# Patient Record
Sex: Female | Born: 1995 | Hispanic: Yes | Marital: Single | Smoking: Former smoker
Health system: Southern US, Community
[De-identification: ages and names within clinical notes are randomized; demographics above are authoritative.]

## PROBLEM LIST (undated history)

## (undated) ENCOUNTER — Inpatient Hospital Stay: Admission: EM | Payer: Self-pay

## (undated) DIAGNOSIS — Z8041 Family history of malignant neoplasm of ovary: Secondary | ICD-10-CM

## (undated) DIAGNOSIS — R569 Unspecified convulsions: Secondary | ICD-10-CM

## (undated) DIAGNOSIS — O24419 Gestational diabetes mellitus in pregnancy, unspecified control: Secondary | ICD-10-CM

## (undated) DIAGNOSIS — I10 Essential (primary) hypertension: Secondary | ICD-10-CM

## (undated) DIAGNOSIS — Z789 Other specified health status: Secondary | ICD-10-CM

## (undated) DIAGNOSIS — R55 Syncope and collapse: Secondary | ICD-10-CM

## (undated) DIAGNOSIS — G932 Benign intracranial hypertension: Secondary | ICD-10-CM

## (undated) HISTORY — PX: FOOT SURGERY: SHX648

## (undated) HISTORY — DX: Gestational diabetes mellitus in pregnancy, unspecified control: O24.419

## (undated) HISTORY — DX: Other specified health status: Z78.9

## (undated) HISTORY — PX: ABDOMINAL SURGERY: SHX537

## (undated) HISTORY — DX: Unspecified convulsions: R56.9

## (undated) HISTORY — DX: Essential (primary) hypertension: I10

## (undated) HISTORY — PX: FOOT GANGLION EXCISION: SHX1660

## (undated) HISTORY — DX: Family history of malignant neoplasm of ovary: Z80.41

---

## 2017-06-04 ENCOUNTER — Encounter: Payer: Self-pay | Admitting: Emergency Medicine

## 2017-06-04 ENCOUNTER — Other Ambulatory Visit: Payer: Self-pay

## 2017-06-04 DIAGNOSIS — N6452 Nipple discharge: Secondary | ICD-10-CM | POA: Insufficient documentation

## 2017-06-04 DIAGNOSIS — N611 Abscess of the breast and nipple: Secondary | ICD-10-CM | POA: Insufficient documentation

## 2017-06-04 DIAGNOSIS — F17210 Nicotine dependence, cigarettes, uncomplicated: Secondary | ICD-10-CM | POA: Insufficient documentation

## 2017-06-04 DIAGNOSIS — R112 Nausea with vomiting, unspecified: Secondary | ICD-10-CM | POA: Insufficient documentation

## 2017-06-04 LAB — URINALYSIS, COMPLETE (UACMP) WITH MICROSCOPIC
BACTERIA UA: NONE SEEN
Bilirubin Urine: NEGATIVE
Glucose, UA: NEGATIVE mg/dL
HGB URINE DIPSTICK: NEGATIVE
Ketones, ur: NEGATIVE mg/dL
LEUKOCYTES UA: NEGATIVE
Nitrite: NEGATIVE
PROTEIN: NEGATIVE mg/dL
Specific Gravity, Urine: 1.003 — ABNORMAL LOW (ref 1.005–1.030)
pH: 6 (ref 5.0–8.0)

## 2017-06-04 LAB — CBC
HEMATOCRIT: 40.4 % (ref 35.0–47.0)
Hemoglobin: 13.8 g/dL (ref 12.0–16.0)
MCH: 30.2 pg (ref 26.0–34.0)
MCHC: 34 g/dL (ref 32.0–36.0)
MCV: 88.8 fL (ref 80.0–100.0)
Platelets: 317 10*3/uL (ref 150–440)
RBC: 4.55 MIL/uL (ref 3.80–5.20)
RDW: 12.7 % (ref 11.5–14.5)
WBC: 12.4 10*3/uL — AB (ref 3.6–11.0)

## 2017-06-04 LAB — COMPREHENSIVE METABOLIC PANEL
ALBUMIN: 4.3 g/dL (ref 3.5–5.0)
ALT: 14 U/L (ref 14–54)
AST: 19 U/L (ref 15–41)
Alkaline Phosphatase: 86 U/L (ref 38–126)
Anion gap: 7 (ref 5–15)
BILIRUBIN TOTAL: 0.4 mg/dL (ref 0.3–1.2)
BUN: 15 mg/dL (ref 6–20)
CHLORIDE: 102 mmol/L (ref 101–111)
CO2: 27 mmol/L (ref 22–32)
Calcium: 8.8 mg/dL — ABNORMAL LOW (ref 8.9–10.3)
Creatinine, Ser: 0.55 mg/dL (ref 0.44–1.00)
GFR calc Af Amer: >60 mL/min (ref >60)
GFR calc non Af Amer: >60 mL/min (ref >60)
GLUCOSE: 105 mg/dL — AB (ref 65–99)
Potassium: 4 mmol/L (ref 3.5–5.1)
Sodium: 136 mmol/L (ref 135–145)
TOTAL PROTEIN: 7.7 g/dL (ref 6.5–8.1)

## 2017-06-04 LAB — POCT PREGNANCY, URINE: Preg Test, Ur: NEGATIVE

## 2017-06-04 NOTE — ED Triage Notes (Addendum)
Patient ambulatory to triage with steady gait, without difficulty or distress noted; began having heavy vag menstrual bleeding on Monday with cramping; st periods are irregular

## 2017-06-05 ENCOUNTER — Emergency Department
Admission: EM | Admit: 2017-06-05 | Discharge: 2017-06-05 | Disposition: A | Discharge status: Home / Self Care | Payer: Self-pay | Attending: Emergency Medicine | Admitting: Emergency Medicine

## 2017-06-05 ENCOUNTER — Emergency Department: Payer: Self-pay

## 2017-06-05 DIAGNOSIS — N6452 Nipple discharge: Secondary | ICD-10-CM

## 2017-06-05 DIAGNOSIS — R112 Nausea with vomiting, unspecified: Secondary | ICD-10-CM

## 2017-06-05 DIAGNOSIS — N61 Mastitis without abscess: Secondary | ICD-10-CM

## 2017-06-05 DIAGNOSIS — R109 Unspecified abdominal pain: Secondary | ICD-10-CM

## 2017-06-05 LAB — LIPASE, BLOOD: Lipase: 25 U/L (ref 11–51)

## 2017-06-05 MED ORDER — ONDANSETRON 4 MG PO TBDP: 4.0000 mg | ORAL_TABLET | Freq: Three times a day (TID) | ORAL | 0 refills | Status: DC | PRN

## 2017-06-05 MED ORDER — AMOXICILLIN-POT CLAVULANATE 875-125 MG PO TABS: 1.0000 | ORAL_TABLET | Freq: Two times a day (BID) | ORAL | 0 refills | Status: AC

## 2017-06-05 MED ORDER — SODIUM CHLORIDE 0.9 % IV BOLUS
1000.0000 mL | Freq: Once | INTRAVENOUS | Status: AC
  Administered 2017-06-05: 1000 mL via INTRAVENOUS

## 2017-06-05 MED ORDER — AMOXICILLIN-POT CLAVULANATE 875-125 MG PO TABS
1.0000 | ORAL_TABLET | Freq: Once | ORAL | Status: AC
  Administered 2017-06-05: 1 via ORAL
  Filled 2017-06-05: qty 1

## 2017-06-05 MED ORDER — ONDANSETRON HCL 4 MG/2ML IJ SOLN
4.0000 mg | Freq: Once | INTRAMUSCULAR | Status: AC
  Administered 2017-06-05: 4 mg via INTRAVENOUS
  Filled 2017-06-05: qty 2

## 2017-06-05 NOTE — ED Notes (Signed)
Patient transported to Ultrasound 

## 2017-06-05 NOTE — ED Provider Notes (Signed)
[facility] Emergency Department Provider Note   ____________________________________________   First MD Initiated Contact with Patient 06/05/17 0131     (approximate)  I have reviewed the triage vital signs and the nursing notes.   HISTORY  Chief Complaint Vaginal Bleeding    HPI Audrey Munoz is a 22 y.o. female who comes into the hospital today with some left breast discharge.  She states that she got her menstrual cycle on Monday.  It was very heavy and the patient states she had pain around her ovaries.  She had the symptoms for 2 days and then started having vomiting and shortness of breath on Wednesday which was 2 days ago.  The bleeding continued.  Today the bleeding stopped but the patient started having some discharge from her left breast.  It looked like water and a little greenish.  The patient states that her breast is hard.  She has been pregnant before.  The patient denies any more abdominal pain and again she is no longer bleeding.  She does not have an OB/GYN.  She denies fevers.  The patient has had cyst on her ovaries but does not know which side.  She is feeling fatigued and she did vomit in the waiting room as well.  Her last menstrual period was in March but she does have a regular cycle.  The patient is here today for evaluation  History reviewed. No pertinent past medical history.  There are no active problems to display for this patient.   Past Surgical History:  Procedure Laterality Date  . FOOT SURGERY      Prior to Admission medications   Medication Sig Start Date End Date Taking? Authorizing Provider  amoxicillin-clavulanate (AUGMENTIN) 875-125 MG tablet Take 1 tablet by mouth 2 (two) times daily for 7 days. 06/05/17 06/12/17  Rebecka Apley, MD  ondansetron (ZOFRAN ODT) 4 MG disintegrating tablet Take 1 tablet (4 mg total) by mouth every 8 (eight) hours as needed for nausea or vomiting. 06/05/17   Rebecka Apley, MD      Allergies Patient has no known allergies.  No family history on file.  Social History Social History   Tobacco Use  . Smoking status: Current Every Day Smoker  . Smokeless tobacco: Never Used  Substance Use Topics  . Alcohol use: Not on file  . Drug use: Not on file    Review of Systems  Constitutional: No fever/chills Eyes: No visual changes. ENT: No sore throat. Cardiovascular: Denies chest pain. Respiratory: Denies shortness of breath. Gastrointestinal:  abdominal pain, nausea, vomiting.  No diarrhea.  No constipation. Genitourinary: vaginal bleeding Musculoskeletal: Negative for back pain. Skin: left breast discharge Neurological: Negative for headaches, focal weakness or numbness.   ____________________________________________   PHYSICAL EXAM:  VITAL SIGNS: ED Triage Vitals  Enc Vitals Group     BP 06/04/17 2039 (!) 142/75     Pulse Rate 06/04/17 2039 83     Resp 06/05/17 0203 18     Temp 06/04/17 2039 98.2 F (36.8 C)     Temp Source 06/04/17 2039 Oral     SpO2 06/04/17 2039 98 %     Weight 06/04/17 2039 140 lb (63.5 kg)     Height 06/04/17 2039 5' (1.524 m)     Head Circumference --      Peak Flow --      Pain Score 06/04/17 2039 4     Pain Loc --  Pain Edu? --      Excl. in GC? --     Constitutional: Alert and oriented. Well appearing and in mild distress. Eyes: Conjunctivae are normal. PERRL. EOMI. Head: Atraumatic. Nose: No congestion/rhinnorhea. Mouth/Throat: Mucous membranes are moist.  Oropharynx non-erythematous. Cardiovascular: Normal rate, regular rhythm. Grossly normal heart sounds.  Good peripheral circulation. Respiratory: Normal respiratory effort.  No retractions. Lungs CTAB. Gastrointestinal: Soft and nontender. No distention.  Positive bowel sounds Musculoskeletal: No lower extremity tenderness nor edema.   Neurologic:  Normal speech and language.  Skin:  Skin is warm, dry and intact. No discharge expressed from left  breast, no masses palpated bilaterally Psychiatric: Mood and affect are normal.   ____________________________________________   LABS (all labs ordered are listed, but only abnormal results are displayed)  Labs Reviewed  URINALYSIS, COMPLETE (UACMP) WITH MICROSCOPIC - Abnormal; Notable for the following components:      Result Value   Color, Urine COLORLESS (*)    APPearance CLEAR (*)    Specific Gravity, Urine 1.003 (*)    All other components within normal limits  CBC - Abnormal; Notable for the following components:   WBC 12.4 (*)    All other components within normal limits  COMPREHENSIVE METABOLIC PANEL - Abnormal; Notable for the following components:   Glucose, Bld 105 (*)    Calcium 8.8 (*)    All other components within normal limits  LIPASE, BLOOD  POCT PREGNANCY, URINE   ____________________________________________  EKG  none ____________________________________________  RADIOLOGY  ED MD interpretation:  US pelvis: Normal pelvic ultrasound, no evidence for torsion or other acute abnormality  Official radiology report(s): US Pelvis Transvanginal Non-ob (tv Only)  Result Date: 06/05/2017 CLINICAL DATA:  Initial evaluation for acute lower abdominal pain with cramping, vaginal bleeding. EXAM: TRANSABDOMINAL AND TRANSVAGINAL ULTRASOUND OF PELVIS DOPPLER ULTRASOUND OF OVARIES TECHNIQUE: Both transabdominal and transvaginal ultrasound examinations of the pelvis were performed. Transabdominal technique was performed for global imaging of the pelvis including uterus, ovaries, adnexal regions, and pelvic cul-de-sac. It was necessary to proceed with endovaginal exam following the transabdominal exam to visualize the uterus, endometrium, and ovaries. Color and duplex Doppler ultrasound was utilized to evaluate blood flow to the ovaries. COMPARISON:  None. FINDINGS: Uterus Measurements: 6.2 x 3.4 x 4.2 cm. No fibroids or other mass visualized. Endometrium Thickness: 3.6 mm.  No  focal abnormality visualized. Right ovary Measurements: 2.7 x 1.8 x 2.9 cm. Normal appearance/no adnexal mass. Left ovary Measurements: 3.7 x 2.3 x 2.8 cm. Normal appearance/no adnexal mass. Pulsed Doppler evaluation of both ovaries demonstrates normal low-resistance arterial and venous waveforms. Other findings No abnormal free fluid. IMPRESSION: Normal pelvic ultrasound. No evidence for torsion or other acute abnormality. Electronically Signed   By: Rise Mu M.D.   On: 06/05/2017 03:55   US Pelvis Complete  Result Date: 06/05/2017 CLINICAL DATA:  Initial evaluation for acute lower abdominal pain with cramping, vaginal bleeding. EXAM: TRANSABDOMINAL AND TRANSVAGINAL ULTRASOUND OF PELVIS DOPPLER ULTRASOUND OF OVARIES TECHNIQUE: Both transabdominal and transvaginal ultrasound examinations of the pelvis were performed. Transabdominal technique was performed for global imaging of the pelvis including uterus, ovaries, adnexal regions, and pelvic cul-de-sac. It was necessary to proceed with endovaginal exam following the transabdominal exam to visualize the uterus, endometrium, and ovaries. Color and duplex Doppler ultrasound was utilized to evaluate blood flow to the ovaries. COMPARISON:  None. FINDINGS: Uterus Measurements: 6.2 x 3.4 x 4.2 cm. No fibroids or other mass visualized. Endometrium Thickness: 3.6 mm.  No focal  abnormality visualized. Right ovary Measurements: 2.7 x 1.8 x 2.9 cm. Normal appearance/no adnexal mass. Left ovary Measurements: 3.7 x 2.3 x 2.8 cm. Normal appearance/no adnexal mass. Pulsed Doppler evaluation of both ovaries demonstrates normal low-resistance arterial and venous waveforms. Other findings No abnormal free fluid. IMPRESSION: Normal pelvic ultrasound. No evidence for torsion or other acute abnormality. Electronically Signed   By: Rise Mu M.D.   On: 06/05/2017 03:55   US Abdominal Pelvic Art/vent Flow Doppler  Result Date: 06/05/2017 CLINICAL DATA:   Initial evaluation for acute lower abdominal pain with cramping, vaginal bleeding. EXAM: TRANSABDOMINAL AND TRANSVAGINAL ULTRASOUND OF PELVIS DOPPLER ULTRASOUND OF OVARIES TECHNIQUE: Both transabdominal and transvaginal ultrasound examinations of the pelvis were performed. Transabdominal technique was performed for global imaging of the pelvis including uterus, ovaries, adnexal regions, and pelvic cul-de-sac. It was necessary to proceed with endovaginal exam following the transabdominal exam to visualize the uterus, endometrium, and ovaries. Color and duplex Doppler ultrasound was utilized to evaluate blood flow to the ovaries. COMPARISON:  None. FINDINGS: Uterus Measurements: 6.2 x 3.4 x 4.2 cm. No fibroids or other mass visualized. Endometrium Thickness: 3.6 mm.  No focal abnormality visualized. Right ovary Measurements: 2.7 x 1.8 x 2.9 cm. Normal appearance/no adnexal mass. Left ovary Measurements: 3.7 x 2.3 x 2.8 cm. Normal appearance/no adnexal mass. Pulsed Doppler evaluation of both ovaries demonstrates normal low-resistance arterial and venous waveforms. Other findings No abnormal free fluid. IMPRESSION: Normal pelvic ultrasound. No evidence for torsion or other acute abnormality. Electronically Signed   By: Rise Mu M.D.   On: 06/05/2017 03:55    ____________________________________________   PROCEDURES  Procedure(s) performed: None  Procedures  Critical Care performed: No  ____________________________________________   INITIAL IMPRESSION / ASSESSMENT AND PLAN / ED COURSE  As part of my medical decision making, I reviewed the following data within the electronic MEDICAL RECORD NUMBER Notes from prior ED visits and [place] Controlled Substance Database   This is a 22 year old female who comes into the hospital today with discharge from her left breast as well as some remote abdominal pain and vaginal bleeding.  The patient is also having nausea and vomiting.  My differential  diagnosis includes ovarian cyst, infection of the breast  We did check some blood work on the patient and the patient's white blood cell count is 12.4 but the remaining CBC CMP and urinalysis are unremarkable.  The patient's pregnancy is negative.  I will send her for an ultrasound of her pelvis looking for cysts.  She will be reassessed she is not in any pain at this time.  We did not perform a pelvic exam as the pain was improved and she no longer had any bleeding.  The patient's ultrasound is unremarkable.  I will treat the patient's breast drainage for infection and nonlactational mastitis.  The patient will receive a dose of Augmentin and she will be discharged to follow-up with OB/GYN for further evaluation.      ____________________________________________   FINAL CLINICAL IMPRESSION(S) / ED DIAGNOSES  Final diagnoses:  Nausea and vomiting  Abdominal pain  Breast discharge  Breast infection     ED Discharge Orders        Ordered    amoxicillin-clavulanate (AUGMENTIN) 875-125 MG tablet  2 times daily     06/05/17 0422    ondansetron (ZOFRAN ODT) 4 MG disintegrating tablet  Every 8 hours PRN     06/05/17 0422       Note:  This document was prepared  using Conservation officer, historic buildings and may include unintentional dictation errors.    Rebecka Apley, MD 06/05/17 [phone]

## 2017-06-15 ENCOUNTER — Ambulatory Visit (INDEPENDENT_AMBULATORY_CARE_PROVIDER_SITE_OTHER): Payer: Self-pay | Admitting: Obstetrics and Gynecology

## 2017-06-15 ENCOUNTER — Other Ambulatory Visit
Admission: RE | Admit: 2017-06-15 | Discharge: 2017-06-15 | Disposition: A | Discharge status: Home / Self Care | Payer: Self-pay | Source: Ambulatory Visit | Attending: Obstetrics and Gynecology | Admitting: Obstetrics and Gynecology

## 2017-06-15 ENCOUNTER — Encounter: Payer: Self-pay | Admitting: Obstetrics and Gynecology

## 2017-06-15 VITALS — BP 110/70 | HR 74 | Ht 63.0 in | Wt 159.0 lb

## 2017-06-15 DIAGNOSIS — Z711 Person with feared health complaint in whom no diagnosis is made: Secondary | ICD-10-CM

## 2017-06-15 DIAGNOSIS — N939 Abnormal uterine and vaginal bleeding, unspecified: Secondary | ICD-10-CM

## 2017-06-15 DIAGNOSIS — R102 Pelvic and perineal pain unspecified side: Secondary | ICD-10-CM

## 2017-06-15 DIAGNOSIS — N61 Mastitis without abscess: Secondary | ICD-10-CM

## 2017-06-15 DIAGNOSIS — N926 Irregular menstruation, unspecified: Secondary | ICD-10-CM | POA: Insufficient documentation

## 2017-06-15 LAB — POCT URINE PREGNANCY: Preg Test, Ur: NEGATIVE

## 2017-06-15 LAB — TSH: TSH: 0.669 u[IU]/mL (ref 0.350–4.500)

## 2017-06-15 MED ORDER — DESOGESTREL-ETHINYL ESTRADIOL 0.15-30 MG-MCG PO TABS: 1.0000 | ORAL_TABLET | Freq: Every day | ORAL | 11 refills | Status: DC

## 2017-06-15 NOTE — Progress Notes (Incomplete)
   Patient ID: Audrey Munoz, female   DOB: 03-22-1995, 22 y.o.   MRN: [id number]  Reason for Consult: Follow-up (ER f/u breast discharge)   Referred by No ref. provider found  Subjective:     HPI:  Audrey Munoz is a 22 y.o. female she is being followed for breast nipple discharge/ mastitis, pelvic painand abnormal periods  Past Medical History:  Diagnosis Date  . No known health problems    Family History  Family history unknown: Yes   Past Surgical History:  Procedure Laterality Date  . FOOT SURGERY      Short Social History:  Social History   Tobacco Use  . Smoking status: Current Every Day Smoker    Types: Cigarettes  . Smokeless tobacco: Never Used  . Tobacco comment: 3 per day  Substance Use Topics  . Alcohol use: Never    Frequency: Never    No Known Allergies  Current Outpatient Medications  Medication Sig Dispense Refill  . ondansetron (ZOFRAN ODT) 4 MG disintegrating tablet Take 1 tablet (4 mg total) by mouth every 8 (eight) hours as needed for nausea or vomiting. (Patient not taking: Reported on 06/15/2017) 20 tablet 0   No current facility-administered medications for this visit.     REVIEW OF SYSTEMS      Objective:  Objective   Vitals:   06/15/17 1338  BP: 110/70  Pulse: 74  Weight: 159 lb (72.1 kg)  Height:  (1.6 m)   Body mass index is 28.17 kg/m.  Physical Exam  Data: ***     Assessment/Plan:     ***     Natale Milch MD [facility]

## 2017-06-15 NOTE — Progress Notes (Signed)
   Patient ID: Catalyna Reilly, female   DOB: [birth year], 22 y.o.   MRN: [id number]  Reason for Consult: Follow-up (ER f/u breast discharge)   Referred by No ref. provider found  Subjective:     HPI:  Dewanna Hurston is a 22 y.o. female breast nipple discharge, pelvic pain and abnormal periods.    Past Medical History:  Diagnosis Date  . No known health problems    Family History  Family history unknown: Yes   Past Surgical History:  Procedure Laterality Date  . FOOT SURGERY      Short Social History:  Social History   Tobacco Use  . Smoking status: Current Every Day Smoker    Types: Cigarettes  . Smokeless tobacco: Never Used  . Tobacco comment: 3 per day  Substance Use Topics  . Alcohol use: Never    Frequency: Never    No Known Allergies  Current Outpatient Medications  Medication Sig Dispense Refill  . desogestrel-ethinyl estradiol (APRI) 0.15-30 MG-MCG tablet Take 1 tablet by mouth daily. 1 Package 11  . ondansetron (ZOFRAN ODT) 4 MG disintegrating tablet Take 1 tablet (4 mg total) by mouth every 8 (eight) hours as needed for nausea or vomiting. (Patient not taking: Reported on 06/15/2017) 20 tablet 0   No current facility-administered medications for this visit.     REVIEW OF SYSTEMS      Objective:  Objective   Vitals:   06/15/17 1338  BP: 110/70  Pulse: 74  Weight: 159 lb (72.1 kg)  Height:  (1.6 m)   Body mass index is 28.17 kg/m.  Physical Exam      Assessment/Plan:    22 yo  breast nipple discharge, pelvic pain and abnormal periods. Labs today Will initiate OCP Follow up in 1-3 months   Adelene Idler MD [facility], [facility] 07/01/17 4:50 PM

## 2017-06-16 LAB — GC/CHLAMYDIA PROBE AMP
CHLAMYDIA, DNA PROBE: NEGATIVE
Neisseria gonorrhoeae by PCR: NEGATIVE

## 2017-06-16 LAB — FSH/LH
FSH: 4.7 m[IU]/mL
LH: 11.9 m[IU]/mL

## 2017-06-16 LAB — DHEA-SULFATE: DHEA-SO4: 312.3 ug/dL (ref 110.0–431.7)

## 2017-06-16 LAB — SEX HORMONE BINDING GLOBULIN: SEX HORMONE BINDING: 54.1 nmol/L (ref 24.6–122.0)

## 2017-06-16 LAB — TESTOSTERONE,FREE AND TOTAL
TESTOSTERONE: 54 ng/dL — AB (ref 8–48)
Testosterone, Free: 2.8 pg/mL (ref 0.0–4.2)

## 2017-06-16 LAB — PROLACTIN: PROLACTIN: 11.4 ng/mL (ref 4.8–23.3)

## 2018-08-04 NOTE — Progress Notes (Signed)
Chart abstraction done per Telehealth interview with Tawanna Solo, RN; Debera Lat, RN

## 2018-08-05 ENCOUNTER — Other Ambulatory Visit: Payer: Self-pay | Admitting: Family Medicine

## 2018-08-05 ENCOUNTER — Other Ambulatory Visit: Payer: Self-pay

## 2018-08-05 ENCOUNTER — Encounter: Payer: Self-pay | Admitting: Physician Assistant

## 2018-08-05 ENCOUNTER — Ambulatory Visit: Payer: Medicaid Other | Admitting: Physician Assistant

## 2018-08-05 VITALS — BP 116/67 | Temp 97.5°F | Wt 169.2 lb

## 2018-08-05 DIAGNOSIS — E66811 Obesity, class 1: Secondary | ICD-10-CM

## 2018-08-05 DIAGNOSIS — O0990 Supervision of high risk pregnancy, unspecified, unspecified trimester: Secondary | ICD-10-CM | POA: Insufficient documentation

## 2018-08-05 DIAGNOSIS — Z3480 Encounter for supervision of other normal pregnancy, unspecified trimester: Secondary | ICD-10-CM

## 2018-08-05 DIAGNOSIS — O99212 Obesity complicating pregnancy, second trimester: Secondary | ICD-10-CM

## 2018-08-05 DIAGNOSIS — O219 Vomiting of pregnancy, unspecified: Secondary | ICD-10-CM | POA: Insufficient documentation

## 2018-08-05 DIAGNOSIS — E669 Obesity, unspecified: Secondary | ICD-10-CM

## 2018-08-05 DIAGNOSIS — O99213 Obesity complicating pregnancy, third trimester: Secondary | ICD-10-CM | POA: Insufficient documentation

## 2018-08-05 LAB — HEMOGLOBIN, FINGERSTICK: Hemoglobin: 13.1 g/dL (ref 11.1–15.9)

## 2018-08-05 LAB — URINALYSIS
Bilirubin, UA: POSITIVE — AB
Glucose, UA: NEGATIVE
Leukocytes,UA: NEGATIVE
Nitrite, UA: NEGATIVE
RBC, UA: NEGATIVE
Specific Gravity, UA: 1.03 (ref 1.005–1.030)
Urobilinogen, Ur: 0.2 mg/dL (ref 0.2–1.0)
pH, UA: 5.5 (ref 5.0–7.5)

## 2018-08-05 LAB — OB RESULTS CONSOLE HIV ANTIBODY (ROUTINE TESTING): HIV: NONREACTIVE

## 2018-08-05 LAB — WET PREP FOR TRICH, YEAST, CLUE
Trichomonas Exam: NEGATIVE
Yeast Exam: NEGATIVE

## 2018-08-05 MED ORDER — PROMETHAZINE HCL 25 MG PO TABS: 25.0000 mg | ORAL_TABLET | Freq: Three times a day (TID) | ORAL | 0 refills | Status: DC | PRN

## 2018-08-05 NOTE — Progress Notes (Signed)
Fundal height difficult to assess due to habitus - possibly at level of symphysis pubis.

## 2018-08-05 NOTE — Progress Notes (Signed)
[place] [id number] [phone]  INITIAL PRENATAL VISIT NOTE  Subjective:  Audrey Munoz is a 23 y.o. G3P0020 at [redacted]w[redacted]d being seen today to start prenatal care at the [facility].  She is currently monitored for the following issues for this low-risk pregnancy and has Supervision of other normal pregnancy, antepartum; Obesity (BMI 30.0-34.9); Obesity affecting pregnancy; and Nausea and vomiting during pregnancy prior to [redacted] weeks gestation on their problem list.  Patient reports nausea and frequent vomiting for 4 days. Drinking well, but foods don't stay down. Also has mild pelvic discomfort, but no dysuria or frequency. Has had scant white vag discharge for 2 days without odor or itch.   .  .   . denies leaking of fluid.   The following portions of the patient's history were reviewed and updated as appropriate: allergies, current medications, past family history, past medical history, past social history, past surgical history and problem list. Problem list updated.  Objective:   Vitals:   08/05/18 1323  BP: 116/67  Temp: (!) 97.5 F (36.4 C)  Weight: 169 lb 3.2 oz (76.7 kg)    Fetal Status:           Physical Exam Vitals signs and nursing note reviewed.  Constitutional:      Appearance: She is obese.  HENT:     Head: Normocephalic and atraumatic.     Right Ear: Tympanic membrane, ear canal and external ear normal.     Left Ear: Tympanic membrane, ear canal and external ear normal.     Nose: Nose normal.     Mouth/Throat:     Pharynx: Oropharynx is clear.  Eyes:     Extraocular Movements: Extraocular movements intact.     Conjunctiva/sclera: Conjunctivae normal.  Cardiovascular:     Rate and Rhythm: Normal rate and regular rhythm.     Pulses: Normal pulses.     Heart sounds: Normal heart sounds.  Pulmonary:     Effort: Pulmonary effort is normal.      Breath sounds: Normal breath sounds.  Abdominal:     Palpations: Abdomen is soft.  Genitourinary:    General: Normal vulva.     Vagina: Vaginal discharge present.     Rectum: Normal.     Comments: Sm amt white adherent vag d/c without odor, pH <4.5. Musculoskeletal: Normal range of motion.  Skin:    General: Skin is warm.  Neurological:     General: No focal deficit present.     Mental Status: She is alert and oriented to person, place, and time.  Psychiatric:        Mood and Affect: Mood normal.        Behavior: Behavior normal.        Thought Content: Thought content normal.        Judgment: Judgment normal.     Assessment and Plan:  Pregnancy: G3P0020 at [redacted]w[redacted]d  1. Supervision of other normal pregnancy, antepartum Schedule [place] for viability/dating. romethazine today, enc to take 30 min prior to visit. - Hemoglobin, venipuncture - HIV [place] [facility] - IGP, rfx Aptima HPV ASCU - WET PREP FOR TRICH, YEAST, CLUE - Urinalysis (Urine Dip) - RPR - HGB FRAC. W/SOLUBILITY - Hgb A1c w/o eAG - Comprehensive metabolic panel - Protein / creatinine ratio, urine  (Spot) - TSH - Urine Culture - Chlamydia/GC NAA, Confirmation - QuantiFERON-TB Gold Plus - promethazine (PHENERGAN)  25 MG tablet; Take 1 tablet (25 mg total) by mouth every 8 (eight) hours as needed for nausea or vomiting.  Dispense: 15 tablet; Refill: 0  2. Obesity (BMI 30.0-34.9) See below  3. Obesity affecting pregnancy in second trimester Refer to MNT due to BMI 30.3 and start aspirin 81mg  qd.  - no 1 hr today due to NV - Hgb A1c w/o eAG - Comprehensive metabolic panel  4. Nausea and vomiting during pregnancy prior to [redacted] weeks gestation Unable to tolerate [facility] today due to N/V. Plan to have pt return in 1 wk for f/u N/V and for [facility]. Given  - promethazine (PHENERGAN) 25 MG tablet; Take 1 tablet (25 mg total) by mouth every 8 (eight) hours as needed for nausea or vomiting.  Dispense: 15 tablet;  Refill: 0   Discussed overview of care and coordination with inpatient delivery practices [facility], [facility], [facility].   Reviewed Centering pregnancy as standard of care at [facility], oriented to room and showed video. Declines CP.   Preterm labor symptoms and general obstetric precautions including but not limited to vaginal bleeding, contractions, leaking of fluid and fetal movement were reviewed in detail with the patient.  Please refer to After Visit Summary for other counseling recommendations.   Return in about 1 week (around 08/12/2018) for follow up N/V.  No future appointments.  Landry DykeAnnamarie , PA-C

## 2018-08-05 NOTE — Progress Notes (Signed)
Here for new Ob appt. Is not currently taking PNV consistently due to n/v. Denies ED/hospital visits since +PT. Declines 1hgtt today due to ongoing nausea. Provider A. Streilein, PA-C notified.

## 2018-08-06 ENCOUNTER — Telehealth: Payer: Self-pay

## 2018-08-06 LAB — PROTEIN / CREATININE RATIO, URINE
Creatinine, Urine: 301.4 mg/dL
Protein, Ur: 18.9 mg/dL
Protein/Creat Ratio: 63 mg/g creat (ref 0–200)

## 2018-08-06 NOTE — Telephone Encounter (Signed)
Phone call to patient to inform of scheduled ultrasound appt. For 08/12/2018 2:30 (2:15) at [facility]. Instructions given for patient to arrive by 2:15 with a full bladder for check in at [facility] entrance. Patient verbalized understanding of same. Language line interpreter ID [phone] utilized for above call.

## 2018-08-07 LAB — URINE CULTURE

## 2018-08-09 ENCOUNTER — Other Ambulatory Visit: Payer: Self-pay

## 2018-08-09 ENCOUNTER — Encounter: Payer: Self-pay | Admitting: Family Medicine

## 2018-08-09 ENCOUNTER — Emergency Department: Payer: BC Managed Care – PPO

## 2018-08-09 ENCOUNTER — Emergency Department
Admission: EM | Admit: 2018-08-09 | Discharge: 2018-08-09 | Disposition: A | Discharge status: Home / Self Care | Payer: BC Managed Care – PPO | Attending: Emergency Medicine | Admitting: Emergency Medicine

## 2018-08-09 DIAGNOSIS — Z7982 Long term (current) use of aspirin: Secondary | ICD-10-CM | POA: Diagnosis not present

## 2018-08-09 DIAGNOSIS — Z3A11 11 weeks gestation of pregnancy: Secondary | ICD-10-CM | POA: Diagnosis not present

## 2018-08-09 DIAGNOSIS — Z87891 Personal history of nicotine dependence: Secondary | ICD-10-CM | POA: Diagnosis not present

## 2018-08-09 DIAGNOSIS — O99511 Diseases of the respiratory system complicating pregnancy, first trimester: Secondary | ICD-10-CM | POA: Diagnosis not present

## 2018-08-09 DIAGNOSIS — R0602 Shortness of breath: Secondary | ICD-10-CM | POA: Diagnosis not present

## 2018-08-09 DIAGNOSIS — R7989 Other specified abnormal findings of blood chemistry: Secondary | ICD-10-CM | POA: Insufficient documentation

## 2018-08-09 DIAGNOSIS — O219 Vomiting of pregnancy, unspecified: Secondary | ICD-10-CM | POA: Diagnosis present

## 2018-08-09 DIAGNOSIS — Z79899 Other long term (current) drug therapy: Secondary | ICD-10-CM | POA: Diagnosis not present

## 2018-08-09 DIAGNOSIS — Z20828 Contact with and (suspected) exposure to other viral communicable diseases: Secondary | ICD-10-CM | POA: Insufficient documentation

## 2018-08-09 DIAGNOSIS — O26899 Other specified pregnancy related conditions, unspecified trimester: Secondary | ICD-10-CM

## 2018-08-09 LAB — CBC WITH DIFFERENTIAL/PLATELET
Abs Immature Granulocytes: 0.05 10*3/uL (ref 0.00–0.07)
Basophils Absolute: 0 10*3/uL (ref 0.0–0.1)
Basophils Relative: 0 %
Eosinophils Absolute: 0 10*3/uL (ref 0.0–0.5)
Eosinophils Relative: 0 %
HCT: 38 % (ref 36.0–46.0)
Hemoglobin: 12.7 g/dL (ref 12.0–15.0)
Immature Granulocytes: 0 %
Lymphocytes Relative: 19 %
Lymphs Abs: 2.3 10*3/uL (ref 0.7–4.0)
MCH: 29.6 pg (ref 26.0–34.0)
MCHC: 33.4 g/dL (ref 30.0–36.0)
MCV: 88.6 fL (ref 80.0–100.0)
Monocytes Absolute: 0.6 10*3/uL (ref 0.1–1.0)
Monocytes Relative: 5 %
Neutro Abs: 9.3 10*3/uL — ABNORMAL HIGH (ref 1.7–7.7)
Neutrophils Relative %: 76 %
Platelets: 293 10*3/uL (ref 150–400)
RBC: 4.29 MIL/uL (ref 3.87–5.11)
RDW: 12.1 % (ref 11.5–15.5)
WBC: 12.3 10*3/uL — ABNORMAL HIGH (ref 4.0–10.5)
nRBC: 0 % (ref 0.0–0.2)

## 2018-08-09 LAB — URINALYSIS, COMPLETE (UACMP) WITH MICROSCOPIC
Bacteria, UA: NONE SEEN
Bilirubin Urine: NEGATIVE
Glucose, UA: NEGATIVE mg/dL
Hgb urine dipstick: NEGATIVE
Ketones, ur: 80 mg/dL — AB
Leukocytes,Ua: NEGATIVE
Nitrite: NEGATIVE
Protein, ur: 30 mg/dL — AB
Specific Gravity, Urine: 1.035 — ABNORMAL HIGH (ref 1.005–1.030)
pH: 6 (ref 5.0–8.0)

## 2018-08-09 LAB — TSH: TSH: 0.276 u[IU]/mL — ABNORMAL LOW (ref 0.450–4.500)

## 2018-08-09 LAB — COMPREHENSIVE METABOLIC PANEL
ALT: 33 IU/L — ABNORMAL HIGH (ref 0–32)
ALT: 27 U/L (ref 0–44)
AST: 18 IU/L (ref 0–40)
AST: 19 U/L (ref 15–41)
Albumin/Globulin Ratio: 1.7 (ref 1.2–2.2)
Albumin: 4.3 g/dL (ref 3.9–5.0)
Albumin: 4.1 g/dL (ref 3.5–5.0)
Alkaline Phosphatase: 75 IU/L (ref 39–117)
Alkaline Phosphatase: 62 U/L (ref 38–126)
Anion gap: 13 (ref 5–15)
BUN/Creatinine Ratio: 16 (ref 9–23)
BUN: 8 mg/dL (ref 6–20)
BUN: 8 mg/dL (ref 6–20)
Bilirubin Total: 0.2 mg/dL (ref 0.0–1.2)
CO2: 21 mmol/L (ref 20–29)
CO2: 22 mmol/L (ref 22–32)
Calcium: 9.6 mg/dL (ref 8.7–10.2)
Calcium: 9.3 mg/dL (ref 8.9–10.3)
Chloride: 101 mmol/L (ref 96–106)
Chloride: 101 mmol/L (ref 98–111)
Creatinine, Ser: 0.5 mg/dL — ABNORMAL LOW (ref 0.57–1.00)
Creatinine, Ser: 0.42 mg/dL — ABNORMAL LOW (ref 0.44–1.00)
GFR calc Af Amer: 156 mL/min/{1.73_m2} (ref >59)
GFR calc Af Amer: >60 mL/min (ref >60)
GFR calc non Af Amer: 135 mL/min/{1.73_m2} (ref >59)
GFR calc non Af Amer: >60 mL/min (ref >60)
Globulin, Total: 2.6 g/dL (ref 1.5–4.5)
Glucose, Bld: 89 mg/dL (ref 70–99)
Glucose: 86 mg/dL (ref 65–99)
Potassium: 4.1 mmol/L (ref 3.5–5.2)
Potassium: 3.8 mmol/L (ref 3.5–5.1)
Sodium: 137 mmol/L (ref 134–144)
Sodium: 136 mmol/L (ref 135–145)
Total Bilirubin: 0.4 mg/dL (ref 0.3–1.2)
Total Protein: 6.9 g/dL (ref 6.0–8.5)
Total Protein: 7.4 g/dL (ref 6.5–8.1)

## 2018-08-09 LAB — QUANTIFERON-TB GOLD PLUS
QuantiFERON Mitogen Value: >10 IU/mL
QuantiFERON Nil Value: 0.01 IU/mL
QuantiFERON TB1 Ag Value: 0.02 IU/mL
QuantiFERON TB2 Ag Value: 0.01 IU/mL
QuantiFERON-TB Gold Plus: NEGATIVE

## 2018-08-09 LAB — CBC/D/PLT+RPR+RH+ABO+RUB AB...
Antibody Screen: NEGATIVE
Basophils Absolute: 0 10*3/uL (ref 0.0–0.2)
Basos: 0 %
EOS (ABSOLUTE): 0 10*3/uL (ref 0.0–0.4)
Eos: 0 %
Hematocrit: 38.7 % (ref 34.0–46.6)
Hemoglobin: 12.9 g/dL (ref 11.1–15.9)
Hepatitis B Surface Ag: NEGATIVE
Immature Grans (Abs): 0 10*3/uL (ref 0.0–0.1)
Immature Granulocytes: 0 %
Lymphocytes Absolute: 2.1 10*3/uL (ref 0.7–3.1)
Lymphs: 20 %
MCH: 30.1 pg (ref 26.6–33.0)
MCHC: 33.3 g/dL (ref 31.5–35.7)
MCV: 90 fL (ref 79–97)
Monocytes Absolute: 0.6 10*3/uL (ref 0.1–0.9)
Monocytes: 6 %
Neutrophils Absolute: 7.9 10*3/uL — ABNORMAL HIGH (ref 1.4–7.0)
Neutrophils: 74 %
Platelets: 288 10*3/uL (ref 150–450)
RBC: 4.29 x10E6/uL (ref 3.77–5.28)
RDW: 12.5 % (ref 11.7–15.4)
RPR Ser Ql: NONREACTIVE
Rh Factor: POSITIVE
Rubella Antibodies, IGG: 1.42 index (ref >0.99)
Varicella zoster IgG: 462 index (ref >165)
WBC: 10.7 10*3/uL (ref 3.4–10.8)

## 2018-08-09 LAB — HGB FRAC. W/SOLUBILITY
Hgb A2 Quant: 2.4 % (ref 1.8–3.2)
Hgb A: 97.6 % (ref 96.4–98.8)
Hgb C: 0 %
Hgb F Quant: 0 % (ref 0.0–2.0)
Hgb S: 0 %
Hgb Solubility: NEGATIVE
Hgb Variant: 0 %

## 2018-08-09 LAB — IGP, RFX APTIMA HPV ASCU: PAP Smear Comment: 0

## 2018-08-09 LAB — HGB A1C W/O EAG: Hgb A1c MFr Bld: 5.4 % (ref 4.8–5.6)

## 2018-08-09 LAB — CHLAMYDIA/GC NAA, CONFIRMATION
Chlamydia trachomatis, NAA: NEGATIVE
Neisseria gonorrhoeae, NAA: NEGATIVE

## 2018-08-09 LAB — HCG, QUANTITATIVE, PREGNANCY: hCG, Beta Chain, Quant, S: 212405 m[IU]/mL — ABNORMAL HIGH (ref <5)

## 2018-08-09 LAB — LEAD, BLOOD (ADULT >= 16 YRS): Lead-Whole Blood: NOT DETECTED ug/dL (ref 0–4)

## 2018-08-09 MED ORDER — SODIUM CHLORIDE 0.9 % IV BOLUS
1000.0000 mL | Freq: Once | INTRAVENOUS | Status: AC
  Administered 2018-08-09: 15:00:00 1000 mL via INTRAVENOUS

## 2018-08-09 NOTE — ED Provider Notes (Signed)
[facility] Emergency Department Provider Note  ____________________________________________  Time seen: Approximately 2:04 PM  I have reviewed the triage vital signs and the nursing notes.   HISTORY  Chief Complaint Emesis During Pregnancy    HPI Audrey Munoz is a 23 y.o. female who presents to the emergency department for treatment and evaluation of shortness of breath, nausea, vomiting, and abdominal cramping. She is [redacted] weeks pregnant. Symptoms started this morning. She has vomited 5 times. No diarrhea. No dysuria.    No relief with Phenergan.  Past Medical History:  Diagnosis Date  . No known health problems   . Seizures ([place])    Seizures as a child with ?med    Patient Active Problem List   Diagnosis Date Noted  . Low TSH level 08/09/2018  . Supervision of other normal pregnancy, antepartum 08/05/2018  . Obesity (BMI 30.0-34.9) 08/05/2018  . Obesity affecting pregnancy 08/05/2018  . Nausea and vomiting during pregnancy prior to [redacted] weeks gestation 08/05/2018    Past Surgical History:  Procedure Laterality Date  . FOOT GANGLION EXCISION     Cyst removed from foot  . FOOT SURGERY      Prior to Admission medications   Medication Sig Start Date End Date Taking? Authorizing Provider  aspirin EC 81 MG tablet Take 81 mg by mouth daily.    [provider]  Prenatal Vit-Fe Fumarate-FA (MULTIVITAMIN-PRENATAL) 27-0.8 MG TABS tablet Take 1 tablet by mouth daily at 12 noon.    [provider]  promethazine (PHENERGAN) 25 MG tablet Take 1 tablet (25 mg total) by mouth every 8 (eight) hours as needed for nausea or vomiting. 08/05/18   Lora Havens, PA-C    Allergies Patient has no known allergies.  Family History  Problem Relation Age of Onset  . Asthma Father   . Seizures Sister   . Ovarian cancer Maternal Grandmother   . Cancer - Ovarian Maternal Grandmother   . Cancer Maternal Grandmother     Social  History Social History   Tobacco Use  . Smoking status: Former Smoker    Packs/day: 0.50    Types: Cigarettes    Quit date: 07/16/2018    Years since quitting: 0.0  . Smokeless tobacco: Never Used  . Tobacco comment: 3 per day  Substance Use Topics  . Alcohol use: Not Currently    Frequency: Never    Comment: Last ETOH 03/12/18  . Drug use: Never    Review of Systems Constitutional: Negative for fever. Respiratory: Positive for shortness of breath. Negative for cough. Gastrointestinal: Positive for abdominal pain; positive for nausea , positive for vomiting. Genitourinary: Negative for dysuria , negative for vaginal discharge. Negative for vaginal bleeding. Musculoskeletal: Negative for back pain. Skin: Negative for acute skin changes/rash/lesion. ____________________________________________   PHYSICAL EXAM:  VITAL SIGNS: ED Triage Vitals  Enc Vitals Group     BP 08/09/18 1123 132/77     Pulse Rate 08/09/18 1123 92     Resp 08/09/18 1123 18     Temp 08/09/18 1123 97.8 F (36.6 C)     Temp Source 08/09/18 1123 Oral     SpO2 08/09/18 1123 99 %     Weight 08/09/18 1124 169 lb (76.7 kg)     Height 08/09/18 1124 5\' 3"  (1.6 m)     Head Circumference --      Peak Flow --      Pain Score 08/09/18 1124 8     Pain Loc --  Pain Edu? --      Excl. in GC? --     Constitutional: Alert and oriented. Well appearing and in no acute distress. Eyes: Conjunctivae are normal. Head: Atraumatic. Nose: No congestion/rhinnorhea. Mouth/Throat: Mucous membranes are moist. Respiratory: Normal respiratory effort.  No retractions. Gastrointestinal: Bowel sounds active x 4; Abdomen is soft without rebound or guarding. Genitourinary: Pelvic exam: not indicated. Musculoskeletal: No extremity tenderness nor edema.  Neurologic:  Normal speech and language. No gross focal neurologic deficits are appreciated. Speech is normal. No gait instability. Skin:  Skin is warm, dry and intact. No rash  noted on exposed skin. Psychiatric: Mood and affect are normal. Speech and behavior are normal.  ____________________________________________   LABS (all labs ordered are listed, but only abnormal results are displayed)  Labs Reviewed  CBC WITH DIFFERENTIAL/PLATELET - Abnormal; Notable for the following components:      Result Value   WBC 12.3 (*)    Neutro Abs 9.3 (*)    All other components within normal limits  COMPREHENSIVE METABOLIC PANEL - Abnormal; Notable for the following components:   Creatinine, Ser 0.42 (*)    All other components within normal limits  URINALYSIS, COMPLETE (UACMP) WITH MICROSCOPIC - Abnormal; Notable for the following components:   Color, Urine AMBER (*)    APPearance HAZY (*)    Specific Gravity, Urine 1.035 (*)    Ketones, ur 80 (*)    Protein, ur 30 (*)    All other components within normal limits  HCG, QUANTITATIVE, PREGNANCY - Abnormal; Notable for the following components:   hCG, Beta Chain, Quant, S 212,405 (*)    All other components within normal limits  NOVEL CORONAVIRUS, NAA (HOSPITAL ORDER, SEND-OUT TO REF LAB)   ____________________________________________  RADIOLOGY  Ultrasound shows fetal heart rate 171.  There is a single live IUP with a gestation of approximately 8 weeks and 5 days.  There is a small subchorionic hemorrhage noted. ____________________________________________  Procedures  ____________________________________________  23 year old female gravida 3 para 1 presenting to the emergency department with symptoms as above.  Plan will be to draw labs, get a urinalysis, beta-hCG and then send her for ultrasound.  Because she is also having some very mild shortness of breath with exertion and has had nausea and vomiting I will also order a send out COVID 19 test.  After 1 L of fluid and Zofran, patient's nausea has resolved.  She still complains of some upper abdominal discomfort but denies any back pain or abdominal  cramping.  She states that the discomfort has been there for a couple of days but has gotten worse since she vomited.  Ultrasound is measuring baby at 8 weeks 5 days.  I discussed this with the patient and she states that her menstrual cycle has been very irregular and she is not really sure when she had her last.  She has not yet had an ultrasound at the health department but is scheduled to be seen on Thursday.  No further vomiting while here.  I have advised the patient that she should quarantine herself for the next 24 to 48 hours while her COVID test is pending.  If it is negative, she is to continue with her plan to see the gynecologist at the health department on Thursday.  If the COVID test is positive, she will need to quarantine herself for the next 2 weeks.  She has Phenergan to take for nausea and was encouraged to do so as needed.  No  additional medications will be prescribed today.  If she has worsening abdominal pain, worsening shortness of breath, or begins to have vaginal discharge or bleeding she is to return to the emergency department.  All questions were answered and information was relayed via video interpreter service.  INITIAL IMPRESSION / ASSESSMENT AND PLAN / ED COURSE  Pertinent labs & imaging results that were available during my care of the patient were reviewed by me and considered in my medical decision making (see chart for details).  ____________________________________________   FINAL CLINICAL IMPRESSION(S) / ED DIAGNOSES  Final diagnoses:  Nausea and vomiting in pregnancy  Shortness of breath    Note:  This document was prepared using Dragon voice recognition software and may include unintentional dictation errors.   Chinita Pesterriplett, Kuzey Ogata B, FNP 08/09/18 1817    Sharman CheekStafford, Phillip, MD 08/11/18 [phone]

## 2018-08-09 NOTE — Discharge Instructions (Signed)
Continue phenergan. I did not send any medication to the pharmacy since you have phenergan.

## 2018-08-09 NOTE — ED Triage Notes (Signed)
Pt states she is [redacted] weeks pregnant and has been having issues with N/v with the pregnancy today emesis x5 .

## 2018-08-09 NOTE — ED Notes (Signed)
See triage note  Presents with some n/v   States she is [redacted] weeks pregnant   Denies any vaginal bleeding but having some lower abd pressure   F/u with [facility]

## 2018-08-10 LAB — NOVEL CORONAVIRUS, NAA (HOSP ORDER, SEND-OUT TO REF LAB; TAT 18-24 HRS): SARS-CoV-2, NAA: NOT DETECTED

## 2018-08-11 ENCOUNTER — Telehealth: Payer: Self-pay | Admitting: Emergency Medicine

## 2018-08-11 NOTE — Telephone Encounter (Signed)
Called patient with language line interpretter [phone] and informed of negative covid 19 result.

## 2018-08-12 ENCOUNTER — Other Ambulatory Visit: Payer: Self-pay

## 2018-08-12 ENCOUNTER — Telehealth: Payer: Self-pay | Admitting: Family Medicine

## 2018-08-12 ENCOUNTER — Ambulatory Visit
Admission: RE | Admit: 2018-08-12 | Discharge: 2018-08-12 | Disposition: A | Discharge status: Home / Self Care | Payer: Medicaid Other | Source: Ambulatory Visit | Attending: Physician Assistant | Admitting: Physician Assistant

## 2018-08-12 DIAGNOSIS — Z3480 Encounter for supervision of other normal pregnancy, unspecified trimester: Secondary | ICD-10-CM

## 2018-08-12 NOTE — Telephone Encounter (Signed)
Attempted to call pt. Regarding med--no answer, left voicemail message Debera Lat, RN

## 2018-08-12 NOTE — Telephone Encounter (Signed)
Patient wants more anti-nausea pills

## 2018-08-13 ENCOUNTER — Ambulatory Visit: Payer: Self-pay

## 2018-08-18 ENCOUNTER — Encounter: Payer: Self-pay | Admitting: Family Medicine

## 2018-08-18 DIAGNOSIS — O209 Hemorrhage in early pregnancy, unspecified: Secondary | ICD-10-CM

## 2018-08-18 NOTE — Progress Notes (Signed)
Reviewed [place] from [facility] ER on 7/13 which showed earlier than expected pregnancy.

## 2018-08-20 ENCOUNTER — Other Ambulatory Visit: Payer: Self-pay

## 2018-08-20 ENCOUNTER — Ambulatory Visit: Payer: Medicaid Other | Admitting: Family Medicine

## 2018-08-20 VITALS — BP 124/75 | Temp 98.6°F | Wt 173.8 lb

## 2018-08-20 DIAGNOSIS — O209 Hemorrhage in early pregnancy, unspecified: Secondary | ICD-10-CM

## 2018-08-20 DIAGNOSIS — O219 Vomiting of pregnancy, unspecified: Secondary | ICD-10-CM

## 2018-08-20 DIAGNOSIS — Z3480 Encounter for supervision of other normal pregnancy, unspecified trimester: Secondary | ICD-10-CM

## 2018-08-20 DIAGNOSIS — O99212 Obesity complicating pregnancy, second trimester: Secondary | ICD-10-CM

## 2018-08-20 DIAGNOSIS — R7989 Other specified abnormal findings of blood chemistry: Secondary | ICD-10-CM

## 2018-08-20 MED ORDER — PROMETHAZINE HCL 25 MG PO TABS: 25.0000 mg | ORAL_TABLET | Freq: Three times a day (TID) | ORAL | 1 refills | Status: DC | PRN

## 2018-08-20 NOTE — Progress Notes (Signed)
In for visit; 1 hr gtt today Debera Lat, RN

## 2018-08-20 NOTE — Progress Notes (Addendum)
   PRENATAL VISIT NOTE  Subjective:  Audrey Munoz is a 23 y.o. G3P0020 at [redacted]w[redacted]d being seen today for ongoing prenatal care.  She is currently monitored for the following issues for this low-risk pregnancy and has Supervision of other normal pregnancy, antepartum; Obesity (BMI 30.0-34.9); Obesity affecting pregnancy; Nausea and vomiting during pregnancy prior to [redacted] weeks gestation; Low TSH level; and Vaginal bleeding in pregnancy, first trimester on their problem list.  Patient reports no complaints.  Contractions: Not present. Vag. Bleeding: None.  Movement: Absent. Denies leaking of fluid/ROM.   The following portions of the patient's history were reviewed and updated as appropriate: allergies, current medications, past family history, past medical history, past social history, past surgical history and problem list. Problem list updated.  Objective:   Vitals:   08/20/18 0818  BP: 124/75  Temp: 98.6 F (37 C)  Weight: 173 lb 12.8 oz (78.8 kg)    Fetal Status: Fetal Heart Rate (bpm): not   Movement: Absent     General:  Alert, oriented and cooperative. Patient is in no acute distress.  Skin: Skin is warm and dry. No rash noted.   Cardiovascular: Normal heart rate noted  Respiratory: Normal respiratory effort, no problems with respiration noted  Abdomen: Soft, gravid, appropriate for gestational age.  Pain/Pressure: Absent     Pelvic: Cervical exam deferred        Extremities: Normal range of motion.  Edema: None  Mental Status: Normal mood and affect. Normal behavior. Normal judgment and thought content.   Assessment and Plan:  Pregnancy: [id number] at [redacted]w[redacted]d  1. Vaginal bleeding in pregnancy, first trimester None currently  2. Low TSH level Recheck today  3. Supervision of other normal pregnancy, antepartum Up to date Desires [facility] genetics visit, form filled out today and placed order in epic Next appointment in person due to not hearing FHT today  4. Obesity  affecting pregnancy in second trimester Completing gtt today  5. Nausea and vomiting during pregnancy prior to [redacted] weeks gestation Improving RX for phenergan sent to pharmacy on file   Preterm labor symptoms and general obstetric precautions including but not limited to vaginal bleeding, contractions, leaking of fluid and fetal movement were reviewed in detail with the patient. Please refer to After Visit Summary for other counseling recommendations.  Return in about 4 weeks (around 09/17/2018) for Routine prenatal care.  Future Appointments  Date Time Provider [facility]  09/17/2018  8:20 AM [facility] PROVIDER [facility] None    Caren Macadam, MD

## 2018-08-20 NOTE — Addendum Note (Signed)
Addended by: Caryl Bis on: 08/20/2018 03:30 PM   Modules accepted: Orders

## 2018-08-21 LAB — TSH: TSH: 0.079 u[IU]/mL — ABNORMAL LOW (ref 0.450–4.500)

## 2018-08-21 LAB — GLUCOSE, 1 HOUR GESTATIONAL: Gestational Diabetes Screen: 157 mg/dL — ABNORMAL HIGH (ref 65–139)

## 2018-08-23 ENCOUNTER — Other Ambulatory Visit: Payer: Self-pay | Admitting: Family Medicine

## 2018-08-23 ENCOUNTER — Telehealth: Payer: Self-pay

## 2018-08-23 DIAGNOSIS — O99810 Abnormal glucose complicating pregnancy: Secondary | ICD-10-CM | POA: Insufficient documentation

## 2018-08-23 DIAGNOSIS — Z369 Encounter for antenatal screening, unspecified: Secondary | ICD-10-CM

## 2018-08-23 NOTE — Telephone Encounter (Signed)
Call to client-discussed abnl 1 hr gtt and scheduled 3 Hr gtt 08/27/18 @ 8:30; aware to be fasting and reviewed testing Debera Lat, RN Interpreted by Jerilynn Mages. [place]

## 2018-08-27 ENCOUNTER — Other Ambulatory Visit: Payer: Self-pay

## 2018-08-27 ENCOUNTER — Other Ambulatory Visit: Payer: Medicaid Other

## 2018-08-27 DIAGNOSIS — Z3481 Encounter for supervision of other normal pregnancy, first trimester: Secondary | ICD-10-CM

## 2018-08-27 DIAGNOSIS — O99810 Abnormal glucose complicating pregnancy: Secondary | ICD-10-CM

## 2018-08-27 NOTE — Progress Notes (Signed)
Pt given instructions for 3 hour GTT and has times to return to lab. Last time she ate or drank anything other than small sips of water was at 8 pm last night.

## 2018-08-28 LAB — GLUCOSE TOLERANCE TEST, 6 HOUR
Glucose, 1 Hour GTT: 134 mg/dL (ref 65–199)
Glucose, 2 hour: 119 mg/dL (ref 65–139)
Glucose, 3 hour: 103 mg/dL (ref 65–109)
Glucose, GTT - Fasting: 87 mg/dL (ref 65–99)

## 2018-08-30 ENCOUNTER — Telehealth: Payer: Self-pay | Admitting: Family Medicine

## 2018-08-30 NOTE — Telephone Encounter (Signed)
TC to patient regarding pain. Patient complains of nausea and vomiting and pain in her tailbone area. Patient denies bleeding, denies discharge, denies burning/urgency with urinating. States she has been vomiting "a lot", and was working on her feet a lot before the pain started on Saturday. Patient also mentioned having ovary pain, and denies cramping. Patient counseled to try Tylenol and referred to the safe medication sheet in her new OB packet, for Tylenol instructions and nausea management options. Patient counseled to eat small, high protein snacks frequently during the day. Patient counseled to try Tylenol, heat and/or ice for the pain in her tailbone and to call back if no relief after several days. Patient agrees to plan. Interpreter V. [place].Jenetta Downer, RN

## 2018-08-31 ENCOUNTER — Telehealth: Payer: Self-pay | Admitting: Dietician

## 2018-08-31 ENCOUNTER — Telehealth: Payer: Self-pay

## 2018-08-31 NOTE — Telephone Encounter (Signed)
Per written order of Jerline Pain FNP-BC, client needs T3 and T4 as soon as available. Phone call to [employer] and unable to add to previously collected 3 hour GTT (due to type of specimen tube required). Phone call to client who needs Friday appt due to work. Appt scheduled for 09/03/2018 and client to arrive at 8:00 am. Shona Needles, RN

## 2018-09-01 ENCOUNTER — Telehealth: Payer: Self-pay

## 2018-09-01 NOTE — Telephone Encounter (Signed)
Client notified of [facility] first trimester screen appt on 09/09/2018 at  1 pm (genetic counseling) and 2 pm ([place]). Aware to arrive at 12:45 pm with mask on and verbal directions to facility provided .Shona Needles, RN

## 2018-09-03 ENCOUNTER — Other Ambulatory Visit: Payer: Self-pay

## 2018-09-03 ENCOUNTER — Other Ambulatory Visit: Payer: Medicaid Other

## 2018-09-03 DIAGNOSIS — Z3480 Encounter for supervision of other normal pregnancy, unspecified trimester: Secondary | ICD-10-CM | POA: Diagnosis not present

## 2018-09-03 NOTE — Progress Notes (Signed)
Per Epic phone note, T3 and T4 drawn today in lab. Aileen Fass, RN

## 2018-09-04 LAB — T4: T4, Total: 10.6 ug/dL (ref 4.5–12.0)

## 2018-09-04 LAB — T3: T3, Total: 254 ng/dL — ABNORMAL HIGH (ref 71–180)

## 2018-09-09 ENCOUNTER — Other Ambulatory Visit: Payer: Self-pay

## 2018-09-09 ENCOUNTER — Ambulatory Visit
Admission: RE | Admit: 2018-09-09 | Discharge: 2018-09-09 | Disposition: A | Discharge status: Home / Self Care | Payer: Medicaid Other | Source: Ambulatory Visit | Attending: Obstetrics and Gynecology | Admitting: Obstetrics and Gynecology

## 2018-09-09 ENCOUNTER — Ambulatory Visit: Payer: Medicaid Other

## 2018-09-09 DIAGNOSIS — Z36 Encounter for antenatal screening for chromosomal anomalies: Secondary | ICD-10-CM | POA: Diagnosis not present

## 2018-09-09 NOTE — Progress Notes (Signed)
Virtual Visit via Telephone Note  I connected with Audrey Munoz on 09/09/2018 at 11:45am by telephone and verified that I am speaking with the correct person using two identifiers. I utilized a Research officer, trade unionpanish interpreter through [employer] for this consultation.  [facility]  [facility] Length of consultation:  45 minutes  Audrey Munoz [facility] for genetic counseling to review prenatal screening and testing options.  This note summarizes the information we discussed.    We offered the following routine screening tests for this pregnancy:  The most accurate screening option for chromosome conditions is cell free fetal DNA testing.  Though this is typically reserved for pregnancies at increased risk for aneuploidy, it is currently being made available and many insurance companies are adding coverage for this testing in low risk patients during COVID.  This test utilizes a maternal blood sample and DNA sequencing technology to isolate circulating cell free fetal DNA from maternal plasma.  The fetal DNA can then be analyzed for DNA sequences that are derived from the three most common chromosomes involved in aneuploidy, chromosomes 13, 18, and 21.  If the overall amount of DNA is greater than the expected level for any of these chromosomes, aneuploidy is suspected.  The detection rates are >99% for Down syndrome, >98% for trisomy 18 and >91% for Trisomy 13.  While we do not consider it a replacement for invasive testing and karyotype analysis, a negative result from this testing would be reassuring, though not a guarantee of a normal chromosome complement for the baby.  An abnormal result may be suggestive of an abnormal chromosome complement, though we would still recommend CVS or amniocentesis to confirm any findings from this testing.  First trimester screening, which includes nuchal translucency ultrasound screen and  first trimester maternal serum marker screening, is the test that has most recently been available for low risk patients.  The nuchal translucency has approximately an 80% detection rate for Down syndrome and can be positive for other chromosome abnormalities as well as congenital heart defects.  When combined with a maternal serum marker screening, the detection rate is up to 90% for Down syndrome and up to 97% for trisomy 18.   Given current recommendations during COVID, we are offering only the biochemical testing portion of this testing (without the ultrasound and NT portion), which has a much lower detection rate.  Maternal serum marker screening, or "quad" screen, is a blood test that measures pregnancy proteins, can provide risk assessments for Down syndrome, trisomy 18, and open neural tube defects (spina bifida, anencephaly). Because it does not directly examine the fetus, it cannot positively diagnose or rule out these problems. This is a second trimester option which could be offered along with the anatomy ultrasound. It can detect approximately 75% of babies with Down syndrome, 80% of babies with open spina bifida and 70% of babies with trisomy 2618.  Targeted ultrasound uses high frequency sound waves to create an image of the developing fetus.  An ultrasound is often recommended as a routine means of evaluating the pregnancy.  It is also used to screen for fetal anatomy problems (for example, a heart defect) that might be suggestive of a chromosomal or other abnormality. We are currently not recommending a first trimester ultrasound other than that which would be ordered for dating and viability.  Should these screening tests indicate an increased concern, then the following additional testing options would be offered:  The chorionic villus sampling procedure is available for first trimester chromosome analysis.  This involves the withdrawal of a small amount of chorionic villi (tissue from the  developing placenta).  Risk of pregnancy loss is estimated to be approximately 1 in 200 to 1 in 100 (0.5 to 1%).  There is approximately a 1% (1 in 100) chance that the CVS chromosome results will be unclear.  Chorionic villi cannot be tested for neural tube defects.     Amniocentesis involves the removal of a small amount of amniotic fluid from the sac surrounding the fetus with the use of a thin needle inserted through the maternal abdomen and uterus.  Ultrasound guidance is used throughout the procedure.  Fetal cells from amniotic fluid are directly evaluated and > 99.5% of chromosome problems and > 98% of open neural tube defects can be detected. This procedure is generally performed after the 15th week of pregnancy.  The main risks to this procedure include complications leading to miscarriage in less than 1 in 200 cases (0.5%).  Cystic Fibrosis and Spinal Muscular Atrophy (SMA) screening were also discussed with the patient. Both conditions are recessive, which means that both parents must be carriers in order to have a child with the disease.  Cystic fibrosis (CF) is one of the most common genetic conditions in persons of Caucasian ancestry.  This condition occurs in approximately 1 in 2,500 Caucasian persons and results in thickened secretions in the lungs, digestive, and reproductive systems.  For a baby to be at risk for having CF, both of the parents must be carriers for this condition.  Approximately 1 in 6725 Caucasian persons is a carrier for CF.  Current carrier testing looks for the most common mutations in the gene for CF and can detect approximately 90% of carriers in the Caucasian population.  This means that the carrier screening can greatly reduce, but cannot eliminate, the chance for an individual to have a child with CF.  If an individual is found to be a carrier for CF, then carrier testing would be available for the partner. As part of [place] newborn screening profile, all babies  born in the state of [place] will have a two-tier screening process.  Specimens are first tested to determine the concentration of immunoreactive trypsinogen (IRT).  The top 5% of specimens with the highest IRT values then undergo DNA testing using a panel of over 40 common CF mutations. SMA is a neurodegenerative disorder that leads to atrophy of skeletal muscle and overall weakness.  This condition is also more prevalent in the Caucasian population, with 1 in 40-1 in 60 persons being a carrier and 1 in 6,000-1 in 10,000 children being affected.  There are multiple forms of the disease, with some causing death in infancy to other forms with survival into adulthood.  The genetics of SMA is complex, but carrier screening can detect up to 95% of carriers in the Caucasian population.  Similar to CF, a negative result can greatly reduce, but cannot eliminate, the chance to have a child with SMA. The patient declined carrier screening for CF and SMA.  We talked about the option of signing up for Early Check to have the baby tested for SMA after delivery as part of a new study in [place].  This registration can be done online prior to delivery if desired.   Hemoglobinopathy screening was already performed at [facility] and was normal (AA, MCV 88.6).  We obtained a detailed family history and  pregnancy history.  The family history was reported to be unremarkable for birth defects, intellectual delays, recurrent pregnancy loss or known chromosome abnormalities. Ms.  Serin Munoz stated that she had seizures as a child associated with extreme nervousness.  She has not had any seizures nor been medicated for this history since childhood.  We reviewed that there may be many reasons for seizures and that in approximately 80% of cases, there is no clearly identified cause.  Though this pregnancy may be at slightly increased risk for seizures, given the history and the lack of other health concerns to suggest a genetic syndrome or  other family members with seizures, it is expected to be a low chance.  The patient reported no complications or exposures in this pregnancy that would be expected to increase the risk for birth defects.  She had an ultrasound on 08/09/2018 which confirmed the gestational age to be 8 weeks, 5 days with an EDC of 03/16/2019.  Due to COVID recommendations, we would not plan another ultrasound until [redacted] weeks gestation.  After consideration of the options, Audrey Munoz elected to have [place] with SCA, CF and SMA carrier screening drawn at a [facility].  These labs were ordered on 09/09/2018 and will be called to the patient when they are available, usually 1-2 weeks following blood draw.  This will minimize the number of visits that she needs to attend in person.  She is scheduled for an anatomy ultrasound at [facility] on 10/14/2018 at 1:00pm.  The patient was encouraged to call with questions or concerns.  We can be contacted at [phone].  Labs ordered: [id number] PLUS with SCA, SMA and CF carrier screening  Wilburt Finlay, MS, CGC  I provided 45 minutes of non-face-to-face time during this encounter.   Donette Larry

## 2018-09-13 ENCOUNTER — Emergency Department
Admission: EM | Admit: 2018-09-13 | Discharge: 2018-09-13 | Disposition: A | Discharge status: Home / Self Care | Payer: Medicaid Other | Attending: Emergency Medicine | Admitting: Emergency Medicine

## 2018-09-13 ENCOUNTER — Other Ambulatory Visit: Payer: Self-pay

## 2018-09-13 ENCOUNTER — Encounter: Payer: Self-pay | Admitting: Emergency Medicine

## 2018-09-13 DIAGNOSIS — Z20828 Contact with and (suspected) exposure to other viral communicable diseases: Secondary | ICD-10-CM | POA: Diagnosis not present

## 2018-09-13 DIAGNOSIS — Z7982 Long term (current) use of aspirin: Secondary | ICD-10-CM | POA: Insufficient documentation

## 2018-09-13 DIAGNOSIS — Z3A00 Weeks of gestation of pregnancy not specified: Secondary | ICD-10-CM | POA: Diagnosis not present

## 2018-09-13 DIAGNOSIS — G44209 Tension-type headache, unspecified, not intractable: Secondary | ICD-10-CM | POA: Insufficient documentation

## 2018-09-13 DIAGNOSIS — O9989 Other specified diseases and conditions complicating pregnancy, childbirth and the puerperium: Secondary | ICD-10-CM | POA: Diagnosis not present

## 2018-09-13 DIAGNOSIS — R519 Headache, unspecified: Secondary | ICD-10-CM

## 2018-09-13 DIAGNOSIS — Z87891 Personal history of nicotine dependence: Secondary | ICD-10-CM | POA: Diagnosis not present

## 2018-09-13 LAB — CBC WITH DIFFERENTIAL/PLATELET
Abs Immature Granulocytes: 0.06 10*3/uL (ref 0.00–0.07)
Basophils Absolute: 0 10*3/uL (ref 0.0–0.1)
Basophils Relative: 0 %
Eosinophils Absolute: 0.1 10*3/uL (ref 0.0–0.5)
Eosinophils Relative: 1 %
HCT: 35.3 % — ABNORMAL LOW (ref 36.0–46.0)
Hemoglobin: 12.1 g/dL (ref 12.0–15.0)
Immature Granulocytes: 1 %
Lymphocytes Relative: 16 %
Lymphs Abs: 1.7 10*3/uL (ref 0.7–4.0)
MCH: 29.7 pg (ref 26.0–34.0)
MCHC: 34.3 g/dL (ref 30.0–36.0)
MCV: 86.7 fL (ref 80.0–100.0)
Monocytes Absolute: 0.6 10*3/uL (ref 0.1–1.0)
Monocytes Relative: 5 %
Neutro Abs: 8.1 10*3/uL — ABNORMAL HIGH (ref 1.7–7.7)
Neutrophils Relative %: 77 %
Platelets: 265 10*3/uL (ref 150–400)
RBC: 4.07 MIL/uL (ref 3.87–5.11)
RDW: 12 % (ref 11.5–15.5)
WBC: 10.5 10*3/uL (ref 4.0–10.5)
nRBC: 0 % (ref 0.0–0.2)

## 2018-09-13 LAB — COMPREHENSIVE METABOLIC PANEL
ALT: 24 U/L (ref 0–44)
AST: 26 U/L (ref 15–41)
Albumin: 3.4 g/dL — ABNORMAL LOW (ref 3.5–5.0)
Alkaline Phosphatase: 55 U/L (ref 38–126)
Anion gap: 8 (ref 5–15)
BUN: 6 mg/dL (ref 6–20)
CO2: 22 mmol/L (ref 22–32)
Calcium: 9.1 mg/dL (ref 8.9–10.3)
Chloride: 105 mmol/L (ref 98–111)
Creatinine, Ser: 0.48 mg/dL (ref 0.44–1.00)
GFR calc Af Amer: >60 mL/min (ref >60)
GFR calc non Af Amer: >60 mL/min (ref >60)
Glucose, Bld: 99 mg/dL (ref 70–99)
Potassium: 3.8 mmol/L (ref 3.5–5.1)
Sodium: 135 mmol/L (ref 135–145)
Total Bilirubin: 0.5 mg/dL (ref 0.3–1.2)
Total Protein: 7.2 g/dL (ref 6.5–8.1)

## 2018-09-13 LAB — URINALYSIS, COMPLETE (UACMP) WITH MICROSCOPIC
Bacteria, UA: NONE SEEN
Bilirubin Urine: NEGATIVE
Glucose, UA: NEGATIVE mg/dL
Hgb urine dipstick: NEGATIVE
Ketones, ur: 20 mg/dL — AB
Leukocytes,Ua: NEGATIVE
Nitrite: NEGATIVE
Protein, ur: NEGATIVE mg/dL
Specific Gravity, Urine: 1.01 (ref 1.005–1.030)
pH: 8 (ref 5.0–8.0)

## 2018-09-13 LAB — SARS CORONAVIRUS 2 BY RT PCR (HOSPITAL ORDER, PERFORMED IN ~~LOC~~ HOSPITAL LAB): SARS Coronavirus 2: NEGATIVE

## 2018-09-13 LAB — GROUP A STREP BY PCR: Group A Strep by PCR: NOT DETECTED

## 2018-09-13 MED ORDER — ACETAMINOPHEN 325 MG PO TABS
650.0000 mg | ORAL_TABLET | Freq: Once | ORAL | Status: AC
  Administered 2018-09-13: 650 mg via ORAL
  Filled 2018-09-13: qty 2

## 2018-09-13 MED ORDER — ONDANSETRON HCL 4 MG PO TABS: 4.0000 mg | ORAL_TABLET | Freq: Three times a day (TID) | ORAL | 0 refills | Status: DC | PRN

## 2018-09-13 MED ORDER — LACTATED RINGERS IV BOLUS
2000.0000 mL | Freq: Once | INTRAVENOUS | Status: AC
  Administered 2018-09-13: 2000 mL via INTRAVENOUS

## 2018-09-13 NOTE — ED Provider Notes (Addendum)
[facility] Emergency Department Provider Note  ____________________________________________   I have reviewed the triage vital signs and the nursing notes. Where available I have reviewed prior notes and, if possible and indicated, outside hospital notes.   Patient seen and evaluated during the coronavirus epidemic during a time with low staffing  Patient seen for the symptoms described in the history of present illness. She was evaluated in the context of the global COVID-19 pandemic, which necessitated consideration that the patient might be at risk for infection with the SARS-CoV-2 virus that causes COVID-19. Institutional protocols and algorithms that pertain to the evaluation of patients at risk for COVID-19 are in a state of rapid change based on information released by regulatory bodies including the CDC and federal and state organizations. These policies and algorithms were followed during the patient's care in the ED.    Patient speaks Spanish, she is very comfortable with my Spanish she does not want me to call for an interpreter at this time, she states that she changes her mind she will let me know.  She feels that there are no barriers to communication between us at this time  HISTORY  Chief Complaint Headache    HPI Audrey Munoz is a 23 y.o. female who is healthy, does have history of tobacco abuse prior to becoming pregnant, she is G2 P0 with a prior TAB, who is pregnant, does have prior ultrasound which shows an IUP, has had hyperemesis symptoms this entire pregnancy.  She is vomited every day since she became pregnant.  She is also had loose stools off and on since then as well.  She has [facility] including likely just, however, she still has nausea and vomiting.  She states that gradually a week ago she started with a low level headache "when it was really hot out side" and that has gradually progressed.  She does not  think she is keeping up on her fluids.  This is not the worst headache of life.  Gradual in onset.  She has no focal numbness or weakness she has no fever or chills she has no stiff neck.  She states gradually over the last week and she feels more dehydrated the headache is gotten worse.  She also complains of a sore throat.  She thinks her throat is sore from vomiting, is been sore off and on during her pregnancy when she has been vomiting.  She is does not have any hematemesis melena or bright red blood per rectum.  She is denying any abdominal pain.  She denies any vaginal discharge.  No vaginal bleeding.  No pregnancy related complaints.  She is here because she feels dehydrated She has taken no medication for the headache.  No other alleviating or aggravating factors.  She would like a work note.  No family history of aneurysms or significant headache pathology.  No photophobia, generalized, nonfocal.  No radiation.  No stiff neck    Past Medical History:  Diagnosis Date  . No known health problems   . Seizures (HCC)    Seizures as a child with ?med    Patient Active Problem List   Diagnosis Date Noted  . Encounter for antenatal screening for chromosomal anomalies   . Abnormal glucose tolerance test during pregnancy, antepartum 08/23/2018  . Vaginal bleeding in pregnancy, first trimester 08/18/2018  . Low TSH level 08/09/2018  . Supervision of other normal pregnancy, antepartum 08/05/2018  . Obesity (BMI  30.0-34.9) 08/05/2018  . Obesity affecting pregnancy 08/05/2018  . Nausea and vomiting during pregnancy prior to [redacted] weeks gestation 08/05/2018    Past Surgical History:  Procedure Laterality Date  . FOOT GANGLION EXCISION     Cyst removed from foot  . FOOT SURGERY      Prior to Admission medications   Medication Sig Start Date End Date Taking? Authorizing Provider  aspirin EC 81 MG tablet Take 81 mg by mouth daily.    [provider]  Prenatal Vit-Fe Fumarate-FA  (MULTIVITAMIN-PRENATAL) 27-0.8 MG TABS tablet Take 1 tablet by mouth daily at 12 noon.    [provider]  promethazine (PHENERGAN) 25 MG tablet Take 1 tablet (25 mg total) by mouth every 8 (eight) hours as needed for nausea or vomiting. 08/20/18   Caren Macadam, MD    Allergies Patient has no known allergies.  Family History  Problem Relation Age of Onset  . Asthma Father   . Seizures Sister   . Ovarian cancer Maternal Grandmother   . Cancer - Ovarian Maternal Grandmother   . Cancer Maternal Grandmother     Social History Social History   Tobacco Use  . Smoking status: Former Smoker    Packs/day: 0.50    Types: Cigarettes    Quit date: 07/16/2018    Years since quitting: 0.1  . Smokeless tobacco: Never Used  . Tobacco comment: 3 per day  Substance Use Topics  . Alcohol use: Not Currently    Frequency: Never    Comment: Last ETOH 03/12/18  . Drug use: Never    Review of Systems Constitutional: No fever/chills Eyes: No visual changes. ENT: No sore throat. No stiff neck no neck pain Cardiovascular: Denies chest pain. Respiratory: Denies shortness of breath. Gastrointestinal:   no vomiting.  No diarrhea.  No constipation. Genitourinary: Negative for dysuria. Musculoskeletal: Negative lower extremity swelling Skin: Negative for rash. Neurological: Negative for focal weakness or numbness.   ____________________________________________   PHYSICAL EXAM:  VITAL SIGNS: ED Triage Vitals  Enc Vitals Group     BP 09/13/18 0657 130/70     Pulse Rate 09/13/18 0657 (!) 116     Resp 09/13/18 0657 20     Temp 09/13/18 0657 98 F (36.7 C)     Temp Source 09/13/18 0657 Oral     SpO2 09/13/18 0657 97 %     Weight 09/13/18 0657 171 lb (77.6 kg)     Height 09/13/18 0657 5\' 3"  (1.6 m)     Head Circumference --      Peak Flow --      Pain Score 09/13/18 0658 10     Pain Loc --      Pain Edu? --      Excl. in [place]? --     Constitutional: Alert and oriented.  Well appearing and in no acute distress.  She is somewhat anxious Eyes: Conjunctivae are normal Head: Atraumatic HEENT: No congestion/rhinnorhea. Mucous membranes are slightly dry.  Oropharynx very slightly red, limited funduscopic exam is reassuring Neck:   Nontender with no meningismus, no masses, no stridor patient can touch her chin to her chest and also look up at the ceiling with no discomfort Cardiovascular: Normal rate, regular rhythm. Grossly normal heart sounds.  Good peripheral circulation. Respiratory: Normal respiratory effort.  No retractions. Lungs CTAB. Abdominal: Soft and nontender. No distention. No guarding no rebound Avid uterus below the level of umbilicus palpated Back:  There is no focal tenderness or step  off.  there is no midline tenderness there are no lesions noted. there is no CVA tenderness Musculoskeletal: No lower extremity tenderness, no upper extremity tenderness. No joint effusions, no DVT signs strong distal pulses no edema Neurologic: Cranial nerves II through XII are grossly intact 5 out of 5 strength bilateral upper and lower extremity. Finger to nose within normal limits heel to shin within normal limits, speech is normal with no word finding difficulty or dysarthria, reflexes symmetric, pupils are equally round and reactive to light, there is no pronator drift, sensation is normal, vision is intact to confrontation, gait is deferred, there is no nystagmus, normal neurologic exam Skin:  Skin is warm, dry and intact. No rash noted. Psychiatric: Mood and affect are normal. Speech and behavior are normal.  ____________________________________________   LABS (all labs ordered are listed, but only abnormal results are displayed)  Labs Reviewed  GROUP A STREP BY PCR  SARS CORONAVIRUS 2 (HOSPITAL ORDER, PERFORMED IN [facility])  CBC WITH DIFFERENTIAL/PLATELET  COMPREHENSIVE METABOLIC PANEL  URINALYSIS, COMPLETE (UACMP) WITH MICROSCOPIC     Pertinent labs  results that were available during my care of the patient were reviewed by me and considered in my medical decision making (see chart for details). ____________________________________________  EKG  I personally interpreted any EKGs ordered by me or triage  ____________________________________________  RADIOLOGY  Pertinent labs & imaging results that were available during my care of the patient were reviewed by me and considered in my medical decision making (see chart for details). If possible, patient and/or family made aware of any abnormal findings.  No results found. ____________________________________________    PROCEDURES  Procedure(s) performed: None  Procedures  Critical Care performed: None  ____________________________________________   INITIAL IMPRESSION / ASSESSMENT AND PLAN / ED COURSE  Pertinent labs & imaging results that were available during my care of the patient were reviewed by me and considered in my medical decision making (see chart for details).   Patient with hyperemesis gravidarum presents today complaining of feeling dehydrated with a slight sore throat which she attributes to vomiting, headache which was gradual in onset over the last week with no fever or meningismal signs, normal neurologic exam, we will hydrate her and give her Tylenol for pain as she was taken nothing for this headache, low suspicion for strep throat but we will swab her.  We will check basic blood work after her diarrhea to make sure there is no electrolyte abnormalities, and we will reassess fetal heart tones.  ----------------------------------------- 9:08 AM on 09/13/2018 -----------------------------------------  Patient remains in no acute distress resting comfortably states her headache is improving her sore throat is improving and she feels less dehydrated.  Blood work is reassuring white count is normal as remains afebrile, remains neurologically  intact, discussed with him the risk benefits and alternatives of CT scan.  Do not think that that is likely indicated and there is certainly risk to taking a CT scan when you are pregnant, patient prefer to avoid this if possible we will discuss further as there progresses.  Strep is pending.  ----------------------------------------- 10:22 AM on 09/13/2018 -----------------------------------------  Here is unremarkable patient feels 100% better after IV fluids, I did obtain fetal heart tones at 147 with ultrasound which also showed good fetal motion.  This is a bedside ultrasound not for fetal evaluation otherwise.  Patient I did have a long talk about the various things that can cause a headache in pregnancy including cavernous thrombosis etc., and  understands that without imaging including CT scan or MRI or LP there is a limited my ability to further evaluate her headache but her headache is nearly gone and she does not want any of that she understands the risk benefits and alternatives of refusal.  ----------------------------------------- 12:38 PM on 09/13/2018 -----------------------------------------    Pt remains at neurologic baseline. At this time, there is nothing to suggest or support the diagnosis of subarachnoid hemorrhage, aneurysmal event, meningitis, tumor or mass, cavernous thrombosis, encephalitis, ischemic stroke, pseudotumor cerebri, glaucoma, temporal arteritis, or any other acute intracrania/neurological process. Extensive return precautions including but not limited to any new or worrisome symptoms such as worsening of, or change in, headache, any neurological symptoms, fever etc.. Natural disease course discussed with patient. The need for follow-up and all of my customary return precautions have been discussed as well..  She does decline CT scan or LP with interpreter present.  Understands risk benefits alternative of procedures and not doing them.  Also, I discussed with Dr.  Jean RosenthalJackson who would like the patient to start Zofran, appreciate consult from OB.  He does not feel any other Is indicated at this time.    ____________________________________________   FINAL CLINICAL IMPRESSION(S) / ED DIAGNOSES  Final diagnoses:  None      This chart was dictated using voice recognition software.  Despite best efforts to proofread,  errors can occur which can change meaning.      Jeanmarie PlantMcShane, Reyan Helle A, MD 09/13/18 [id number]    Jeanmarie PlantMcShane, Zaheer Wageman A, MD 09/13/18 [id number]    Jeanmarie PlantMcShane, Rosaleigh Brazzel A, MD 09/13/18 1023    Jeanmarie PlantMcShane, Everline Mahaffy A, MD 09/13/18 1239

## 2018-09-13 NOTE — ED Notes (Signed)
MD at bedside assessing FHT

## 2018-09-13 NOTE — Discharge Instructions (Signed)
Preferira no someterse a una TC o una puncin lumbar; por supuesto, esta es su eleccin, pero limita nuestra capacidad para evaluar su dolor de cabeza. tome tylenol para el malestar. Si su dolor de [place], cambia de opinin o se siente peor de Orthoptist, regrese a la sala de Multimedia programmer. seguir de cerca con el departamento de salud.

## 2018-09-13 NOTE — ED Notes (Signed)

## 2018-09-13 NOTE — ED Triage Notes (Signed)
Pt reports that she has a sore throt and horrible HA.

## 2018-09-13 NOTE — ED Notes (Signed)
Unable to find fetal heart tones with doppler. Jane,RN unsuccessful also. MD McShane made aware. [place] machine brought to room for MD Mcshane to assess

## 2018-09-17 ENCOUNTER — Ambulatory Visit: Payer: Medicaid Other | Admitting: Advanced Practice Midwife

## 2018-09-17 ENCOUNTER — Encounter: Payer: Self-pay | Admitting: Advanced Practice Midwife

## 2018-09-17 ENCOUNTER — Ambulatory Visit: Payer: Medicaid Other

## 2018-09-17 ENCOUNTER — Other Ambulatory Visit: Payer: Self-pay

## 2018-09-17 DIAGNOSIS — Z3480 Encounter for supervision of other normal pregnancy, unspecified trimester: Secondary | ICD-10-CM

## 2018-09-17 DIAGNOSIS — O219 Vomiting of pregnancy, unspecified: Secondary | ICD-10-CM

## 2018-09-17 DIAGNOSIS — O99810 Abnormal glucose complicating pregnancy: Secondary | ICD-10-CM

## 2018-09-17 DIAGNOSIS — R7989 Other specified abnormal findings of blood chemistry: Secondary | ICD-10-CM

## 2018-09-17 DIAGNOSIS — O99212 Obesity complicating pregnancy, second trimester: Secondary | ICD-10-CM

## 2018-09-17 NOTE — Progress Notes (Signed)
09/13/2018 to [facility] ED due to severe HA. Covid-19 and Group A test results negative. Otherwise, negative responses to Covid-19 screening questions for client and female partner with her at visit today. Denies international travel for self or FOB since pregnant. [facility] appt was via phone 09/09/2018 and labs drawn. Per client, [facility] will call her with [place] appt. Discontinued Aspirin when severe HA started.Per client, taking Dramamine for nausea which helps her better than any other medicine.

## 2018-09-17 NOTE — Progress Notes (Signed)
   PRENATAL VISIT NOTE  Subjective:  Audrey Munoz is a 23 y.o. G3P0020 at [redacted]w[redacted]d being seen today for ongoing prenatal care.  She is currently monitored for the following issues for this high-risk pregnancy and has Supervision of other normal pregnancy, antepartum; Obesity (BMI 30.0-34.9); Obesity affecting pregnancy; Nausea and vomiting during pregnancy prior to [redacted] weeks gestation; Low TSH level; Vaginal bleeding in pregnancy, first trimester; Abnormal glucose tolerance test during pregnancy, antepartum; and Encounter for antenatal screening for chromosomal anomalies on their problem list.  Patient reports headache.  Contractions: Not present. Vag. Bleeding: None.  Movement: Present. Denies leaking of fluid/ROM.   The following portions of the patient's history were reviewed and updated as appropriate: allergies, current medications, past family history, past medical history, past social history, past surgical history and problem list. Problem list updated.  Objective:   Vitals:   09/17/18 0830  BP: 118/70  Temp: (!) 97.2 F (36.2 C)  Weight: 172 lb 12.8 oz (78.4 kg)    Fetal Status: Fetal Heart Rate (bpm): 160 Fundal Height: 14 cm Movement: Present     General:  Alert, oriented and cooperative. Patient is in no acute distress.  Skin: Skin is warm and dry. No rash noted.   Cardiovascular: Normal heart rate noted  Respiratory: Normal respiratory effort, no problems with respiration noted  Abdomen: Soft, gravid, appropriate for gestational age.  Pain/Pressure: Absent     Pelvic: Cervical exam deferred        Extremities: Normal range of motion.  Edema: None  Mental Status: Normal mood and affect. Normal behavior. Normal judgment and thought content.   Assessment and Plan:  Pregnancy: [id number] at [redacted]w[redacted]d  1. Abnormal glucose tolerance test during pregnancy, antepartum 3 hr GTT done 08/27/18=wnl Needs repeat 3 hr GTT at 28 wks  2. Low TSH level Needs repeat TSH 6-8 wks (at  September appt)  3. Supervision of other normal pregnancy, antepartum Stopped taking ASA 81 mg daily on 09/13/18--counseled to resume Had [facility] appt with bloodwork for FIRST screen 09/09/18--pt states [facility] to call her with u/s appt  4. Obesity affecting pregnancy in second trimester 1 lb 12.8 oz (0.816 kg)  5. Nausea and vomiting during pregnancy prior to [redacted] weeks gestation Went to ER 09/13/18 and given IVF for h/a and dehydration.  Last vomited yesterday.  N&V better with Dramamine.  Hasn't eaten yet today--counseled To eat small high protein snacks q 2 hrs.   Preterm labor symptoms and general obstetric precautions including but not limited to vaginal bleeding, contractions, leaking of fluid and fetal movement were reviewed in detail with the patient. Please refer to After Visit Summary for other counseling recommendations.  Return in about 4 weeks (around 10/15/2018) for routine PNC.  Future Appointments  Date Time Provider [place]  10/14/2018  1:00 PM [facility] [place] 1 [facility]  10/15/2018 10:00 AM [facility] PROVIDER [facility] None    Herbie Saxon, CNM

## 2018-09-17 NOTE — Progress Notes (Deleted)
.  achd 

## 2018-09-27 ENCOUNTER — Telehealth: Payer: Self-pay | Admitting: Obstetrics and Gynecology

## 2018-09-27 NOTE — Telephone Encounter (Signed)
I called [name] with the aid of a Spanish interpreter with the normal results of her screening for MaterniT21, CF and SMA.  The patient was informed of the results of her recent MaterniT21 testing which yielded NEGATIVE results.  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  The sensitivity for trisomy 74, trisomy 21 and trisomy 77 using this testing are reported as 99.1%, 99.9% and 91.7% respectively.  Thus, while the results of this testing are highly accurate, they are not considered diagnostic at this time.  Should more definitive information be desired, the patient may still consider amniocentesis.   As requested to know by the patient, sex chromosome analysis was included for this sample.  Results were given to the patient's mother in law by phone per the patient's request. This is predicted with >99% accuracy.  A maternal serum AFP only should be considered if screening for neural tube defects is desired.  To review, CF is a genetic condition that occurs most often in Caucasian persons.  It primarily affects the lungs, digestive, and reproductive systems.  For someone to be at risk for having CF, both of their parents must be carriers for CF.  The testing can detect many persons who are carriers for CF and therefore determine if the pregnancy is at an increased risk for this condition.   The blood test results were negative when examined for the 32 most common mutations (or changes) in the gene for CF.  This means that she does not carry any of the most common changes in this gene.  Testing for these 32 mutations detects approximately 73% of carriers who are Hispanic.  Therefore, the chance that she is a carrier based on this negative result has been reduced from 1 in 46 to approximately 1 in 168.  Because this testing cannot detect all changes that may cause CF, we cannot eliminate the chance that this individual is a carrier completely.  The  results of the SMA carrier screening are also available.  SMA is also a recessive genetic condition with variable age of onset and severity caused by mutations in the SMN1 gene.  This carrier testing assesses the number of copies of this gene.  Persons with one copy of the SMN1 gene are carriers, and those with no copies are affected with the condition.  Individuals with two or more copies have a reduced chance to be a carrier.  Not all mutations can be detected with this testing, though it can detect 92.6% of carriers in the Hispanic population.  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 *3+80T>G SNP, thus reducing her chance to be a carrier from 1 in 76 to 1 in 906.  Again, this testing cannot eliminate the chance to have a child with SMA, but dramatically reduces the chance.    We encouraged the patient to call with any questions or concerns as they arise.  We may be reached at [phone].   Wilburt Finlay, MS, CGC

## 2018-10-11 ENCOUNTER — Other Ambulatory Visit: Payer: Self-pay

## 2018-10-11 DIAGNOSIS — Z3480 Encounter for supervision of other normal pregnancy, unspecified trimester: Secondary | ICD-10-CM

## 2018-10-13 NOTE — Addendum Note (Signed)
Addended by: Cletis [employer] on: 10/13/2018 03:51 PM   Modules accepted: Orders

## 2018-10-14 ENCOUNTER — Ambulatory Visit
Admission: RE | Admit: 2018-10-14 | Discharge: 2018-10-14 | Disposition: A | Discharge status: Home / Self Care | Payer: BC Managed Care – PPO | Source: Ambulatory Visit | Attending: Maternal & Fetal Medicine | Admitting: Maternal & Fetal Medicine

## 2018-10-14 ENCOUNTER — Other Ambulatory Visit: Payer: Self-pay

## 2018-10-14 DIAGNOSIS — Z3480 Encounter for supervision of other normal pregnancy, unspecified trimester: Secondary | ICD-10-CM

## 2018-10-14 DIAGNOSIS — Z3689 Encounter for other specified antenatal screening: Secondary | ICD-10-CM | POA: Diagnosis not present

## 2018-10-14 DIAGNOSIS — N83202 Unspecified ovarian cyst, left side: Secondary | ICD-10-CM | POA: Diagnosis not present

## 2018-10-14 DIAGNOSIS — O3482 Maternal care for other abnormalities of pelvic organs, second trimester: Secondary | ICD-10-CM | POA: Insufficient documentation

## 2018-10-14 DIAGNOSIS — Z3A17 17 weeks gestation of pregnancy: Secondary | ICD-10-CM | POA: Diagnosis not present

## 2018-10-14 HISTORY — DX: Syncope and collapse: R55

## 2018-10-15 ENCOUNTER — Encounter: Payer: Self-pay | Admitting: Emergency Medicine

## 2018-10-15 ENCOUNTER — Emergency Department: Payer: BC Managed Care – PPO

## 2018-10-15 ENCOUNTER — Other Ambulatory Visit: Payer: Self-pay

## 2018-10-15 ENCOUNTER — Emergency Department
Admission: EM | Admit: 2018-10-15 | Discharge: 2018-10-16 | Disposition: A | Discharge status: Home / Self Care | Payer: BC Managed Care – PPO | Attending: Emergency Medicine | Admitting: Emergency Medicine

## 2018-10-15 ENCOUNTER — Ambulatory Visit: Payer: BC Managed Care – PPO | Admitting: Family Medicine

## 2018-10-15 DIAGNOSIS — Z3480 Encounter for supervision of other normal pregnancy, unspecified trimester: Secondary | ICD-10-CM | POA: Diagnosis not present

## 2018-10-15 DIAGNOSIS — G932 Benign intracranial hypertension: Secondary | ICD-10-CM | POA: Insufficient documentation

## 2018-10-15 DIAGNOSIS — H4710 Unspecified papilledema: Secondary | ICD-10-CM | POA: Insufficient documentation

## 2018-10-15 DIAGNOSIS — Z7982 Long term (current) use of aspirin: Secondary | ICD-10-CM | POA: Diagnosis not present

## 2018-10-15 DIAGNOSIS — Z3490 Encounter for supervision of normal pregnancy, unspecified, unspecified trimester: Secondary | ICD-10-CM

## 2018-10-15 DIAGNOSIS — O219 Vomiting of pregnancy, unspecified: Secondary | ICD-10-CM

## 2018-10-15 DIAGNOSIS — R7989 Other specified abnormal findings of blood chemistry: Secondary | ICD-10-CM

## 2018-10-15 DIAGNOSIS — O209 Hemorrhage in early pregnancy, unspecified: Secondary | ICD-10-CM

## 2018-10-15 DIAGNOSIS — O99810 Abnormal glucose complicating pregnancy: Secondary | ICD-10-CM | POA: Diagnosis not present

## 2018-10-15 DIAGNOSIS — O4440 Low lying placenta NOS or without hemorrhage, unspecified trimester: Secondary | ICD-10-CM | POA: Insufficient documentation

## 2018-10-15 DIAGNOSIS — H538 Other visual disturbances: Secondary | ICD-10-CM | POA: Diagnosis present

## 2018-10-15 DIAGNOSIS — R51 Headache: Secondary | ICD-10-CM | POA: Insufficient documentation

## 2018-10-15 DIAGNOSIS — Z87891 Personal history of nicotine dependence: Secondary | ICD-10-CM | POA: Diagnosis not present

## 2018-10-15 DIAGNOSIS — Z79899 Other long term (current) drug therapy: Secondary | ICD-10-CM | POA: Diagnosis not present

## 2018-10-15 DIAGNOSIS — O99212 Obesity complicating pregnancy, second trimester: Secondary | ICD-10-CM

## 2018-10-15 LAB — URINALYSIS, ROUTINE W REFLEX MICROSCOPIC
Bilirubin Urine: NEGATIVE
Glucose, UA: NEGATIVE mg/dL
Hgb urine dipstick: NEGATIVE
Ketones, ur: NEGATIVE mg/dL
Leukocytes,Ua: NEGATIVE
Nitrite: NEGATIVE
Protein, ur: NEGATIVE mg/dL
Specific Gravity, Urine: 1.016 (ref 1.005–1.030)
pH: 6 (ref 5.0–8.0)

## 2018-10-15 LAB — COMPREHENSIVE METABOLIC PANEL
ALT: 16 U/L (ref 0–44)
AST: 17 U/L (ref 15–41)
Albumin: 3.4 g/dL — ABNORMAL LOW (ref 3.5–5.0)
Alkaline Phosphatase: 82 U/L (ref 38–126)
Anion gap: 8 (ref 5–15)
BUN: 7 mg/dL (ref 6–20)
CO2: 23 mmol/L (ref 22–32)
Calcium: 9.2 mg/dL (ref 8.9–10.3)
Chloride: 104 mmol/L (ref 98–111)
Creatinine, Ser: 0.36 mg/dL — ABNORMAL LOW (ref 0.44–1.00)
GFR calc Af Amer: >60 mL/min (ref >60)
GFR calc non Af Amer: >60 mL/min (ref >60)
Glucose, Bld: 115 mg/dL — ABNORMAL HIGH (ref 70–99)
Potassium: 3.6 mmol/L (ref 3.5–5.1)
Sodium: 135 mmol/L (ref 135–145)
Total Bilirubin: 0.2 mg/dL — ABNORMAL LOW (ref 0.3–1.2)
Total Protein: 6.7 g/dL (ref 6.5–8.1)

## 2018-10-15 LAB — DIFFERENTIAL
Abs Immature Granulocytes: 0.1 10*3/uL — ABNORMAL HIGH (ref 0.00–0.07)
Basophils Absolute: 0 10*3/uL (ref 0.0–0.1)
Basophils Relative: 0 %
Eosinophils Absolute: 0.1 10*3/uL (ref 0.0–0.5)
Eosinophils Relative: 1 %
Immature Granulocytes: 1 %
Lymphocytes Relative: 23 %
Lymphs Abs: 2.5 10*3/uL (ref 0.7–4.0)
Monocytes Absolute: 0.8 10*3/uL (ref 0.1–1.0)
Monocytes Relative: 7 %
Neutro Abs: 7.4 10*3/uL (ref 1.7–7.7)
Neutrophils Relative %: 68 %

## 2018-10-15 LAB — CBC
HCT: 32.8 % — ABNORMAL LOW (ref 36.0–46.0)
Hemoglobin: 11 g/dL — ABNORMAL LOW (ref 12.0–15.0)
MCH: 30.1 pg (ref 26.0–34.0)
MCHC: 33.5 g/dL (ref 30.0–36.0)
MCV: 89.9 fL (ref 80.0–100.0)
Platelets: 287 10*3/uL (ref 150–400)
RBC: 3.65 MIL/uL — ABNORMAL LOW (ref 3.87–5.11)
RDW: 12.9 % (ref 11.5–15.5)
WBC: 10.9 10*3/uL — ABNORMAL HIGH (ref 4.0–10.5)
nRBC: 0 % (ref 0.0–0.2)

## 2018-10-15 LAB — PROTIME-INR
INR: 0.9 (ref 0.8–1.2)
Prothrombin Time: 12.4 seconds (ref 11.4–15.2)

## 2018-10-15 LAB — POCT PREGNANCY, URINE: Preg Test, Ur: POSITIVE — AB

## 2018-10-15 LAB — APTT: aPTT: 24 seconds (ref 24–36)

## 2018-10-15 NOTE — ED Provider Notes (Addendum)
[facility] Emergency Department Provider Note  ____________________________________________   First MD Initiated Contact with Patient 10/15/18 2127     (approximate)  I have reviewed the triage vital signs and the nursing notes.   HISTORY  Chief Complaint Blurry vision   HPI Audrey Munoz is a 23 y.o. female who is 18 weeks 2 days who presents for headache.  Patient was sent in by ophthalmology for having papilledema in both eyes.  Patient has been having some blurry vision that happens during the day and also having some headaches that are mild, intermittent, nothing makes it better, nothing makes it worse. Denies headache now.  No gush of fluids/vaginal bleeding.   Spanish speaker interpretor was used.      Past Medical History:  Diagnosis Date  . Seizures (HCC)    Seizures as a child with ?med  . Syncope     Patient Active Problem List   Diagnosis Date Noted  . Low-lying placenta 10/15/2018  . Encounter for antenatal screening for chromosomal anomalies   . Abnormal glucose tolerance test during pregnancy, antepartum 08/23/2018  . Vaginal bleeding in pregnancy, first trimester 08/18/2018  . Low TSH level 08/09/2018  . Supervision of other normal pregnancy, antepartum 08/05/2018  . Obesity (BMI 30.0-34.9) 08/05/2018  . Obesity affecting pregnancy 08/05/2018  . Nausea and vomiting during pregnancy prior to [redacted] weeks gestation 08/05/2018    Past Surgical History:  Procedure Laterality Date  . FOOT GANGLION EXCISION     Cyst removed from foot  . FOOT SURGERY      Prior to Admission medications   Medication Sig Start Date End Date Taking? Authorizing Provider  aspirin EC 81 MG tablet Take 81 mg by mouth daily.    [provider]  dimenhyDRINATE (DRAMAMINE) 50 MG tablet Take 50 mg by mouth every 8 (eight) hours as needed.    [provider]  ondansetron (ZOFRAN) 4 MG tablet Take 1 tablet (4 mg total) by mouth every 8  (eight) hours as needed for nausea or vomiting. Patient not taking: Reported on 09/17/2018 09/13/18   Jeanmarie PlantMcShane, James A, MD  Prenatal Vit-Fe Fumarate-FA (MULTIVITAMIN-PRENATAL) 27-0.8 MG TABS tablet Take 1 tablet by mouth daily at 12 noon.    [provider]  promethazine (PHENERGAN) 25 MG tablet Take 1 tablet (25 mg total) by mouth every 8 (eight) hours as needed for nausea or vomiting. 08/20/18   Federico FlakeNewton, Kimberly Niles, MD    Allergies Patient has no known allergies.  Family History  Problem Relation Age of Onset  . Asthma Father   . Seizures Sister   . Ovarian cancer Maternal Grandmother   . Cancer - Ovarian Maternal Grandmother   . Cancer Maternal Grandmother     Social History Social History   Tobacco Use  . Smoking status: Former Smoker    Packs/day: 0.50    Types: Cigarettes    Quit date: 07/16/2018    Years since quitting: 0.2  . Smokeless tobacco: Never Used  . Tobacco comment: 3 per day  Substance Use Topics  . Alcohol use: Not Currently    Frequency: Never    Comment: Last ETOH 03/12/18  . Drug use: Never      Review of Systems Constitutional: No fever/chills Eyes: + visual changes. ENT: No sore throat. Cardiovascular: Denies chest pain. Respiratory: Denies shortness of breath. Gastrointestinal: No abdominal pain.  No nausea, no vomiting.  No diarrhea.  No constipation. Genitourinary: Negative for dysuria. Musculoskeletal: Negative for  back pain. Skin: Negative for rash. Neurological: +headache, no focal weakness or numbness. All other ROS negative ____________________________________________   PHYSICAL EXAM:  VITAL SIGNS: ED Triage Vitals  Enc Vitals Group     BP 10/15/18 1819 139/82     Pulse Rate 10/15/18 1819 (!) 111     Resp 10/15/18 1819 20     Temp 10/15/18 1819 98.4 F (36.9 C)     Temp Source 10/15/18 1819 Oral     SpO2 10/15/18 1819 97 %     Weight 10/15/18 1825 180 lb (81.6 kg)     Height 10/15/18 1825 5\' 3"  (1.6 m)     Head  Circumference --      Peak Flow --      Pain Score 10/15/18 1825 9     Pain Loc --      Pain Edu? --      Excl. in [place]? --     Constitutional: Alert and oriented. Well appearing and in no acute distress. Eyes: Conjunctivae are normal. EOMI. Head: Atraumatic. Nose: No congestion/rhinnorhea. Mouth/Throat: Mucous membranes are moist.   Neck: No stridor. Trachea Midline. FROM Cardiovascular: tachy, regular rhythm. Grossly normal heart sounds.  Good peripheral circulation. Respiratory: Normal respiratory effort.  No retractions. Lungs CTAB. Gastrointestinal: Soft and nontender. No distention. No abdominal bruits.  Musculoskeletal: No lower extremity tenderness nor edema.  No joint effusions. Neurologic:  Normal speech and language. No gross focal neurologic deficits are appreciated.  Skin:  Skin is warm, dry and intact. No rash noted. Psychiatric: Mood and affect are normal. Speech and behavior are normal. GU: Deferred   ____________________________________________   LABS (all labs ordered are listed, but only abnormal results are displayed)  Labs Reviewed  CBC - Abnormal; Notable for the following components:      Result Value   WBC 10.9 (*)    RBC 3.65 (*)    Hemoglobin 11.0 (*)    HCT 32.8 (*)    All other components within normal limits  DIFFERENTIAL - Abnormal; Notable for the following components:   Abs Immature Granulocytes 0.10 (*)    All other components within normal limits  COMPREHENSIVE METABOLIC PANEL - Abnormal; Notable for the following components:   Glucose, Bld 115 (*)    Creatinine, Ser 0.36 (*)    Albumin 3.4 (*)    Total Bilirubin 0.2 (*)    All other components within normal limits  PROTIME-INR  APTT  I-STAT CREATININE, ED  CBG MONITORING, ED  POC URINE PREG, ED   ____________________________________________  RADIOLOGY Robert Bellow, personally viewed and evaluated these images (plain radiographs) as part of my medical decision making, as well as  reviewing the written report by the radiologist.  Official radiology report(s): No results found.  ____  EKG MY READ: sinus tachycardia rate of 100, no st elevation, twi in lead 3, normal intervals.  ________________________________________   PROCEDURES  Procedure(s) performed (including Critical Care):  Procedures   ____________________________________________   INITIAL IMPRESSION / ASSESSMENT AND PLAN / ED COURSE  Audrey Munoz was evaluated in Emergency Department on 10/15/2018 for the symptoms described in the history of present illness. She was evaluated in the context of the global COVID-19 pandemic, which necessitated consideration that the patient might be at risk for infection with the SARS-CoV-2 virus that causes COVID-19. Institutional protocols and algorithms that pertain to the evaluation of patients at risk for COVID-19 are in a state of rapid change based on information released by  regulatory bodies including the CDC and federal and state organizations. These policies and algorithms were followed during the patient's care in the ED.     Patient presents with concern for new bilateral papilledema in pregnant patient.  Will get MRI to evaluate for cavernous venous thrombosis or tumor.  If work-up is negative will be LP to evaluate for pseudotumor cerebri.  Patient also is pregnant so consider preeclampsia but pt is less then 20 wks but will get urine protein to further evaluate.  Hemoglobin stable.  UA negative.   11:35 PM patient handed off pending MRI.  If negative will need LP to evaluate opening pressure.    ____________________________________________   FINAL CLINICAL IMPRESSION(S) / ED DIAGNOSES   Final diagnoses:  Papilledema  Pregnancy, unspecified gestational age      MEDICATIONS GIVEN DURING THIS VISIT:  Medications - No data to display   ED Discharge Orders    None       Note:  This document was prepared using Dragon voice  recognition software and may include unintentional dictation errors.   Concha Se, MD 10/16/18 [id number]    Concha Se, MD 10/21/18 [phone]

## 2018-10-15 NOTE — Progress Notes (Signed)
  PRENATAL VISIT NOTE  Subjective:  Audrey Munoz is a 23 y.o. G3P0020 at 104w2d being seen today for ongoing prenatal care.  She is currently monitored for the following issues for this low-risk pregnancy and has Supervision of other normal pregnancy, antepartum; Obesity (BMI 30.0-34.9); Obesity affecting pregnancy; Nausea and vomiting during pregnancy prior to [redacted] weeks gestation; Low TSH level; Vaginal bleeding in pregnancy, first trimester; Abnormal glucose tolerance test during pregnancy, antepartum; Encounter for antenatal screening for chromosomal anomalies; and Low-lying placenta on their problem list.  Patient reports no complaints.  Contractions: Not present. Vag. Bleeding: None.  Movement: Present. Denies leaking of fluid/ROM.   The following portions of the patient's history were reviewed and updated as appropriate: allergies, current medications, past family history, past medical history, past social history, past surgical history and problem list. Problem list updated.  Objective:   Vitals:   10/15/18 1003  BP: 119/72  Temp: (!) 96.9 F (36.1 C)  Weight: 180 lb (81.6 kg)    Fetal Status: Fetal Heart Rate (bpm): 145 Fundal Height: 19 cm Movement: Present     General:  Alert, oriented and cooperative. Patient is in no acute distress.  Skin: Skin is warm and dry. No rash noted.   Cardiovascular: Normal heart rate noted  Respiratory: Normal respiratory effort, no problems with respiration noted  Abdomen: Soft, gravid, appropriate for gestational age.  Pain/Pressure: Absent     Pelvic: Cervical exam deferred        Extremities: Normal range of motion.  Edema: None  Mental Status: Normal mood and affect. Normal behavior. Normal judgment and thought content.   Assessment and Plan:  Pregnancy: [id number] at [redacted]w[redacted]d  1. Abnormal glucose tolerance test during pregnancy, antepartum Repeat 3hr at 28 wk  2. Vaginal bleeding in pregnancy, first trimester resolved  3. Low TSH  level Repeat thyroid panel to day  4. Supervision of other normal pregnancy, antepartum Up to date - AFP Only [facility]- Scanned result  5. Obesity affecting pregnancy in second trimester TWG= 9 lb (4.082 kg) - at goal for weight  6. Nausea and vomiting during pregnancy prior to [redacted] weeks gestation improved  7. Low-lying placenta Follow up US scheduled in Nov   Preterm labor symptoms and general obstetric precautions including but not limited to vaginal bleeding, contractions, leaking of fluid and fetal movement were reviewed in detail with the patient. Please refer to After Visit Summary for other counseling recommendations.  Return in about 4 weeks (around 11/12/2018) for Routine prenatal care, Telehealth/Virtual health OB Visit.  Future Appointments  Date Time Provider [place]  12/20/2018 10:00 AM [facility] [place] [facility]    Caren Macadam, MD

## 2018-10-15 NOTE — Progress Notes (Addendum)
Patient here for maternity revisit at 38 2/7. Patient consented to AFP only today. Also needs bloodwork for TSH with T3/T4, per notes. Had U/S yesterday.Jenetta Downer, RN

## 2018-10-15 NOTE — ED Triage Notes (Signed)
Pt arrives via POV to ER due to referral of eye doctor after abnormal results during exam.   Pt states onset of headache about 3 weeks ago. She went to the ER at that point to have her head checked out, which is where the ER suggested for her to get her vision checked.    Pt states first exam showed inflammation in her optic nerve so they sent her to the eye doctor that she saw today for that.

## 2018-10-16 ENCOUNTER — Emergency Department: Payer: BC Managed Care – PPO

## 2018-10-16 DIAGNOSIS — H4710 Unspecified papilledema: Secondary | ICD-10-CM | POA: Diagnosis not present

## 2018-10-16 LAB — CSF CELL COUNT WITH DIFFERENTIAL
Eosinophils, CSF: 0 %
Lymphs, CSF: 0 %
Monocyte-Macrophage-Spinal Fluid: 100 %
RBC Count, CSF: 0 /mm3 (ref 0–3)
RBC Count, CSF: 22 /mm3 — ABNORMAL HIGH (ref 0–3)
Segmented Neutrophils-CSF: 0 %
Tube #: 1
Tube #: 4
WBC, CSF: 0 /mm3 (ref 0–5)
WBC, CSF: 2 /mm3 (ref 0–5)

## 2018-10-16 LAB — THYROID PANEL WITH TSH
Free Thyroxine Index: 0.5 — ABNORMAL LOW (ref 1.2–4.9)
T3 Uptake Ratio: 7 % — ABNORMAL LOW (ref 24–39)
T4, Total: 7.7 ug/dL (ref 4.5–12.0)
TSH: 0.679 u[IU]/mL (ref 0.450–4.500)

## 2018-10-16 LAB — PROTEIN AND GLUCOSE, CSF
Glucose, CSF: 57 mg/dL (ref 40–70)
Total  Protein, CSF: 19 mg/dL (ref 15–45)

## 2018-10-16 LAB — GLUCOSE, CAPILLARY: Glucose-Capillary: 99 mg/dL (ref 70–99)

## 2018-10-16 MED ORDER — IOHEXOL 350 MG/ML SOLN
75.0000 mL | Freq: Once | INTRAVENOUS | Status: AC | PRN
  Administered 2018-10-16: 75 mL via INTRAVENOUS

## 2018-10-16 NOTE — ED Notes (Signed)
Pt going to Xray for procedure at this time

## 2018-10-16 NOTE — Discharge Instructions (Addendum)
Your MRI and CT scan today were unremarkable and do not explain your symptoms.  The lumbar puncture does show elevated pressure in the fluid that surrounds your brain and spine.  Your initial spinal fluid tests were normal, and additional tests are still pending.  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  Your ophthalmologist recommends no medications for this at this time as they may interfere with the pregnancy.  You should follow-up with your prenatal clinic and neurologist in the next week for further evaluation.  Results for orders placed or performed during the hospital encounter of 10/15/18  CSF culture   Specimen: Back; Cerebrospinal Fluid  Result Value Ref Range   Specimen Description BACK    Special Requests LOWER    Gram Stain      NO ORGANISMS SEEN WBC SEEN Performed at [facility], [address] [place], [place] [address]    Culture PENDING    Report Status PENDING   Protime-INR  Result Value Ref Range   Prothrombin Time 12.4 11.4 - 15.2 seconds   INR 0.9 0.8 - 1.2  APTT  Result Value Ref Range   aPTT 24 24 - 36 seconds  CBC  Result Value Ref Range   WBC 10.9 (H) 4.0 - 10.5 K/uL   RBC 3.65 (L) 3.87 - 5.11 MIL/uL   Hemoglobin 11.0 (L) 12.0 - 15.0 g/dL   HCT 38.8 (L) 87.5 - 79.7 %   MCV 89.9 80.0 - 100.0 fL   MCH 30.1 26.0 - 34.0 pg   MCHC 33.5 30.0 - 36.0 g/dL   RDW 28.2 06.0 - 15.6 %   Platelets 287 150 - 400 K/uL   nRBC 0.0 0.0 - 0.2 %  Differential  Result Value Ref Range   Neutrophils Relative % 68 %   Neutro Abs 7.4 1.7 - 7.7 K/uL   Lymphocytes Relative 23 %   Lymphs Abs 2.5 0.7 - 4.0 K/uL   Monocytes Relative 7 %   Monocytes Absolute 0.8 0.1 - 1.0 K/uL   Eosinophils Relative 1 %   Eosinophils Absolute 0.1 0.0 - 0.5 K/uL   Basophils Relative 0 %   Basophils Absolute 0.0 0.0 - 0.1 K/uL   Immature Granulocytes 1 %   Abs Immature Granulocytes 0.10 (H) 0.00 - 0.07 K/uL   Comprehensive metabolic panel  Result Value Ref Range   Sodium 135 135 - 145 mmol/L   Potassium 3.6 3.5 - 5.1 mmol/L   Chloride 104 98 - 111 mmol/L   CO2 23 22 - 32 mmol/L   Glucose, Bld 115 (H) 70 - 99 mg/dL   BUN 7 6 - 20 mg/dL   Creatinine, Ser 1.53 (L) 0.44 - 1.00 mg/dL   Calcium 9.2 8.9 - 79.4 mg/dL   Total Protein 6.7 6.5 - 8.1 g/dL   Albumin 3.4 (L) 3.5 - 5.0 g/dL   AST 17 15 - 41 U/L   ALT 16 0 - 44 U/L   Alkaline Phosphatase 82 38 - 126 U/L   Total Bilirubin 0.2 (L) 0.3 - 1.2 mg/dL   GFR calc non Af Amer >60 >60 mL/min   GFR calc Af Amer >60 >60 mL/min   Anion gap 8 5 - 15  Urinalysis, Routine w reflex microscopic  Result Value Ref Range   Color, Urine YELLOW (A) YELLOW   APPearance HAZY (A) CLEAR   Specific Gravity, Urine 1.016 1.005 - 1.030   pH 6.0 5.0 - 8.0  Glucose, UA NEGATIVE NEGATIVE mg/dL   Hgb urine dipstick NEGATIVE NEGATIVE   Bilirubin Urine NEGATIVE NEGATIVE   Ketones, ur NEGATIVE NEGATIVE mg/dL   Protein, ur NEGATIVE NEGATIVE mg/dL   Nitrite NEGATIVE NEGATIVE   Leukocytes,Ua NEGATIVE NEGATIVE  Glucose, capillary  Result Value Ref Range   Glucose-Capillary 99 70 - 99 mg/dL  CSF cell count with differential collection tube #: 1  Result Value Ref Range   Tube # 1    Color, CSF COLORLESS COLORLESS   Appearance, CSF CLEAR CLEAR   RBC Count, CSF 22 (H) 0 - 3 /cu mm   WBC, CSF 0 0 - 5 /cu mm  CSF cell count with differential collection tube #: 4  Result Value Ref Range   Tube # 4    Color, CSF COLORLESS COLORLESS   Appearance, CSF CLEAR CLEAR   RBC Count, CSF 0 0 - 3 /cu mm   WBC, CSF 2 0 - 5 /cu mm   Segmented Neutrophils-CSF 0 %   Lymphs, CSF 0 %   Monocyte-Macrophage-Spinal Fluid 100 %   Eosinophils, CSF 0 %  Protein and glucose, CSF  Result Value Ref Range   Glucose, CSF 57 40 - 70 mg/dL   Total  Protein, CSF 19 15 - 45 mg/dL  Pregnancy, urine POC  Result Value Ref Range   Preg Test, Ur POSITIVE (A) NEGATIVE   Mr Brain Wo  Contrast  Result Date: 10/16/2018 CLINICAL DATA:  Headache and papilledema EXAM: MRI HEAD AND ORBITS WITHOUT CONTRAST MRV HEAD WITHOUT CONTRAST TECHNIQUE: Multiplanar, multiecho pulse sequences of the brain and surrounding structures were obtained without intravenous contrast. Multiplanar, multiecho pulse sequences of the orbits and surrounding structures were obtained including fat saturation techniques, without intravenous contrast administration. Magnetic resonance venography of the head was also performed without intravenous contrast administration. COMPARISON:  None. FINDINGS: MRI HEAD FINDINGS Brain: There is no acute ischemia. There is normal parenchymal signal throughout the brain. No hemorrhage or extra-axial collection. No mass lesion. The volume of the brain is normal for age. The midline structures are normal. Vascular: Normal intracranial arterial flow voids. Normal flow voids within the major dural venous sinuses. No chronic microhemorrhage. Skull and upper cervical spine: Normal Other: None MRI ORBITS FINDINGS Orbits: --Globes: Normal. --Bony orbit: Normal. --Preseptal soft tissues: Normal. --Intra- and extraconal orbital fat: Normal. No inflammatory stranding. --Optic nerves: Normal. --Lacrimal glands and fossae: Normal. --Extraocular muscles: Normal. Visualized sinuses:  No fluid levels or advanced mucosal thickening. Soft tissues: Normal. MRV BRAIN FINDINGS Superior sagittal sinus: Normal. Straight sinus: Normal. Inferior sagittal sinus, vein of Galen and internal cerebral veins: Normal. Transverse sinuses: There is limited flow related enhancement of the left transverse sinus. Normal right Sigmoid sinuses: Limited flow-related enhancement of the left sigmoid sinus. Normal right. Visualized jugular veins: Normal. IMPRESSION: 1. Normal MRI of the brain and orbits. 2. Limited flow related enhancement of the left transverse and sigmoid sinuses. While this is a common normal variant, thrombosis of  the sinus may have the same appearance on MRV. CT venogram with contrast would provide more complete exclusion of dural venous sinus thrombosis. Electronically Signed   By: Deatra RobinsonKevin  Herman M.D.   On: 10/16/2018 00:57   [place] Mfm Ob Detail +14 Wk  Result Date: 10/14/2018 ----------------------------------------------------------------------  OBSTETRICS REPORT                       (Signed Final 10/14/2018 03:41 pm) ---------------------------------------------------------------------- PATIENT INFO:  ID #:  [id number]                          D.O.B.:  09/12/1995 (23 yrs)  Name[facility]       [facility]              Visit Date: 10/14/2018 02:08 pm ---------------------------------------------------------------------- PERFORMED BY:  Performed By:     Bolivar HawParker Sumner          Referred By:      Isa RankinKIMBERLY NILES                    Sonographer                              [place] ---------------------------------------------------------------------- SERVICE(S) PROVIDED:   US MFM OB DETAIL +14 WK                              76811.01  ---------------------------------------------------------------------- INDICATIONS:   [redacted] weeks gestation of pregnancy                Z3A.17  ---------------------------------------------------------------------- FETAL EVALUATION:  Num Of Fetuses:         1  Fetal Heart Rate(bpm):  153  Cardiac Activity:       Present  Presentation:           Vertex  Placenta:               Posterior  P. Cord Insertion:      Normal ---------------------------------------------------------------------- BIOMETRY:  BPD:      40.3  mm     G. Age:  18w 1d         90  %    CI:        70.12   %    70 - 86                                                          FL/HC:      18.0   %    14.6 - 17.6  HC:      153.5  mm     G. Age:  18w 2d         91  %    HC/AC:      1.18        1.07 - 1.29  AC:      130.1  mm     G. Age:  18w 4d         89  %    FL/BPD:     68.5   %  FL:       27.6  mm     G. Age:  18w 3d         87  %     FL/AC:      21.2   %    20 - 24  HUM:      25.7  mm     G. Age:  18w 0d         80  %  NFT:       3.4  mm  CM:        4.2  mm  Est. FW:     242  gm      0 lb 9 oz   > 97  % ---------------------------------------------------------------------- GESTATIONAL AGE:  LMP:           25w 2d        Date:  04/20/18                 EDD:   01/25/19  U/S Today:     18w 3d                                        EDD:   03/14/19  Best:          17w 1d     Det. By:  Loman Chroman         EDD:   03/23/19                                      (08/09/18) ---------------------------------------------------------------------- ANATOMY:  Cranium:               Within Normal Limits   Aortic Arch:            Normal appearance  Cavum:                 CSP visualized         Ductal Arch:            Normal appearance  Ventricles:            Normal appearance      Diaphragm:              Within Normal Limits  Choroid Plexus:        Within Normal Limits   Stomach:                Seen  Cerebellum:            Within Normal Limits   Abdomen:                Within Normal                                                                        Limits  Posterior Fossa:       Within Normal Limits   Abdominal Wall:         Normal appearance  Nuchal Fold:           Within Normal Limits   Cord Vessels:           3 vessels  Face:                  Orbits visualized      Kidneys:                Normal appearance  Lips:                  Normal appearance      Bladder:                Seen  Thoracic:              Within Normal Limits   Spine:                  Normal appearance  Heart:                 4-Chamber view         Upper Extremities:      Visualized                         appears normal  RVOT:                  Normal appearance      Lower Extremities:      Visualized  LVOT:                  Normal appearance  Other:  Maternal Left ovarina cyst. ---------------------------------------------------------------------- IMPRESSION:  Thank you for referring  your patient for a fetal anatomical  survey.  Ultrasound demonstrates a singleton gestation at with  subjectively normal amniotic fluid volume.  The fetal biometry correlates with established dating.  Dating  is by earliest available ultrasound performed at [facility] on  08/09/18; measurements were consistent with 8 weeks 5 days  and EDD of 03/22/18.  Detailed evaluation of the fetal anatomy was performed.  The  fetal anatomical survey appears within normal limits within  the resolution of ultrasound as described above.  The placenta is posterior and low lying, and appears less  than 2cm from the internal cervical os.  Findings were reviewed. The majority of midtrimester low  lying placentas will  resolve. Recommend follow up 28-30  weeks to reassess placenta.  Thank you for allowing [place] to participate in your patient's care.  Please do not hesitate to contact us if we can be of further  assistance. ----------------------------------------------------------------------                   Consuelo Pandy, MD Electronically Signed Final Report   10/14/2018 03:41 pm ----------------------------------------------------------------------  Mr Mrv Head Wo Cm  Result Date: 10/16/2018 CLINICAL DATA:  Headache and papilledema EXAM: MRI HEAD AND ORBITS WITHOUT CONTRAST MRV HEAD WITHOUT CONTRAST TECHNIQUE: Multiplanar, multiecho pulse sequences of the brain and surrounding structures were obtained without intravenous contrast. Multiplanar, multiecho pulse sequences of the orbits and surrounding structures were obtained including fat saturation techniques, without intravenous contrast administration. Magnetic resonance venography of the head was also performed without intravenous contrast administration. COMPARISON:  None. FINDINGS: MRI HEAD FINDINGS Brain: There is no acute ischemia. There is normal parenchymal signal throughout the brain. No hemorrhage or extra-axial collection. No mass lesion. The volume of the brain is normal for  age. The midline structures are normal. Vascular: Normal intracranial arterial flow voids. Normal flow voids within the major dural venous sinuses. No chronic microhemorrhage. Skull and upper cervical spine: Normal Other: None MRI ORBITS FINDINGS Orbits: --Globes: Normal. --Bony orbit: Normal. --Preseptal soft tissues: Normal. --Intra- and extraconal orbital fat: Normal. No inflammatory stranding. --Optic nerves: Normal. --Lacrimal glands and fossae: Normal. --Extraocular muscles: Normal. Visualized sinuses:  No fluid levels or advanced mucosal thickening. Soft tissues: Normal. MRV BRAIN FINDINGS Superior sagittal sinus: Normal. Straight sinus: Normal. Inferior sagittal sinus, vein of Galen and internal cerebral veins: Normal. Transverse sinuses: There is limited flow related enhancement of the left transverse sinus. Normal right Sigmoid sinuses: Limited flow-related enhancement of the left sigmoid sinus. Normal right. Visualized  jugular veins: Normal. IMPRESSION: 1. Normal MRI of the brain and orbits. 2. Limited flow related enhancement of the left transverse and sigmoid sinuses. While this is a common normal variant, thrombosis of the sinus may have the same appearance on MRV. CT venogram with contrast would provide more complete exclusion of dural venous sinus thrombosis. Electronically Signed   By: Deatra Robinson M.D.   On: 10/16/2018 00:57   Ct Venogram Head  Result Date: 10/16/2018 CLINICAL DATA:  Papilledema EXAM: CT VENOGRAM HEAD TECHNIQUE: Noncontrast CT images of the head were obtained. Standard CT venography protocol was used following the administration of intravenous contrast material. Coronal and sagittal reformats and maximum intensity projections were also constructed. CONTRAST:  75mL OMNIPAQUE IOHEXOL 350 MG/ML SOLN COMPARISON:  Brain MRV 10/15/2018 FINDINGS: Brain: There is no mass, hemorrhage or extra-axial collection. The size and configuration of the ventricles and extra-axial CSF spaces are  normal. The brain parenchyma is normal, without acute or chronic infarction. Vascular: No abnormal hyperdensity of the major intracranial arteries or dural venous sinuses. No intracranial atherosclerosis. Skull: The visualized skull base, calvarium and extracranial soft tissues are normal. Sinuses/Orbits: No fluid levels or advanced mucosal thickening of the visualized paranasal sinuses. No mastoid or middle ear effusion. The orbits are normal. Superior sagittal sinus: Normal. Straight sinus: Normal. Inferior sagittal sinus, vein of Galen and internal cerebral veins: Normal. Transverse sinuses: Normal. Diminutive left transverse sinus. Sigmoid sinuses: Normal. Diminutive left sigmoid sinus. Visualized jugular veins: Normal. IMPRESSION: No dural venous sinus thrombosis. Diminutive left transverse and sigmoid sinuses, a common congenital variant. Electronically Signed   By: Deatra Robinson M.D.   On: 10/16/2018 02:59   Mr Dessa Phi Contrast  Result Date: 10/16/2018 CLINICAL DATA:  Headache and papilledema EXAM: MRI HEAD AND ORBITS WITHOUT CONTRAST MRV HEAD WITHOUT CONTRAST TECHNIQUE: Multiplanar, multiecho pulse sequences of the brain and surrounding structures were obtained without intravenous contrast. Multiplanar, multiecho pulse sequences of the orbits and surrounding structures were obtained including fat saturation techniques, without intravenous contrast administration. Magnetic resonance venography of the head was also performed without intravenous contrast administration. COMPARISON:  None. FINDINGS: MRI HEAD FINDINGS Brain: There is no acute ischemia. There is normal parenchymal signal throughout the brain. No hemorrhage or extra-axial collection. No mass lesion. The volume of the brain is normal for age. The midline structures are normal. Vascular: Normal intracranial arterial flow voids. Normal flow voids within the major dural venous sinuses. No chronic microhemorrhage. Skull and upper cervical spine:  Normal Other: None MRI ORBITS FINDINGS Orbits: --Globes: Normal. --Bony orbit: Normal. --Preseptal soft tissues: Normal. --Intra- and extraconal orbital fat: Normal. No inflammatory stranding. --Optic nerves: Normal. --Lacrimal glands and fossae: Normal. --Extraocular muscles: Normal. Visualized sinuses:  No fluid levels or advanced mucosal thickening. Soft tissues: Normal. MRV BRAIN FINDINGS Superior sagittal sinus: Normal. Straight sinus: Normal. Inferior sagittal sinus, vein of Galen and internal cerebral veins: Normal. Transverse sinuses: There is limited flow related enhancement of the left transverse sinus. Normal right Sigmoid sinuses: Limited flow-related enhancement of the left sigmoid sinus. Normal right. Visualized jugular veins: Normal. IMPRESSION: 1. Normal MRI of the brain and orbits. 2. Limited flow related enhancement of the left transverse and sigmoid sinuses. While this is a common normal variant, thrombosis of the sinus may have the same appearance on MRV. CT venogram with contrast would provide more complete exclusion of dural venous sinus thrombosis. Electronically Signed   By: Deatra Robinson M.D.   On: 10/16/2018 00:57   Dg Lumbar Puncture  Fluoro Guide  Result Date: 10/16/2018 INDICATION: Blurry vision with intermittent headaches. Abnormal ocular examination. Patient is currently pregnant. Attempted though failed bedside lumbar puncture. Please perform fluoroscopic LP for diagnostic and therapeutic purposes. EXAM: FLUOROSCOPIC GUIDED LUMBAR PUNCTURE WITH OPENING AND CLOSING PRESSURES COMPARISON:  None. MEDICATIONS: None. CONTRAST:  None. FLUOROSCOPY TIME:  18 seconds (additional lead drape was placed across the patient's pelvis for additional radiation protection given her gravid state. COMPLICATIONS: None immediate TECHNIQUE: Informed written consent was obtained from the patient after a discussion of the risks, benefits and alternatives to treatment, including additional conversation  regarding radiation exposure to her pregnancy via a medical translator. Questions regarding the procedure were encouraged and answered. A timeout was performed prior to the initiation of the procedure. The patient was placed prone, slightly RPO on the fluoroscopy table. The right at L2 - L3 transverse foramina was marked fluoroscopically. The skin overlying the operative site was prepped and draped in the usual sterile fashion. Local anesthesia was provided with 1% lidocaine. Under intermittent fluoroscopic guidance, a 22 gauge spinal needle was advanced into the spinal canal. Appropriate positioning was confirmed with the efflux of clear CSF. Opening pressure was obtained. Approximately 4 ml of cerebrospinal fluid was collected into four separate containers. Closing pressure was obtained. Samples were sent to the Laboratory as requested per the clinical team. The needle was removed and hemostasis was achieved with manual compression. A dressing was placed. The patient tolerated the above procedure well without immediate postprocedural complication. FINDINGS: Fluoroscopic imaging demonstrates appropriate positioning of the spinal needle within the spinal canal. Successful fluoroscopic lumbar puncture yielding approximately 12 ml of clear cerebrospinal fluid. Opening pressure - 31 mmHg Closing pressure - 19 mmHg. IMPRESSION: Technically successful fluoroscopic guided lumbar puncture with opening pressure of 31 mmHg and closing pressure of 19 mmHg. Samples were sent to the Laboratory as requested per the clinical team. Electronically Signed   By: Simonne Come M.D.   On: 10/16/2018 11:30

## 2018-10-16 NOTE — ED Provider Notes (Signed)
Procedures  Clinical Course as of Oct 16 1411  Sat Oct 16, 2018  0740 Discussed with IR Dr. Vernard Gambles who will arrange for lumbar puncture today.  Labs ordered to evaluate for other causes of presumed idiopathic intracranial hypertension.   [PS]  1409 CSF opening pressure 31, closing pressure 19 per IR note.  Cell counts are negative.  Gram stain is negative.  Protein and glucose are normal.  Stable for discharge with follow-up with neurology and [facility] prenatal clinic.   [PS]    Clinical Course User Index [PS] Carrie Mew, MD      Carrie Mew, MD 10/16/18 [phone]

## 2018-10-16 NOTE — Procedures (Signed)
Pre procedural Dx: Headache Post procedural Dx: Same  Technically successful fluoro guided LP with opening pressure of 31 mmHg and closing pressure of 19 mmHg.  @ 12 cc of clear CSF sent to lab for analysis.  EBL: None Complications: None immediate  Ronny Bacon, MD Pager #: [phone]

## 2018-10-16 NOTE — ED Notes (Signed)
Pt given lunch tray.

## 2018-10-16 NOTE — ED Provider Notes (Signed)
----------------------------------------- 1:39 AM on 10/16/2018 -----------------------------------------  MRI orbits/brain/MRV head interpreted per Dr. Chase PicketHerman:  1. Normal MRI of the brain and orbits.  2. Limited flow related enhancement of the left transverse and  sigmoid sinuses. While this is a common normal variant, thrombosis  of the sinus may have the same appearance on MRV. CT venogram with  contrast would provide more complete exclusion of dural venous sinus  thrombosis.    Using Spanish interpreter, updated patient about MRI results. Spoke with Dr. Chase PicketHerman from radiology reqarding safety of CT venogram in second trimester pregnancy; okay to proceed.  Also queried him regarding safety of LP under fluoroscopy.  He thinks that should be okay but wonder if hospital has a policy about this.   ----------------------------------------- 3:56 AM on 10/16/2018 -----------------------------------------  CT venogram head interpreted per Dr. Chase PicketHerman:  No dural venous sinus thrombosis. Diminutive left transverse and  sigmoid sinuses, a common congenital variant.    Using [employer] Spanish interpreter, updated patient on CT venogram results.  Discussed risk/benefits of proceeding with LP to determine pressure.  Patient consents to LP.  Charge nurse discussed with nursing supervisor here as well as woman; no hospital policy regarding second trimester pregnancy/LP under fluoroscopy.  ----------------------------------------- 4:28 AM on 10/16/2018 -----------------------------------------  LUMBAR PUNCTURE  Date/Time: 10/16/2018 at 4:35 AM Performed by: Irean HongSUNG,Jasin Brazel J Consent obtained using [employer] Spanish interpretor Consent: Verbal consent obtained. Written consent obtained. Risks and benefits: risks, benefits and alternatives were discussed Consent given by: Patient Patient understanding: patient states understanding of the procedure being performed  Patient consent: the patient's  understanding of the procedure matches consent given  Procedure consent: procedure consent matches procedure scheduled  Relevant documents: relevant documents present and verified  Test results: test results available and properly labeled Site marked: the operative site was marked Imaging studies: imaging studies available  Required items: required blood products, implants, devices, and special equipment available  Patient identity confirmed: verbally with patient and arm band  Time out: Immediately prior to procedure a "time out" was called to verify the correct patient, procedure, equipment, support staff and site/side marked as required.  Indications: Evaluate opening pressure as part of IIH work-up Anesthesia: local infiltration Local anesthetic: lidocaine 1% without epinephrine Anesthetic total: 5 ml Patient sedated: No Analgesia: No Preparation: Patient was prepped and draped in the usual sterile fashion. Lumbar space: L3-L4 interspace Patient's position: left lateral decubitus Needle gauge: 22 Needle length: 3.5 in Number of attempts: 2 - unsuccessful Opening pressure:  cm H2O Fluid appearance:  Tubes of fluid:  Total volume:  ml Post-procedure: site cleaned and adhesive bandage applied Patient tolerance: Patient tolerated the procedure well with no immediate complications   LP attempted using aid of [employer] Spanish interpreter.  2 attempts with 22-gauge 3.5" spinal needle unsuccessful.  Discussed with patient; will hold her until morning for radiology assisted lumbar puncture.  Spoke with OB/GYN midwife Lurena JoinerRebecca who feels LP under fluoroscopy should be fine with abdominal shielding.  Will speak with her attending in the morning.  Regardless, I am unable to get the procedure overnight as the local radiologist will not be in until the morning.  Another consideration would be LP under ultrasound guidance by radiology.  Patient will be held until the morning for  LP.   ----------------------------------------- 6:58 AM on 10/16/2018 -----------------------------------------  Patient sleeping in no acute distress.  Care transferred to Dr. Scotty CourtStafford at change of shift.  Plan to arrange LP with radiology; disposition per results of LP and opening pressure.  Paulette Blanch, MD 10/16/18 [phone]

## 2018-10-16 NOTE — ED Notes (Signed)
All samples from LP walked to Lab by [employer]

## 2018-10-16 NOTE — ED Notes (Signed)
Pt back from Xray. Pt to be kept at 25 degree.

## 2018-10-19 LAB — CSF CULTURE W GRAM STAIN
Culture: NO GROWTH
Gram Stain: NONE SEEN

## 2018-10-20 ENCOUNTER — Telehealth: Payer: Self-pay | Admitting: Family Medicine

## 2018-10-20 NOTE — Telephone Encounter (Signed)
Attempted to return call-left voicemail message via interpreter M. [place] Amena Dockham, RN

## 2018-10-20 NOTE — Telephone Encounter (Signed)
Wants to come in on Friday is having problems

## 2018-10-21 NOTE — Telephone Encounter (Signed)
TC to patient with interpreter, V. Olmedo. Patient has questions about what they found out about her eyes at the ED. Patient has appointment with Dr. Kenton Kingfisher tomorrow, and was told that RN can't interpret results of testing at ED, but that she can talk with Dr. Kenton Kingfisher tomorrow. Patient states that will be fine. Patient counseled to think about her questions tonight and write them down on paper to help her remember what she wanted to ask tomorrow. Patient states she will do that. Patient asked about her next [facility] maternity appointment and was told it would telehealth on 11/12/18. Patient denies further questions at this time.Jenetta Downer, RN

## 2018-10-22 ENCOUNTER — Other Ambulatory Visit: Payer: Self-pay

## 2018-10-22 ENCOUNTER — Telehealth: Payer: Self-pay | Admitting: Obstetrics & Gynecology

## 2018-10-22 ENCOUNTER — Ambulatory Visit (INDEPENDENT_AMBULATORY_CARE_PROVIDER_SITE_OTHER): Payer: Medicaid Other | Admitting: Obstetrics & Gynecology

## 2018-10-22 ENCOUNTER — Encounter: Payer: Self-pay | Admitting: Obstetrics & Gynecology

## 2018-10-22 VITALS — BP 110/70 | Wt 183.0 lb

## 2018-10-22 DIAGNOSIS — O4440 Low lying placenta NOS or without hemorrhage, unspecified trimester: Secondary | ICD-10-CM

## 2018-10-22 DIAGNOSIS — O99212 Obesity complicating pregnancy, second trimester: Secondary | ICD-10-CM

## 2018-10-22 DIAGNOSIS — O99810 Abnormal glucose complicating pregnancy: Secondary | ICD-10-CM | POA: Diagnosis not present

## 2018-10-22 DIAGNOSIS — Z3480 Encounter for supervision of other normal pregnancy, unspecified trimester: Secondary | ICD-10-CM

## 2018-10-22 DIAGNOSIS — G932 Benign intracranial hypertension: Secondary | ICD-10-CM

## 2018-10-22 DIAGNOSIS — Z3A19 19 weeks gestation of pregnancy: Secondary | ICD-10-CM

## 2018-10-22 DIAGNOSIS — Z3A26 26 weeks gestation of pregnancy: Secondary | ICD-10-CM

## 2018-10-22 DIAGNOSIS — O4442 Low lying placenta NOS or without hemorrhage, second trimester: Secondary | ICD-10-CM

## 2018-10-22 LAB — POCT URINALYSIS DIPSTICK OB: Glucose, UA: NEGATIVE

## 2018-10-22 NOTE — Progress Notes (Signed)
10/22/2018   Chief Complaint: Missed period  Transfer of Care Patient: from [facility] per pt request  History of Present Illness: Ms. Casidee Jann is a 23 y.o. G3P0020 [redacted]w[redacted]d based on First Trimester [place] results, different from Patient's last menstrual period was 04/23/2018. She is with an Estimated Date of Delivery: 03/16/19, with the above CC.   PNC at [facility], complicated by obesity, low lying placenta by recent US at [facility], and abnormal glucose testing first trimester.  She has recently been seen by opthalmology due to papilledema, and was seen in ER for headaches and dx by neurology with Idiopathic Intracranial HTN.  She had MRI and LP there, normal results according to the chart and neurologist as reported by the patient.  She is due to follow up w neurology.  She reports lingering low back pain related to the LP.  Some mild headaches, blurry vision.  No pelvic pain, bleeding, nausea.  2 prior miscarriages/abortions.  ROS: A 12-point review of systems was performed and negative, except as stated in the above HPI.  OBGYN History: As per HPI. OB History  Gravida Para Term Preterm AB Living  3 0 0 0 2 0  SAB TAB Ectopic Multiple Live Births  1 1 0 0 0    # Outcome Date GA Lbr Len/2nd Weight Sex Delivery Anes PTL Lv  3 Current           2 TAB           1 SAB            PAP normal in July 2020. No STD hx.   Past Medical History: Past Medical History:  Diagnosis Date  . Seizures ([place])    Seizures as a child with ?med  . Syncope     Past Surgical History: Past Surgical History:  Procedure Laterality Date  . FOOT GANGLION EXCISION     Cyst removed from foot  . FOOT SURGERY      Family History:  Family History  Problem Relation Age of Onset  . Asthma Father   . Seizures Sister   . Ovarian cancer Maternal Grandmother   . Cancer - Ovarian Maternal Grandmother   . Cancer Maternal Grandmother    She denies any female cancers, bleeding or blood clotting disorders.  She denies any  history of mental retardation, birth defects or genetic disorders in her or the FOB's history  Social History:  Social History   Socioeconomic History  . Marital status: Single    Spouse name: Not on file  . Number of children: Not on file  . Years of education: 75  . Highest education level: Not on file  Occupational History  . Not on file  Social Needs  . Financial resource strain: Not on file  . Food insecurity    Worry: Not on file    Inability: Not on file  . Transportation needs    Medical: Not on file    Non-medical: Not on file  Tobacco Use  . Smoking status: Former Smoker    Packs/day: 0.50    Types: Cigarettes    Quit date: 07/16/2018    Years since quitting: 0.2  . Smokeless tobacco: Never Used  . Tobacco comment: 3 per day  Substance and Sexual Activity  . Alcohol use: Not Currently    Frequency: Never    Comment: Last ETOH 03/12/18  . Drug use: Never  . Sexual activity: Yes    Comment: 01/2018 last used  Lifestyle  .  Physical activity    Days per week: 0 days    Minutes per session: 0 min  . Stress: Not on file  Relationships  . Social Musician on phone: Not on file    Gets together: Not on file    Attends religious service: Not on file    Active member of club or organization: Not on file    Attends meetings of clubs or organizations: Not on file    Relationship status: Not on file  . Intimate partner violence    Fear of current or ex partner: Not on file    Emotionally abused: Not on file    Physically abused: Not on file    Forced sexual activity: Not on file  Other Topics Concern  . Not on file  Social History Narrative  . Not on file   Any pets in the household: no  Allergy: No Known Allergies  Current Outpatient Medications:  Current Outpatient Medications:  .  Prenatal Vit-Fe Fumarate-FA (MULTIVITAMIN-PRENATAL) 27-0.8 MG TABS tablet, Take 1 tablet by mouth daily at 12 noon., Disp: , Rfl:  .  aspirin EC 81 MG tablet, Take  81 mg by mouth daily., Disp: , Rfl:  .  dimenhyDRINATE (DRAMAMINE) 50 MG tablet, Take 50 mg by mouth every 8 (eight) hours as needed., Disp: , Rfl:  .  ondansetron (ZOFRAN) 4 MG tablet, Take 1 tablet (4 mg total) by mouth every 8 (eight) hours as needed for nausea or vomiting. (Patient not taking: Reported on 09/17/2018), Disp: 8 tablet, Rfl: 0 .  promethazine (PHENERGAN) 25 MG tablet, Take 1 tablet (25 mg total) by mouth every 8 (eight) hours as needed for nausea or vomiting. (Patient not taking: Reported on 10/22/2018), Disp: 15 tablet, Rfl: 1   Physical Exam:   BP 110/70   Wt 183 lb (83 kg)   LMP 04/23/2018   Breastfeeding Unknown   BMI 32.42 kg/m  Body mass index is 32.42 kg/m. Constitutional: Well nourished, well developed female in no acute distress.  Neck:  Supple, normal appearance, and no thyromegaly  Cardiovascular: S1, S2 normal, no murmur, rub or gallop, regular rate and rhythm Respiratory:  Clear to auscultation bilateral. Normal respiratory effort Abdomen: positive bowel sounds and no masses, hernias; diffusely non tender to palpation, non distended Breasts: breasts appear normal, no suspicious masses, no skin or nipple changes or axillary nodes. Neuro/Psych:  Normal mood and affect.  Skin:  Warm and dry.  Lymphatic:  No inguinal lymphadenopathy.   Assessment: Ms. Sloane Palmer is a 23 y.o. G3P0020 [redacted]w[redacted]d based on [place] deternined [facility],  with an Estimated Date of Delivery: 03/16/19,  For transfer of prenatal care.  Plan:  1) Avoid alcoholic beverages. 2) Patient encouraged not to smoke.  3) Discontinue the use of all non-medicinal drugs and chemicals.  4) Take prenatal vitamins daily.  5) Seatbelt use advised 6) Nutrition, food safety (fish, cheese advisories, and high nitrite foods) and exercise discussed. 7) Hospital and practice style delivering at [facility] discussed  8) Patient is asked about travel to areas at risk for the Zika virus, and counseled to avoid travel and  exposure to mosquitoes or sexual partners who may have themselves been exposed to the virus. Testing is discussed, and will be ordered as appropriate.  9) Childbirth classes at [facility] advised 10) Referral placed for DR POTTER of NEUROLOGY as requested by patient and recommended after ER visit for Idiopathic Intracranial HTN. 11) [place] 28 weeks to follow up  on placenta and growth 12) 3 hour GTT 28 weeks for retesting for GDM 13) Obesity risk factors discussed for pregnancy   Problem list reviewed and updated.  Annamarie MajorPaul Khalise Billard, MD, Merlinda FrederickFACOG [facility], [facility] 10/22/2018  10:10 AM

## 2018-10-22 NOTE — Telephone Encounter (Signed)
Patient aware of appointment changes  

## 2018-10-22 NOTE — Patient Instructions (Signed)
Tercer trimestre de embarazo Third Trimester of Pregnancy  El tercer trimestre comprende desde la semana28 hasta la semana40 (desde el mes7 hasta el mes9). En este trimestre, el beb en gestacin (feto) crece muy rpidamente. Hacia el final del noveno mes, el beb en gestacin mide alrededor de 20pulgadas (45cm) de largo. Pesa entre 6y 10libras (2,70y 4,50kg). Siga estas indicaciones en su casa: Medicamentos  Tome los medicamentos de venta libre y los recetados solamente como se lo haya indicado el mdico. Algunos medicamentos son seguros para tomar durante el embarazo y otros no lo son.  Tome vitaminas prenatales que contengan por lo menos 600microgramos (?g) de cido flico.  Si tiene dificultad para mover el intestino (estreimiento), tome un medicamento para ablandar las heces (laxante) si su mdico se lo autoriza. Comida y bebida   Ingiera alimentos saludables de manera regular.  No coma carne cruda ni quesos sin cocinar.  Si obtiene poca cantidad de calcio de los alimentos que ingiere, consulte a su mdico sobre la posibilidad de tomar un suplemento diario de calcio.  La ingesta diaria de cuatro o cinco comidas pequeas en lugar de tres comidas abundantes.  Evite el consumo de alimentos ricos en grasas y azcares, como los alimentos fritos y los dulces.  Para evitar el estreimiento: ? Consuma alimentos ricos en fibra, como frutas y verduras frescas, cereales integrales y frijoles. ? Beba suficiente lquido para mantener el pis (orina) claro o de color amarillo plido. Actividad  Haga ejercicios solamente como se lo haya indicado el mdico. Interrumpa la actividad fsica si comienza a tener calambres.  No levante objetos pesados, use zapatos de tacones bajos y sintese derecha.  No haga ejercicio si hace demasiado calor, hay demasiada humedad o se encuentra en un lugar de mucha altura (altitud alta).  Puede continuar teniendo relaciones sexuales, a menos que el  mdico le indique lo contrario. Alivio del dolor y del malestar  Use un sostn que le brinde buen soporte si sus mamas estn sensibles.  Haga pausas frecuentes y descanse con las piernas levantadas si tiene calambres en las piernas o dolor en la zona lumbar.  Dese baos de asiento con agua tibia para aliviar el dolor o las molestias causadas por las hemorroides. Use una crema para las hemorroides si el mdico la autoriza.  Si desarrolla venas hinchadas y abultadas (vrices) en las piernas: ? Use medias de compresin o medias de descanso como se lo haya indicado el mdico. ? Levante (eleve) los pies durante 15minutos, 3 o 4veces por da. ? Limite el consumo de sal en sus alimentos. Seguridad  Colquese el cinturn de seguridad cuando conduzca.  Haga una lista de los nmeros de telfono de emergencia, que incluya los nmeros de telfono de familiares, amigos, el hospital, as como los departamentos de polica y bomberos. Preparacin para la llegada del beb Para prepararse para la llegada de su beb:  Tome clases prenatales.  Practique ir manejando al hospital.  Visite el hospital y recorra el rea de maternidad.  Hable en su trabajo acerca de tomar licencia cuando llegue el beb.  Prepare el bolso que llevar al hospital.  Prepare la habitacin del beb.  Concurra a los controles mdicos.  Compre un asiento de seguridad orientado hacia atrs para llevar al beb en el automvil. Aprenda cmo instalarlo en el auto. Instrucciones generales  No se d baos de inmersin en agua caliente, baos turcos ni saunas.  No consuma ningn producto que contenga nicotina o tabaco, como cigarrillos y cigarrillos   electrnicos. Si necesita ayuda para dejar de fumar, consulte al mdico.  No beba alcohol.  No se haga duchas vaginales ni use tampones o toallas higinicas perfumadas.  No mantenga las piernas cruzadas durante mucho tiempo.  No haga viajes de larga distancia, excepto si es  obligatorio. Hgalos solamente si su mdico la autoriza.  Visite a su dentista si no lo ha hecho durante el embarazo. Use un cepillo de cerdas suaves para cepillarse los dientes. Psese el hilo dental con suavidad.  Evite el contacto con las bandejas sanitarias de los gatos y la tierra que estos animales usan. Estos elementos contienen bacterias que pueden causar defectos congnitos al beb y la posible prdida del beb (aborto espontneo) o la muerte fetal.  Concurra a todas las visitas prenatales como se lo haya indicado el mdico. Esto es importante. Comunquese con un mdico si:  No est segura de si est en trabajo de parto o si ha roto la bolsa de las aguas.  Tiene mareos.  Tiene clicos leves o siente presin en la parte baja del vientre.  Sufre un dolor persistente en el abdomen.  Sigue teniendo malestar estomacal, vomita o tiene heces lquidas.  Advierte un lquido con olor ftido que proviene de la vagina.  Siente dolor al orinar. Solicite ayuda de inmediato si:  Tiene fiebre.  Tiene una prdida de lquido por la vagina.  Tiene sangrado o pequeas prdidas vaginales.  Siente dolor intenso o clicos en el abdomen.  Aumenta o baja de peso rpidamente.  Tiene dificultades para recuperar el aliento y siente dolor en el pecho.  Sbitamente se le hinchan mucho el rostro, las manos, los tobillos, los pies o las piernas.  No ha sentido los movimientos del beb durante una hora.  Siente un dolor de cabeza intenso que no se alivia con medicamentos.  Tiene dificultad para ver.  Tiene prdida de lquido o le sale un chorro de lquido de la vagina antes de estar en la semana 37.  Tiene espasmos abdominales (contracciones) regulares antes de estar en la semana 37. Resumen  El tercer trimestre comprende desde la semana28 hasta la semana40 (desde el mes7 hasta el mes9). Esta es la poca en que el beb en gestacin crece muy rpidamente.  Siga los consejos del mdico  con respecto a los medicamentos, la alimentacin y la actividad.  Preprese para la llegada del beb tomando las clases prenatales, preparando todo lo que necesitar el beb, arreglando la habitacin del beb y concurriendo a los controles mdicos.  Solicite ayuda de inmediato si tiene sangrado por la vagina, siente dolor en el pecho o tiene dificultad para respirar, o si no ha sentido que su beb se mueve en el transcurso de ms de una hora. Esta informacin no tiene como fin reemplazar el consejo del mdico. Asegrese de hacerle al mdico cualquier pregunta que tenga. Document Released: 09/15/2012 Document Revised: 08/18/2016 Document Reviewed: 08/18/2016 Elsevier Patient Education  2020 Elsevier Inc.  

## 2018-10-22 NOTE — Telephone Encounter (Signed)
-----   Message from Gae Dry, MD sent at 10/22/2018 10:07 AM EDT ----- Regarding: change appt I was going on faulty information Appt ROB in two weeks, but cancel ultrasound and 3 hour GTT (to be scheduled closer to 28 weeks) She is only 19 weeks now. Thx

## 2018-10-25 LAB — VDRL, CSF: VDRL Quant, CSF: NONREACTIVE

## 2018-11-05 ENCOUNTER — Other Ambulatory Visit: Payer: Medicaid Other

## 2018-11-05 ENCOUNTER — Other Ambulatory Visit: Payer: Self-pay

## 2018-11-05 ENCOUNTER — Ambulatory Visit (INDEPENDENT_AMBULATORY_CARE_PROVIDER_SITE_OTHER): Payer: BC Managed Care – PPO | Admitting: Obstetrics & Gynecology

## 2018-11-05 ENCOUNTER — Encounter: Payer: Self-pay | Admitting: Obstetrics & Gynecology

## 2018-11-05 VITALS — BP 120/70 | Wt 184.0 lb

## 2018-11-05 DIAGNOSIS — O99212 Obesity complicating pregnancy, second trimester: Secondary | ICD-10-CM

## 2018-11-05 DIAGNOSIS — Z3A21 21 weeks gestation of pregnancy: Secondary | ICD-10-CM

## 2018-11-05 DIAGNOSIS — Z23 Encounter for immunization: Secondary | ICD-10-CM | POA: Diagnosis not present

## 2018-11-05 DIAGNOSIS — Z3480 Encounter for supervision of other normal pregnancy, unspecified trimester: Secondary | ICD-10-CM

## 2018-11-05 LAB — POCT URINALYSIS DIPSTICK OB: Glucose, UA: NEGATIVE

## 2018-11-05 NOTE — Progress Notes (Signed)
  Subjective  Fetal Movement? yes Contractions? no Leaking Fluid? no Vaginal Bleeding? no No h/a or dizziness Objective  BP 120/70   Wt 184 lb (83.5 kg)   LMP 04/23/2018   BMI 32.59 kg/m  General: NAD Pumonary: no increased work of breathing Abdomen: gravid, non-tender Extremities: no edema Psychiatric: mood appropriate, affect full FHT 140s Assessment  23 y.o. G3P0020 at [redacted]w[redacted]d by  03/16/2019, by Ultrasound presenting for routine prenatal visit  Plan   Problem List Items Addressed This Visit    [redacted] weeks gestation of pregnancy    -  Primary   Relevant Orders   POC Urinalysis Dipstick OB (Completed)   Need for immunization against influenza       Relevant Orders   Flu Vaccine QUAD 36+ mos IM (Completed)    Neurology appt for possible idiopathic intracranial hypertension next week [place] sch at [facility] for follow up, OK to keep (11/23) 3 hour GTT at 28 weeks, needs to be scheduled nv PNV  Barnett Applebaum, MD, [place], [facility] 11/05/2018  9:53 AM

## 2018-11-06 LAB — CULTURE, FUNGUS WITHOUT SMEAR

## 2018-11-12 ENCOUNTER — Ambulatory Visit: Payer: Medicaid Other

## 2018-12-03 ENCOUNTER — Encounter: Payer: Self-pay | Admitting: Obstetrics and Gynecology

## 2018-12-03 ENCOUNTER — Other Ambulatory Visit: Payer: Self-pay | Admitting: Obstetrics and Gynecology

## 2018-12-03 ENCOUNTER — Other Ambulatory Visit: Payer: Self-pay

## 2018-12-03 ENCOUNTER — Ambulatory Visit (INDEPENDENT_AMBULATORY_CARE_PROVIDER_SITE_OTHER): Payer: Medicaid Other | Admitting: Obstetrics and Gynecology

## 2018-12-03 VITALS — BP 120/78 | Wt 187.0 lb

## 2018-12-03 DIAGNOSIS — O4442 Low lying placenta NOS or without hemorrhage, second trimester: Secondary | ICD-10-CM

## 2018-12-03 DIAGNOSIS — O99212 Obesity complicating pregnancy, second trimester: Secondary | ICD-10-CM

## 2018-12-03 DIAGNOSIS — N3001 Acute cystitis with hematuria: Secondary | ICD-10-CM

## 2018-12-03 DIAGNOSIS — O26842 Uterine size-date discrepancy, second trimester: Secondary | ICD-10-CM

## 2018-12-03 DIAGNOSIS — Z3A25 25 weeks gestation of pregnancy: Secondary | ICD-10-CM

## 2018-12-03 DIAGNOSIS — R350 Frequency of micturition: Secondary | ICD-10-CM

## 2018-12-03 DIAGNOSIS — O4440 Low lying placenta NOS or without hemorrhage, unspecified trimester: Secondary | ICD-10-CM

## 2018-12-03 DIAGNOSIS — Z3480 Encounter for supervision of other normal pregnancy, unspecified trimester: Secondary | ICD-10-CM

## 2018-12-03 DIAGNOSIS — N760 Acute vaginitis: Secondary | ICD-10-CM

## 2018-12-03 DIAGNOSIS — E669 Obesity, unspecified: Secondary | ICD-10-CM

## 2018-12-03 DIAGNOSIS — O26849 Uterine size-date discrepancy, unspecified trimester: Secondary | ICD-10-CM

## 2018-12-03 LAB — POCT URINALYSIS DIPSTICK
Bilirubin, UA: NEGATIVE
Glucose, UA: NEGATIVE
Leukocytes, UA: NEGATIVE
Nitrite, UA: NEGATIVE
Protein, UA: POSITIVE — AB
Spec Grav, UA: >=1.03 — AB (ref 1.010–1.025)
Urobilinogen, UA: 0.2 E.U./dL
pH, UA: 5 (ref 5.0–8.0)

## 2018-12-03 MED ORDER — NITROFURANTOIN MONOHYD MACRO 100 MG PO CAPS: 100.0000 mg | ORAL_CAPSULE | Freq: Two times a day (BID) | ORAL | 0 refills | Status: AC

## 2018-12-03 NOTE — Progress Notes (Signed)
    Routine Prenatal Care Visit  Subjective  Audrey Munoz is a 22 y.o. G3P0020 at [redacted]w[redacted]d being seen today for ongoing prenatal care.  She is currently monitored for the following issues for this low-risk pregnancy and has Supervision of other normal pregnancy, antepartum; Obesity (BMI 30.0-34.9); Obesity affecting pregnancy in second trimester; Encounter for antenatal screening for chromosomal anomalies; Low-lying placenta; and Idiopathic intracranial hypertension on their problem list.  ----------------------------------------------------------------------------------- Patient reports no complaints.   Contractions: Not present. Vag. Bleeding: None.  Movement: Present. Denies leaking of fluid.  ----------------------------------------------------------------------------------- The following portions of the patient's history were reviewed and updated as appropriate: allergies, current medications, past family history, past medical history, past social history, past surgical history and problem list. Problem list updated.   Objective  Blood pressure 120/78, weight 187 lb (84.8 kg), last menstrual period 04/23/2018, unknown if currently breastfeeding. Pregravid weight Pregravid weight not on file Total Weight Gain Not found. Urinalysis:      Fetal Status: Fetal Heart Rate (bpm): 140 Fundal Height: 32 cm Movement: Present     General:  Alert, oriented and cooperative. Patient is in no acute distress.  Skin: Skin is warm and dry. No rash noted.   Cardiovascular: Normal heart rate noted  Respiratory: Normal respiratory effort, no problems with respiration noted  Abdomen: Soft, gravid, appropriate for gestational age. Pain/Pressure: Present     Pelvic:  Cervical exam deferred        Extremities: Normal range of motion.  Edema: None  Mental Status: Normal mood and affect. Normal behavior. Normal judgment and thought content.    Wet Prep: PH: 4.5 Clue Cells: Negative Fungal elements:  Negative Trichomonas: Negative   Assessment   23 y.o. G3P0020 at [redacted]w[redacted]d by  03/16/2019, by Ultrasound presenting for routine prenatal visit  Plan    pregnancy3 Problems (from 04/23/18 to present)    Problem Noted Resolved   Supervision of other normal pregnancy, antepartum 08/05/2018 by Lora Havens, PA-C No   Overview Addendum 11/05/2018  9:58 AM by Gae Dry, MD     Nursing Staff Provider  Office Location  [facility] Dating  [place] 8 wks  Language  Spanish Anatomy US  DP MFM [place] 20 wks  Flu Vaccine  10/9 Genetic Screen  AFP only Neg  TDaP vaccine    Hgb A1C or  GTT Early  Third trimester   Rhogam  n/a   LAB RESULTS   Feeding Plan  breast Blood Type O/Positive/-- (07/09 1639)   Contraception  Antibody Negative (07/09 1639)  Circumcision  Rubella 1.42 (07/09 1639)  Pediatrician   RPR Non Reactive (07/09 1639)   Support Person  HBsAg Negative (07/09 1639)   Prenatal Classes  HIV Non-reactive (07/09 0000)  BTL Consent No, n/a GBS  (For PCN allergy, check sensitivities)   VBAC Consent n/a Pap  07/2018 normal           Gestational age appropriate obstetric precautions including but not limited to vaginal bleeding, contractions, leaking of fluid and fetal movement were reviewed in detail with the patient.    Normal scant discharge Wet prep normal UA: positive for blood Will treat for UTI. Urine culture and nuswab sent Will treat based off of result.  [place] at [place] [facility] for low lying placenta, growth/ afi  Return in about 3 weeks (around 12/24/2018) for ROB in person, 3hr GTT and labs.  Homero Fellers MD [facility], [facility] 12/03/2018, 11:01 AM

## 2018-12-03 NOTE — Progress Notes (Signed)
ROB C/o since Monday having vaginal discharge that is yellowish/green  Denies vb, Good FM

## 2018-12-03 NOTE — Patient Instructions (Signed)
Tercer trimestre de embarazo Third Trimester of Pregnancy El tercer trimestre comprende desde la semana28 hasta la semana40 (desde el mes7 hasta el mes9). El tercer trimestre es un perodo en el que el beb en gestacin (feto) crece rpidamente. Hacia el final del noveno mes, el feto mide alrededor de 20pulgadas (45cm) de largo y pesa entre 6 y 10 libras (2,700 y 4,500kg). Cambios en el cuerpo durante el tercer trimestre Su organismo continuar atravesando por muchos cambios durante el embarazo. Estos cambios varan de una mujer a otra. Durante el tercer trimestre:  Seguir aumentando de peso. Es de esperar que aumente entre 25 y 35libras (11 y 16kg) hacia el final del embarazo.  Podrn aparecer las primeras estras en las caderas, el abdomen y las mamas.  Puede tener necesidad de orinar con ms frecuencia porque el feto baja hacia la pelvis y ejerce presin sobre la vejiga.  Puede desarrollar o continuar teniendo acidez estomacal. Esto se debe a que el aumento de las hormonas hace que los msculos en el tubo digestivo trabajen ms lentamente.  Puede desarrollar o continuar teniendo estreimiento debido a que el aumento de las hormonas ralentiza la digestin y hace que los msculos que empujan los desechos a travs de los intestinos se relajen.  Puede desarrollar hemorroides. Estas son venas hinchadas (venas varicosas) en el recto que pueden causar picazn o dolor.  Puede desarrollar venas hinchadas y abultadas (venas varicosas) en las piernas.  Puede presentar ms dolor en la pelvis, la espalda o los muslos. Esto se debe al aumento de peso y al aumento de las hormonas que relajan las articulaciones.  Tal vez haya cambios en el cabello. Esto cambios pueden incluir su engrosamiento, crecimiento rpido y cambios en la textura. Adems, a algunas mujeres se les cae el cabello durante o despus del embarazo, o tienen el cabello seco o fino. Lo ms probable es que el cabello se le normalice  despus del nacimiento del beb.  Sus pechos seguirn creciendo y se pondrn cada vez ms sensibles. Un lquido amarillo (calostro) puede salir de sus pechos. Esta es la primera leche que usted produce para su beb.  El ombligo puede salir hacia afuera.  Puede observar que se le hinchan las manos, el rostro o los tobillos.  Puede presentar un aumento del hormigueo o entumecimiento en las manos, brazos y piernas. La piel de su vientre tambin puede sentirse entumecida.  Puede sentir que le falta el aire debido a que se expande el tero.  Puede tener ms problemas para dormir. Esto puede deberse al tamao de su vientre, una mayor necesidad de orinar y un aumento en el metabolismo de su cuerpo.  Puede notar que el feto "baja" o lo siente ms bajo, en el abdomen (aligeramiento).  Puede tener un aumento de la secrecin vaginal.  Puede notar que las articulaciones se sienten flojas y puede sentir dolor alrededor del hueso plvico. Qu debe esperar en las visitas prenatales Le harn exmenes prenatales cada 2semanas hasta la semana36. A partir de ese momento le harn los exmenes semanales. Durante una visita prenatal de rutina:  La pesarn para asegurarse de que usted y el beb estn creciendo normalmente.  Le tomarn la presin arterial.  Le medirn el abdomen para controlar el desarrollo del beb.  Se escucharn los latidos cardacos fetales.  Se evaluarn los resultados de los estudios solicitados en visitas anteriores.  Le revisarn el cuello del tero cuando est prxima la fecha de parto para controlar si el cuello uterino   se ha afinado o adelgazado (borrado).  Le harn una prueba de estreptococos del grupo B. Esto sucede entre las semanas 35 y 37. El mdico puede preguntarle lo siguiente:  Cmo le gustara que fuera el parto.  Cmo se siente.  Si siente los movimientos del beb.  Si ha tenido sntomas anormales, como prdida de lquido, sangrado, dolores de cabeza  intensos o clicos abdominales.  Si est consumiendo algn producto que contenga tabaco, como cigarrillos, tabaco de mascar y cigarrillos electrnicos.  Si tiene alguna pregunta. Otros exmenes o estudios de deteccin que pueden realizarse durante el tercer trimestre incluyen lo siguiente:  Anlisis de sangre para controlar los niveles de hierro (anemia).  Controles fetales para determinar su salud, nivel de actividad y crecimiento. Si tiene alguna enfermedad o hay problemas durante el embarazo, le harn estudios.  Prueba sin estrs. Esta prueba verifica la salud de su beb y se utiliza para detectar signos de problemas, tales como si el beb no est recibiendo suficiente oxgeno. Durante esta prueba, se coloca un cinturn alrededor de su vientre. Al moverse el beb, se controla su frecuencia cardaca. Qu es el falso trabajo de parto? El falso trabajo de parto es una afeccin en la que se sienten pequeos e irregulares espasmos de los msculos del tero (contracciones) que generalmente desaparecen al hacer reposo, cambiar de posicin o al beber agua. Estas contracciones se llaman contracciones de Braxton Hicks. Las contracciones pueden durar horas, das o incluso semanas, antes de que el verdadero trabajo de parto se inicie. Si las contracciones ocurren a intervalos regulares, se vuelven ms frecuentes, aumentan en intensidad o se vuelven dolorosas, debera ver al mdico.  Cules son los signos del trabajo de parto?  Clicos abdominales.  Contracciones regulares que comienzan en intervalos de 10 minutos y se vuelven ms fuertes y ms frecuentes con el tiempo.  Contracciones que comienzan en la parte superior del tero y se extienden hacia abajo, a la zona inferior del abdomen y la espalda.  Aumento de la presin en la pelvis y dolor latente en la espalda.  Una secrecin de mucosidad acuosa o con sangre que sale de la vagina.  Prdida de lquido amnitico. Esto tambin se conoce como  "ruptura de la bolsa de las aguas". Esto puede ser un chorro o un goteo constante y lento de lquido. Informe a su mdico si tiene un color u olor extrao. Si tiene alguno de estos signos, llame a su mdico de inmediato, incluso si es antes de la fecha de parto. Siga estas indicaciones en su casa: Medicamentos  Siga las indicaciones del mdico en relacin con el uso de medicamentos. Durante el embarazo, hay medicamentos que pueden tomarse y otros que no.  Tome vitaminas prenatales que contengan por lo menos 600microgramos (?g) de cido flico.  Si est estreida, tome un laxante suave, si el mdico lo autoriza. Qu debe comer y beber   Lleve una dieta equilibrada que incluya gran cantidad de frutas y verduras frescas, cereales integrales, buenas fuentes de protenas como carnes magras, huevos o tofu, y lcteos descremados. El mdico la ayudar a determinar la cantidad de peso que puede aumentar.  No coma carne cruda ni quesos sin cocinar. Estos elementos contienen grmenes que pueden causar defectos congnitos en el beb.  Si no consume muchos alimentos con calcio, hable con su mdico sobre si debera tomar un suplemento diario de calcio.  La ingesta diaria de cuatro o cinco comidas pequeas en lugar de tres comidas abundantes.  Limite el   consumo de alimentos con alto contenido de grasas y azcares procesados, como alimentos fritos o dulces.  Para evitar el estreimiento: ? Bebe suficiente lquido para mantener la orina clara o de color amarillo plido. ? Consuma alimentos ricos en fibra, como frutas y verduras frescas, cereales integrales y frijoles. Actividad  Haga ejercicio solamente como se lo haya indicado el mdico. La mayora de las mujeres pueden continuar su rutina de ejercicios durante el embarazo. Intente realizar como mnimo 30minutos de actividad fsica por lo menos 5das a la semana. Deje de hacer ejercicio si experimenta contracciones uterinas.  Evite levantar pesos  excesivos.  No haga ejercicio en condiciones de calor o humedad extremas, o a grandes alturas.  Use zapatos cmodos de tacn bajo.  Adopte una buena postura.  Puede seguir teniendo relaciones sexuales, excepto que el mdico le diga lo contrario. Alivio del dolor y del malestar  Haga pausas frecuentes y descanse con las piernas elevadas si tiene calambres en las piernas o dolor en la zona lumbar.  Dese baos de asiento con agua tibia para aliviar el dolor o las molestias causadas por las hemorroides. Use una crema para las hemorroides si el mdico la autoriza.  Use un sostn que le brinde buen soporte para prevenir las molestias causadas por la sensibilidad en los pechos.  Si tiene venas varicosas: ? Use pantimedias que brinden soporte o medias de compresin como se lo haya indicado el mdico. ? Eleve los pies durante 15minutos, 3 o 4veces por da. Cuidados prenatales  Escriba sus preguntas. Llvelas cuando concurra a las visitas prenatales.  Concurra a todas las visitas prenatales tal como se lo haya indicado el mdico. Esto es importante. Seguridad  Use el cinturn de seguridad en todo momento mientras conduce.  Haga una lista de los nmeros de telfono de emergencia, que incluya los nmeros de telfono de familiares, amigos, el hospital y los departamentos de polica y bomberos. Instrucciones generales  Evite el contacto con las bandejas sanitarias de los gatos y la tierra que estos animales usan. Estos elementos contienen grmenes que pueden causar defectos congnitos en el beb. Si tiene un gato, pdale a alguien que limpie la caja de arena por usted.  No haga viajes largos excepto que sea absolutamente necesario y solo con la autorizacin de su mdico.  No se d baos de inmersin en agua caliente, baos turcos ni saunas.  No beber alcohol.  No consuma ningn producto que contenga nicotina o tabaco, como cigarrillos y cigarrillos electrnicos. Si necesita ayuda para  dejar de fumar, consulte al mdico.  No use hierbas medicinales ni medicamentos que no le hayan recetado. Estas sustancias qumicas afectan la formacin y el desarrollo del beb.  No se haga duchas vaginales ni use tampones o toallas higinicas perfumadas.  No mantenga las piernas cruzadas durante largos periodos de tiempo.  Para prepararse para la llegada de su beb: ? Tome clases prenatales para entender, practicar, y hacer preguntas sobre el trabajo de parto y el parto. ? Haga un ensayo de la partida al hospital. ? Visite el hospital y recorra el rea de maternidad. ? Pida un permiso de maternidad o paternidad a sus empleadores. ? Organice para que algn familiar o amigo cuide a sus mascotas mientras usted est en el hospital. ? Compre un asiento de seguridad orientado hacia atrs, y asegrese de saber cmo instalarlo en su automvil. ? Prepare el bolso que llevar al hospital. ? Prepare la habitacin del beb. Asegrese de quitar todas las almohadas y animales   de peluche de la cuna del beb para evitar la asfixia.  Visite a su dentista si no lo ha hecho durante el embarazo. Use un cepillo de dientes blando para higienizarse los dientes y psese el hilo dental con suavidad. Comunquese con un mdico si:  No est segura de que est en trabajo de parto o de que ha roto la bolsa de las aguas.  Se siente mareada.  Siente clicos leves, presin en la pelvis o dolor persistente en el abdomen.  Siente dolor en la parte inferior de la espalda.  Tiene nuseas, vmitos o diarrea persistentes.  Observa una secrecin vaginal inusual o con mal olor.  Siente dolor al orinar. Solicite ayuda de inmediato si:  Rompe la bolsa de las aguas antes de la semana 37.  Tiene contracciones regulares en intervalos de menos de 5 minutos antes de la semana 37.  Tiene fiebre.  Tiene una prdida de lquido por la vagina.  Tiene sangrado o pequeas prdidas vaginales.  Tiene dolor o clicos  abdominales intensos.  Baja de peso o sube de peso rpidamente.  Tiene dificultad para respirar y siente dolor de pecho.  Sbitamente se le hinchan mucho el rostro, las manos, los tobillos, los pies o las piernas.  Su beb se mueve menos de 10 veces en 2 horas.  Siente un dolor de cabeza intenso que no se alivia al tomar medicamentos.  Nota cambios en la visin. Resumen  El tercer trimestre comprende desde la semana28 hasta la semana40, es decir, desde el mes7 hasta el mes9. El tercer trimestre es un perodo en el que el beb en gestacin (feto) crece rpidamente.  Durante el tercer trimestre, su incomodidad puede aumentar a medida que usted y su beb continan aumentando de peso. Es posible que tenga dolor abdominal, en las piernas y en la espalda, problemas para dormir y una mayor necesidad de orinar.  Durante el tercer trimestre, sus pechos seguirn creciendo y se pondrn cada vez ms sensibles. Un lquido amarillo (calostro) puede salir de sus pechos. Esta es la primera leche que usted produce para su beb.  El falso trabajo de parto es una afeccin en la que se sienten pequeos e irregulares espasmos de los msculos del tero (contracciones) que a la larga desaparecen. Estas contracciones se llaman contracciones de Braxton Hicks. Las contracciones pueden durar horas, das o incluso semanas, antes de que el verdadero trabajo de parto se inicie.  Los signos del trabajo de parto pueden incluir: calambres abdominales; contracciones regulares que comienzan en intervalos de 10 minutos y se vuelven ms fuertes y ms frecuentes con el tiempo; una secrecin de mucosidad acuosa o con sangre que sale de la vagina; aumento de la presin en la pelvis y dolor latente en la espalda; y prdida de lquido amnitico. Esta informacin no tiene como fin reemplazar el consejo del mdico. Asegrese de hacerle al mdico cualquier pregunta que tenga. Document Released: 10/23/2004 Document Revised:  05/27/2016 Document Reviewed: 05/27/2016 Elsevier Patient Education  2020 [employer]  

## 2018-12-05 LAB — URINE CULTURE

## 2018-12-07 ENCOUNTER — Other Ambulatory Visit: Payer: Self-pay | Admitting: Maternal & Fetal Medicine

## 2018-12-07 DIAGNOSIS — O4440 Low lying placenta NOS or without hemorrhage, unspecified trimester: Secondary | ICD-10-CM

## 2018-12-07 LAB — NUSWAB VAGINITIS PLUS (VG+)
Candida albicans, NAA: NEGATIVE
Candida glabrata, NAA: NEGATIVE
Chlamydia trachomatis, NAA: NEGATIVE
Megasphaera 1: HIGH Score — AB
Neisseria gonorrhoeae, NAA: NEGATIVE
Trich vag by NAA: NEGATIVE

## 2018-12-16 ENCOUNTER — Other Ambulatory Visit: Payer: Self-pay

## 2018-12-20 ENCOUNTER — Other Ambulatory Visit: Payer: Self-pay

## 2018-12-20 ENCOUNTER — Ambulatory Visit
Admission: RE | Admit: 2018-12-20 | Discharge: 2018-12-20 | Disposition: A | Discharge status: Home / Self Care | Payer: BC Managed Care – PPO | Source: Ambulatory Visit | Attending: Obstetrics and Gynecology | Admitting: Obstetrics and Gynecology

## 2018-12-20 DIAGNOSIS — Z3480 Encounter for supervision of other normal pregnancy, unspecified trimester: Secondary | ICD-10-CM

## 2018-12-20 DIAGNOSIS — Z3A26 26 weeks gestation of pregnancy: Secondary | ICD-10-CM | POA: Insufficient documentation

## 2018-12-20 DIAGNOSIS — O4440 Low lying placenta NOS or without hemorrhage, unspecified trimester: Secondary | ICD-10-CM | POA: Diagnosis present

## 2018-12-20 DIAGNOSIS — O4442 Low lying placenta NOS or without hemorrhage, second trimester: Secondary | ICD-10-CM | POA: Diagnosis not present

## 2018-12-31 ENCOUNTER — Encounter: Payer: Medicaid Other | Admitting: Obstetrics and Gynecology

## 2018-12-31 ENCOUNTER — Other Ambulatory Visit: Payer: Medicaid Other

## 2018-12-31 ENCOUNTER — Ambulatory Visit: Payer: Medicaid Other

## 2019-01-14 ENCOUNTER — Encounter: Payer: Self-pay | Admitting: Family Medicine

## 2019-01-14 NOTE — Progress Notes (Signed)
Pap in July does not have result note. NIL. Next in pap in 3 years (routine follow up).   RN to send patient pap card and Health Maintenance updated.

## 2019-01-17 ENCOUNTER — Ambulatory Visit (INDEPENDENT_AMBULATORY_CARE_PROVIDER_SITE_OTHER): Payer: BC Managed Care – PPO

## 2019-01-17 ENCOUNTER — Other Ambulatory Visit: Payer: Medicaid Other

## 2019-01-17 ENCOUNTER — Other Ambulatory Visit: Payer: Self-pay | Admitting: Obstetrics and Gynecology

## 2019-01-17 ENCOUNTER — Ambulatory Visit (INDEPENDENT_AMBULATORY_CARE_PROVIDER_SITE_OTHER): Payer: Medicaid Other | Admitting: Obstetrics & Gynecology

## 2019-01-17 ENCOUNTER — Other Ambulatory Visit: Payer: Self-pay

## 2019-01-17 ENCOUNTER — Encounter: Payer: Self-pay | Admitting: Obstetrics & Gynecology

## 2019-01-17 VITALS — BP 120/80 | Wt 197.0 lb

## 2019-01-17 DIAGNOSIS — O26843 Uterine size-date discrepancy, third trimester: Secondary | ICD-10-CM

## 2019-01-17 DIAGNOSIS — Z3A31 31 weeks gestation of pregnancy: Secondary | ICD-10-CM

## 2019-01-17 DIAGNOSIS — Z23 Encounter for immunization: Secondary | ICD-10-CM | POA: Diagnosis not present

## 2019-01-17 DIAGNOSIS — O479 False labor, unspecified: Secondary | ICD-10-CM

## 2019-01-17 DIAGNOSIS — Z3480 Encounter for supervision of other normal pregnancy, unspecified trimester: Secondary | ICD-10-CM

## 2019-01-17 DIAGNOSIS — O99212 Obesity complicating pregnancy, second trimester: Secondary | ICD-10-CM

## 2019-01-17 DIAGNOSIS — O99213 Obesity complicating pregnancy, third trimester: Secondary | ICD-10-CM

## 2019-01-17 DIAGNOSIS — R7309 Other abnormal glucose: Secondary | ICD-10-CM

## 2019-01-17 LAB — POCT URINALYSIS DIPSTICK OB

## 2019-01-17 NOTE — Progress Notes (Signed)
  Subjective  Fetal Movement? yes Contractions? Yes to a mild degree; lower abdominal pressure Leaking Fluid? no Vaginal Bleeding? no  Review of ULTRASOUND.    I have personally reviewed images and report of recent ultrasound done at [facility].    Plan of management to be discussed with patient. LGA, Vtx, Placenta normal  Objective  BP 120/80   Wt 197 lb (89.4 kg)   LMP 04/23/2018   BMI 34.90 kg/m  General: NAD Pumonary: no increased work of breathing Abdomen: gravid, non-tender Extremities: no edema Psychiatric: mood appropriate, affect full SVE and fFN drawn, cervix closed and thick\  Assessment  23 y.o. G3P0020 at [redacted]w[redacted]d by  03/16/2019, by Ultrasound presenting for routine prenatal visit  Plan   Problem List Items Addressed This Visit      Other   Supervision of other normal pregnancy, antepartum   Obesity affecting pregnancy in second trimester    Other Visit Diagnoses    [redacted] weeks gestation of pregnancy    -  Primary   Relevant Orders   POC Urinalysis Dipstick OB (Completed)   Need for Tdap vaccination       Irregular contractions       Relevant Orders   Fetal fibronectin      pregnancy3 Problems (from 04/23/18 to present)    Problem Noted Resolved   Supervision of other normal pregnancy, antepartum 08/05/2018 by Lora Havens, PA-C No   Overview Addendum 11/05/2018  9:58 AM by Gae Dry, MD     Nursing Staff Provider  Office Location  ACHD Dating  [place] 8 wks  Language  Spanish Anatomy US  DP MFM [place] 20 wks  Flu Vaccine  10/9 Genetic Screen  AFP only Neg  TDaP vaccine    Hgb A1C or  GTT Early  Third trimester   Rhogam  n/a   LAB RESULTS   Feeding Plan  breast Blood Type O/Positive/-- (07/09 1639)   Contraception  Antibody Negative (07/09 1639)  Circumcision  Rubella 1.42 (07/09 1639)  Pediatrician   RPR Non Reactive (07/09 1639)   Support Person  HBsAg Negative (07/09 1639)   Prenatal Classes  HIV Non-reactive (07/09 0000)  BTL Consent No, n/a  GBS  (For PCN allergy, check sensitivities)   VBAC Consent n/a Pap  07/2018 normal          Monitor for s/sx PTL  3 Hour GTT today      Counseled on possible need for lifestyles consult and diabetic counseling, dietary changes, possible meds      LGA may be related to genetics, GDM, other.  Plan MFM [place] 1/7; also [place] growth 36 weeks  Barnett Applebaum, MD, [facility], [facility] 01/17/2019  10:05 AM

## 2019-01-17 NOTE — Progress Notes (Signed)
Pap card mailed. Next pap in 3 years per Newton Gustavia Carie, RN  

## 2019-01-18 LAB — CBC WITH DIFFERENTIAL
Basophils Absolute: 0 10*3/uL (ref 0.0–0.2)
Basos: 0 %
EOS (ABSOLUTE): 0 10*3/uL (ref 0.0–0.4)
Eos: 0 %
Hematocrit: 32.5 % — ABNORMAL LOW (ref 34.0–46.6)
Hemoglobin: 10.9 g/dL — ABNORMAL LOW (ref 11.1–15.9)
Immature Grans (Abs): 0 10*3/uL (ref 0.0–0.1)
Immature Granulocytes: 0 %
Lymphocytes Absolute: 1.5 10*3/uL (ref 0.7–3.1)
Lymphs: 17 %
MCH: 27.5 pg (ref 26.6–33.0)
MCHC: 33.5 g/dL (ref 31.5–35.7)
MCV: 82 fL (ref 79–97)
Monocytes Absolute: 0.5 10*3/uL (ref 0.1–0.9)
Monocytes: 6 %
Neutrophils Absolute: 6.7 10*3/uL (ref 1.4–7.0)
Neutrophils: 77 %
RBC: 3.96 x10E6/uL (ref 3.77–5.28)
RDW: 12.1 % (ref 11.7–15.4)
WBC: 8.8 10*3/uL (ref 3.4–10.8)

## 2019-01-18 LAB — HEPATITIS B SURFACE ANTIGEN: Hepatitis B Surface Ag: NEGATIVE

## 2019-01-18 LAB — FETAL FIBRONECTIN: Fetal Fibronectin: NEGATIVE

## 2019-01-18 LAB — GESTATIONAL GLUCOSE TOLERANCE
Glucose, Fasting: 127 mg/dL — ABNORMAL HIGH (ref 65–94)
Glucose, GTT - 1 Hour: 239 mg/dL — ABNORMAL HIGH (ref 65–179)
Glucose, GTT - 2 Hour: 228 mg/dL — ABNORMAL HIGH (ref 65–154)
Glucose, GTT - 3 Hour: 170 mg/dL — ABNORMAL HIGH (ref 65–139)

## 2019-01-18 LAB — HIV ANTIBODY (ROUTINE TESTING W REFLEX): HIV Screen 4th Generation wRfx: NONREACTIVE

## 2019-01-18 NOTE — Progress Notes (Signed)
Let her know test for PTL negative and this is reassuring; >90% accurate at predicting no PTL for the next 7 days.

## 2019-01-18 NOTE — Progress Notes (Signed)
Pt aware.

## 2019-01-20 ENCOUNTER — Other Ambulatory Visit: Payer: Self-pay | Admitting: Obstetrics and Gynecology

## 2019-01-20 DIAGNOSIS — O24913 Unspecified diabetes mellitus in pregnancy, third trimester: Secondary | ICD-10-CM | POA: Insufficient documentation

## 2019-01-20 MED ORDER — ACCU-CHEK FASTCLIX LANCETS MISC: 1.0000 | Freq: Four times a day (QID) | 11 refills | Status: DC

## 2019-01-20 MED ORDER — ACCU-CHEK GUIDE W/DEVICE KIT: 1.0000 | PACK | Freq: Four times a day (QID) | 0 refills | Status: DC

## 2019-01-20 MED ORDER — ACCU-CHEK GUIDE VI STRP: ORAL_STRIP | 12 refills | Status: DC

## 2019-01-24 ENCOUNTER — Encounter: Payer: Medicaid Other | Admitting: Obstetrics and Gynecology

## 2019-01-24 ENCOUNTER — Ambulatory Visit (INDEPENDENT_AMBULATORY_CARE_PROVIDER_SITE_OTHER): Payer: Medicaid Other | Admitting: Obstetrics and Gynecology

## 2019-01-24 ENCOUNTER — Other Ambulatory Visit: Payer: Self-pay

## 2019-01-24 VITALS — BP 122/78 | Wt 195.0 lb

## 2019-01-24 DIAGNOSIS — O4443 Low lying placenta NOS or without hemorrhage, third trimester: Secondary | ICD-10-CM

## 2019-01-24 DIAGNOSIS — Z3480 Encounter for supervision of other normal pregnancy, unspecified trimester: Secondary | ICD-10-CM

## 2019-01-24 DIAGNOSIS — O24913 Unspecified diabetes mellitus in pregnancy, third trimester: Secondary | ICD-10-CM

## 2019-01-24 DIAGNOSIS — Z36 Encounter for antenatal screening for chromosomal anomalies: Secondary | ICD-10-CM

## 2019-01-24 DIAGNOSIS — G932 Benign intracranial hypertension: Secondary | ICD-10-CM

## 2019-01-24 DIAGNOSIS — O4440 Low lying placenta NOS or without hemorrhage, unspecified trimester: Secondary | ICD-10-CM

## 2019-01-24 DIAGNOSIS — O99213 Obesity complicating pregnancy, third trimester: Secondary | ICD-10-CM

## 2019-01-24 DIAGNOSIS — Z3A32 32 weeks gestation of pregnancy: Secondary | ICD-10-CM

## 2019-01-24 DIAGNOSIS — O3660X0 Maternal care for excessive fetal growth, unspecified trimester, not applicable or unspecified: Secondary | ICD-10-CM

## 2019-01-24 LAB — POCT URINALYSIS DIPSTICK OB: Glucose, UA: NEGATIVE

## 2019-01-24 NOTE — Progress Notes (Signed)
Routine Prenatal Care Visit  Subjective  Audrey Munoz is a 23 y.o. G3P0020 at [redacted]w[redacted]d being seen today for ongoing prenatal care.  She is currently monitored for the following issues for this high-risk pregnancy and has Supervision of other normal pregnancy, antepartum; Obesity (BMI 30.0-34.9); Obesity affecting pregnancy in second trimester; Encounter for antenatal screening for chromosomal anomalies; Low-lying placenta; Idiopathic intracranial hypertension; and Diabetes mellitus affecting pregnancy in third trimester on their problem list.  ----------------------------------------------------------------------------------- Patient reports no complaints.   Contractions: Irregular. Vag. Bleeding: None.  Movement: Present. Denies leaking of fluid.  ----------------------------------------------------------------------------------- The following portions of the patient's history were reviewed and updated as appropriate: allergies, current medications, past family history, past medical history, past social history, past surgical history and problem list. Problem list updated.   Objective  Blood pressure 122/78, weight 195 lb (88.5 kg), last menstrual period 04/23/2018, unknown if currently breastfeeding. Pregravid weight Pregravid weight not on file Total Weight Gain Not found. Urinalysis:      Fetal Status: Fetal Heart Rate (bpm): 150   Movement: Present  Presentation: Vertex  General:  Alert, oriented and cooperative. Patient is in no acute distress.  Skin: Skin is warm and dry. No rash noted.   Cardiovascular: Normal heart rate noted  Respiratory: Normal respiratory effort, no problems with respiration noted  Abdomen: Soft, gravid, appropriate for gestational age. Pain/Pressure: Present     Pelvic:  Cervical exam deferred        Extremities: Normal range of motion.     ental Status: Normal mood and affect. Normal behavior. Normal judgment and thought content.   Date Fasting  Breakfast Lunch Dinner  12/28 98     12/27 96  117 139  12/26  114  126    Assessment   23 y.o. G3P0020 at [redacted]w[redacted]d by  03/16/2019, by Ultrasound presenting for routine prenatal visit  Plan   pregnancy3 Problems (from 04/23/18 to present)    Problem Noted Resolved   Diabetes mellitus affecting pregnancy in third trimester 01/20/2019 by Natale Milch, MD No   Supervision of other normal pregnancy, antepartum 08/05/2018 by Landry Dyke, PA-C No   Overview Addendum 11/05/2018  9:58 AM by Nadara Mustard, MD     Nursing Staff Provider  Office Location  ACHD Dating  [place] 8 wks  Language  Spanish Anatomy US  DP MFM [place] 20 wks  Flu Vaccine  10/9 Genetic Screen  AFP only Neg  TDaP vaccine    Hgb A1C or  GTT Early  Third trimester   Rhogam  n/a   LAB RESULTS   Feeding Plan  breast Blood Type O/Positive/-- (07/09 1639)   Contraception  Antibody Negative (07/09 1639)  Circumcision  Rubella 1.42 (07/09 1639)  Pediatrician   RPR Non Reactive (07/09 1639)   Support Person  HBsAg Negative (07/09 1639)   Prenatal Classes  HIV Non-reactive (07/09 0000)  BTL Consent No, n/a GBS  (For PCN allergy, check sensitivities)   VBAC Consent n/a Pap  07/2018 normal            Gestational age appropriate obstetric precautions including but not limited to vaginal bleeding, contractions, leaking of fluid and fetal movement were reviewed in detail with the patient.    - given BG log, discussed insulin dosing if needed.  Based on 3-hr results would anticipate need for insulin eventually.  However, currently running at or near goal based on limited values - [place] to be done at [facility] on  02/02/2018 - We discussed recommendation for Cesarean section if concern for EFW exceeding 4500g  Return in about 1 week (around 01/31/2019) for [place] ([place] interpreter).  Malachy Mood, MD, [facility], [facility] 01/24/2019, 10:26 AM

## 2019-01-24 NOTE — Progress Notes (Signed)
ROB

## 2019-01-27 ENCOUNTER — Encounter: Payer: BC Managed Care – PPO | Attending: Obstetrics and Gynecology | Admitting: *Deleted

## 2019-01-27 ENCOUNTER — Other Ambulatory Visit: Payer: Self-pay

## 2019-01-27 ENCOUNTER — Encounter: Payer: Self-pay | Admitting: *Deleted

## 2019-01-27 VITALS — BP 136/84 | Ht 63.0 in | Wt 195.7 lb

## 2019-01-27 DIAGNOSIS — Z3A00 Weeks of gestation of pregnancy not specified: Secondary | ICD-10-CM | POA: Diagnosis not present

## 2019-01-27 DIAGNOSIS — O24410 Gestational diabetes mellitus in pregnancy, diet controlled: Secondary | ICD-10-CM

## 2019-01-27 NOTE — Patient Instructions (Signed)
Read booklet on Gestational Diabetes Follow Gestational Meal Planning Guidelines Include 1 serving of protein with snacks Complete a 3 Day Food Record and bring to next appointment Check blood sugars 4 x day - before breakfast and 2 hrs after every meal and record  Bring blood sugar log to all appointments Purchase urine ketone strips if ordered by MD and check urine ketones every am:  If + increase bedtime snack to 1 protein and 2 carbohydrate servings Walk 20-30 minutes at least 5 x week if permitted by MD

## 2019-01-27 NOTE — Progress Notes (Signed)
Diabetes Self-Management Education  Visit Type: First/Initial  Appt. Start Time: 0925 Appt. End Time: 1105  01/27/2019  Audrey Munoz, identified by name and date of birth, is a 23 y.o. female with a diagnosis of Diabetes: Gestational Diabetes.   ASSESSMENT  Blood pressure 136/84, height 5\' 3"  (1.6 m), weight 195 lb 11.2 oz (88.8 kg), last menstrual period 06/09/2018, estimated date of delivery 03/16/2019. Body mass index is 34.67 kg/m.  Diabetes Self-Management Education - 01/27/19 1316      Visit Information   Visit Type  First/Initial      Initial Visit   Diabetes Type  Gestational Diabetes    Are you currently following a meal plan?  Yes    What type of meal plan do you follow?  "no sugar"    Are you taking your medications as prescribed?  Yes    Date Diagnosed  2 weeks      Health Coping   How would you rate your overall health?  Fair      Psychosocial Assessment   Patient Belief/Attitude about Diabetes  Motivated to manage diabetes    Self-care barriers  English as a second language    Self-management support  Doctor's office;Family    Other persons present  Spouse/SO;Interpreter    Patient Concerns  Nutrition/Meal planning;Glycemic Control;Monitoring;Weight Control;Healthy Lifestyle    Special Needs  Other (comment)   materials in Spanish   Preferred Learning Style  Auditory;Visual;Hands on    Learning Readiness  Ready    How often do you need to have someone help you when you read instructions, pamphlets, or other written materials from your doctor or pharmacy?  1 - Never   takes a little longer - she read the assessment in 01/29/19 and Spanish and wrote her answers in English   What is the last grade level you completed in school?  high school      Pre-Education Assessment   Patient understands the diabetes disease and treatment process.  Needs Instruction    Patient understands incorporating nutritional management into lifestyle.  Needs Instruction    Patient undertands incorporating physical activity into lifestyle.  Needs Instruction    Patient understands using medications safely.  Needs Instruction    Patient understands monitoring blood glucose, interpreting and using results  Needs Review    Patient understands prevention, detection, and treatment of acute complications.  Needs Instruction    Patient understands prevention, detection, and treatment of chronic complications.  Needs Instruction    Patient understands how to develop strategies to address psychosocial issues.  Needs Instruction    Patient understands how to develop strategies to promote health/change behavior.  Needs Instruction      Complications   Last HgB A1C per patient/outside source  5.4 %   08/05/2018   How often do you check your blood sugar?  3-4 times/day    Fasting Blood glucose range (mg/dL)  10/06/2018   FBG's range from 96-111 mg/dL.   Postprandial Blood glucose range (mg/dL)  41-962   pp's range from 95-159 mg/dL   Have you had a dilated eye exam in the past 12 months?  Yes    Have you had a dental exam in the past 12 months?  Yes    Are you checking your feet?  No      Dietary Intake   Breakfast  tuna and crackers; bread and eggs    Snack (morning)  1-2 snacks/day with crackers and juice from fruits (strawberries)  Lunch  rice, beans, beef, eggs, soup    Dinner  beef, rice, beans - doesn't like non-starchy vegetables    Beverage(s)  water, milk      Exercise   Exercise Type  ADL's      Patient Education   Previous Diabetes Education  No    Disease state   Definition of diabetes, type 1 and 2, and the diagnosis of diabetes;Factors that contribute to the development of diabetes    Nutrition management   Role of diet in the treatment of diabetes and the relationship between the three main macronutrients and blood glucose level;Food label reading, portion sizes and measuring food.;Reviewed blood glucose goals for pre and post meals and how to  evaluate the patients' food intake on their blood glucose level.    Physical activity and exercise   Role of exercise on diabetes management, blood pressure control and cardiac health.    Medications  Other (comment)   Limited use of oral medications during pregnancy and possibility of insulin.   Monitoring  Purpose and frequency of SMBG.;Taught/discussed recording of test results and interpretation of SMBG.;Ketone testing, when, how.    Chronic complications  Relationship between chronic complications and blood glucose control    Psychosocial adjustment  Identified and addressed patients feelings and concerns about diabetes    Preconception care  Pregnancy and GDM  Role of pre-pregnancy blood glucose control on the development of the fetus;Reviewed with patient blood glucose goals with pregnancy;Role of family planning for patients with diabetes      Individualized Goals (developed by patient)   Reducing Risk  Improve blood sugars Prevent diabetes complications Lose weight Lead a healthier lifestyle Become more fit     Outcomes   Expected Outcomes  Demonstrated interest in learning. Expect positive outcomes       Individualized Plan for Diabetes Self-Management Training:   Learning Objective:  Patient will have a greater understanding of diabetes self-management. Patient education plan is to attend individual and/or group sessions per assessed needs and concerns.   Plan:   Patient Instructions  Read booklet on Gestational Diabetes Follow Gestational Meal Planning Guidelines Include 1 serving of protein with snacks Complete a 3 Day Food Record and bring to next appointment Check blood sugars 4 x day - before breakfast and 2 hrs after every meal and record  Bring blood sugar log to all appointments Purchase urine ketone strips if ordered by MD and check urine ketones every am:  If + increase bedtime snack to 1 protein and 2 carbohydrate servings Walk 20-30 minutes at least 5 x week  if permitted by MD  Expected Outcomes:  Demonstrated interest in learning. Expect positive outcomes  Education material provided:  Gestational Booklet (Spanish) Gestational Meal Planning Guidelines (Spanish) Simple Meal Plan (Spanish) Viewed Gestational Diabetes Video (Spanish) 3 Day Food Record (Spanish) Goals for a Healthy Pregnancy (Spanish)  If problems or questions, patient to contact team via:  Johny Drilling, [place], [place], CDE [phone]  Future DSME appointment: February 04, 2019 with the dietitian

## 2019-01-31 ENCOUNTER — Other Ambulatory Visit: Payer: Self-pay

## 2019-01-31 ENCOUNTER — Ambulatory Visit (INDEPENDENT_AMBULATORY_CARE_PROVIDER_SITE_OTHER): Payer: Medicaid Other | Admitting: Obstetrics and Gynecology

## 2019-01-31 ENCOUNTER — Encounter: Payer: Self-pay | Admitting: Obstetrics and Gynecology

## 2019-01-31 VITALS — BP 120/82 | Wt 196.0 lb

## 2019-01-31 DIAGNOSIS — O24913 Unspecified diabetes mellitus in pregnancy, third trimester: Secondary | ICD-10-CM

## 2019-01-31 DIAGNOSIS — Z3480 Encounter for supervision of other normal pregnancy, unspecified trimester: Secondary | ICD-10-CM

## 2019-01-31 DIAGNOSIS — G932 Benign intracranial hypertension: Secondary | ICD-10-CM

## 2019-01-31 DIAGNOSIS — O0993 Supervision of high risk pregnancy, unspecified, third trimester: Secondary | ICD-10-CM

## 2019-01-31 DIAGNOSIS — O0990 Supervision of high risk pregnancy, unspecified, unspecified trimester: Secondary | ICD-10-CM

## 2019-01-31 DIAGNOSIS — Z3A33 33 weeks gestation of pregnancy: Secondary | ICD-10-CM

## 2019-01-31 DIAGNOSIS — O99213 Obesity complicating pregnancy, third trimester: Secondary | ICD-10-CM

## 2019-01-31 DIAGNOSIS — Z3A34 34 weeks gestation of pregnancy: Secondary | ICD-10-CM

## 2019-01-31 DIAGNOSIS — O3660X0 Maternal care for excessive fetal growth, unspecified trimester, not applicable or unspecified: Secondary | ICD-10-CM

## 2019-01-31 NOTE — Progress Notes (Signed)
ROB C/o pelvic pain/pressure Denies lof, no vb, Good FM

## 2019-01-31 NOTE — Progress Notes (Signed)
Routine Prenatal Care Visit  Subjective  Audrey Munoz is a 24 y.o. G3P0020 at [redacted]w[redacted]d being seen today for ongoing prenatal care.  She is currently monitored for the following issues for this high-risk pregnancy and has Supervision of other normal pregnancy, antepartum; Obesity (BMI 30.0-34.9); Obesity affecting pregnancy in third trimester, antepartum; Encounter for antenatal screening for chromosomal anomalies; Low-lying placenta; Idiopathic intracranial hypertension; Diabetes mellitus affecting pregnancy in third trimester; and Large for gestational age fetus affecting management of mother, antepartum on their problem list.  ----------------------------------------------------------------------------------- Patient reports she has had continued headaches and vision changes, she has been following with neurology and opthamology.  Her headaches and vision changes are improved since she has been off of work, but are not resolved. She reports neurology recommended a cesarean section to avoid valsalva during labor. Neurologist is with [facility]  Contractions: Irregular. Vag. Bleeding: None.  Movement: Present. Denies leaking of fluid.  ----------------------------------------------------------------------------------- The following portions of the patient's history were reviewed and updated as appropriate: allergies, current medications, past family history, past medical history, past social history, past surgical history and problem list. Problem list updated.   Objective  Blood pressure 120/82, weight 196 lb (88.9 kg), last menstrual period 04/23/2018, unknown if currently breastfeeding. Pregravid weight Pregravid weight not on file Total Weight Gain Not found. Urinalysis:      Fetal Status: Fetal Heart Rate (bpm): 145 Fundal Height: 39 cm Movement: Present     General:  Alert, oriented and cooperative. Patient is in no acute distress.  Skin: Skin is warm and dry. No rash noted.     Cardiovascular: Normal heart rate noted  Respiratory: Normal respiratory effort, no problems with respiration noted  Abdomen: Soft, gravid, appropriate for gestational age. Pain/Pressure: Present     Pelvic:  Cervical exam deferred        Extremities: Normal range of motion.  Edema: None  Mental Status: Normal mood and affect. Normal behavior. Normal judgment and thought content.     Assessment   24 y.o. G3P0020 at [redacted]w[redacted]d by  03/16/2019, by Ultrasound presenting for routine prenatal visit  Plan   pregnancy3 Problems (from 04/23/18 to present)    Problem Noted Resolved   Large for gestational age fetus affecting management of mother, antepartum 01/24/2019 by Malachy Mood, MD No   Diabetes mellitus affecting pregnancy in third trimester 01/20/2019 by Homero Fellers, MD No   Supervision of other normal pregnancy, antepartum 08/05/2018 by Lora Havens, PA-C No   Overview Addendum 11/05/2018  9:58 AM by Gae Dry, MD     Nursing Staff Provider  Office Location  [facility] Dating  [place] 8 wks  Language  Spanish Anatomy US  DP MFM [place] 20 wks  Flu Vaccine  10/9 Genetic Screen  AFP only Neg  TDaP vaccine   01/17/2019 Hgb A1C or  GTT 3hr GTT: All values Elevated  Rhogam  n/a   LAB RESULTS   Feeding Plan  breast Blood Type O/Positive/-- (07/09 1639)   Contraception  Antibody Negative (07/09 1639)  Circumcision  Rubella 1.42 (07/09 1639)  Pediatrician   RPR Non Reactive (07/09 1639)   Support Person  HBsAg Negative (07/09 1639)   Prenatal Classes  HIV Non-reactive (07/09 0000)  BTL Consent No, n/a GBS  (For PCN allergy, check sensitivities)   VBAC Consent n/a Pap  07/2018 normal            Gestational age appropriate obstetric precautions including but not limited to vaginal bleeding,  contractions, leaking of fluid and fetal movement were reviewed in detail with the patient.    Discussed with MFM (Dr. Leatha Gilding) : recommended Diamox and  repeated LP as needed for  symptom management. She reports that Darcella Cheshire MD at [facility] could see her and experienced with caring for women with [facility] in pregnancy. MFM reports that she would also recommend 37 week IOL if macrosomic fetus [facility] could manage her pregnancy if desired and anesthesia is not comfortable managing her at [facility]  Would recommend a shorter second stage or labor with vacuum or forceps delivery.   Will place MFM and anesthesia referral. Continued coordination of care.   Glucose log reviewed. 10 out of 25 values elevated. Continue with diet control. May need initiation of medication soon. Next growth [place] in 2 weeks.   Return in about 1 week (around 02/07/2019) for ROB in person with MD.  Natale Milch MD [facility], [facility] 01/31/2019, 12:50 PM

## 2019-02-03 ENCOUNTER — Ambulatory Visit
Admission: RE | Admit: 2019-02-03 | Discharge: 2019-02-03 | Disposition: A | Discharge status: Home / Self Care | Payer: BC Managed Care – PPO | Source: Ambulatory Visit | Attending: Maternal & Fetal Medicine | Admitting: Maternal & Fetal Medicine

## 2019-02-03 ENCOUNTER — Other Ambulatory Visit: Payer: Self-pay

## 2019-02-03 ENCOUNTER — Ambulatory Visit (HOSPITAL_BASED_OUTPATIENT_CLINIC_OR_DEPARTMENT_OTHER)
Admission: RE | Admit: 2019-02-03 | Discharge: 2019-02-03 | Disposition: A | Discharge status: Home / Self Care | Payer: BC Managed Care – PPO | Source: Ambulatory Visit | Attending: Maternal & Fetal Medicine | Admitting: Maternal & Fetal Medicine

## 2019-02-03 DIAGNOSIS — Z87891 Personal history of nicotine dependence: Secondary | ICD-10-CM | POA: Diagnosis not present

## 2019-02-03 DIAGNOSIS — O4440 Low lying placenta NOS or without hemorrhage, unspecified trimester: Secondary | ICD-10-CM

## 2019-02-03 DIAGNOSIS — E669 Obesity, unspecified: Secondary | ICD-10-CM

## 2019-02-03 DIAGNOSIS — G932 Benign intracranial hypertension: Secondary | ICD-10-CM | POA: Diagnosis present

## 2019-02-03 DIAGNOSIS — O3660X0 Maternal care for excessive fetal growth, unspecified trimester, not applicable or unspecified: Secondary | ICD-10-CM

## 2019-02-03 DIAGNOSIS — O163 Unspecified maternal hypertension, third trimester: Secondary | ICD-10-CM | POA: Insufficient documentation

## 2019-02-03 DIAGNOSIS — Z3A34 34 weeks gestation of pregnancy: Secondary | ICD-10-CM | POA: Diagnosis not present

## 2019-02-03 DIAGNOSIS — O24419 Gestational diabetes mellitus in pregnancy, unspecified control: Secondary | ICD-10-CM | POA: Diagnosis not present

## 2019-02-03 DIAGNOSIS — O24913 Unspecified diabetes mellitus in pregnancy, third trimester: Secondary | ICD-10-CM | POA: Diagnosis not present

## 2019-02-03 DIAGNOSIS — O99213 Obesity complicating pregnancy, third trimester: Secondary | ICD-10-CM

## 2019-02-03 DIAGNOSIS — O0990 Supervision of high risk pregnancy, unspecified, unspecified trimester: Secondary | ICD-10-CM

## 2019-02-03 HISTORY — DX: Benign intracranial hypertension: G93.2

## 2019-02-03 LAB — GLUCOSE, CAPILLARY: Glucose-Capillary: 103 mg/dL — ABNORMAL HIGH (ref 70–99)

## 2019-02-03 NOTE — ED Notes (Signed)
Audrey Munoz Interpreter present for appt this am at [facility].

## 2019-02-03 NOTE — Progress Notes (Signed)
[facility] Consultation - [place]   Chief Complaint: Idiopathic intracranial HTN and gestational diabetes  HPI: [name] is a 24 y.o. G3P0020 at [redacted]w[redacted]d by [redacted]w[redacted]d US performed 08/09/2018 at [facility] - EDD 03/16/2019 - who presents in consultation from [facility]  for recommendations regarding BS management and regarding idiopathic intracranial HTN.  The patient's blood sugars have been diet controlled.  Her current blood sugars are: FBSs 88-111 with 8/15 > 95. 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 In the ED on 10/15/2018, she had an MRI that was normal with "limited flow related enhancement of the left transverse and sigmoid sinuses," but a subsequent CT venogram was normal.  On 10/16/2018, opening pressure was "31 mmHg".  Slightly elevated?  (Usually reported in cmH20 or mm H2O.)  She was seen by Dr. Malvin Johns, neurologist at [facility] and the PA.  She was advised increased rest, follow-up with neurology postpartum  and follow-up with her ophthalmologist monthly.    For the last 2 weeks, her vision has been increasingly blurry.  Obstetric History:  OB History  Gravida Para Term Preterm AB Living  3 0 0 0 2 0  SAB TAB Ectopic Multiple Live Births  1 1 0 0 0    # Outcome Date GA Lbr Len/2nd Weight Sex Delivery Anes PTL Lv  3 Current           2 TAB           1 SAB             Gynecologic History:  Benign  Past Medical History: Patient  has a past medical history of Gestational diabetes, Hypertension, Idiopathic intracranial hypertension, Seizures (HCC), and Syncope.   Past Surgical History: She  has a past surgical history that includes Foot surgery and Foot ganglion excision.    Medications: PNV  Allergies: Patient has No Known Allergies.   Social History: Patient  reports that she quit smoking about 6 months ago. Her smoking use included cigarettes. She smoked 0.50 packs per day. She has never used smokeless tobacco. She reports previous alcohol use. She reports that she does not use drugs.   Family History: family history includes Asthma in her father; Cancer in her maternal grandmother; Cancer - Ovarian in her maternal grandmother; Diabetes in her mother; Ovarian cancer in her maternal grandmother; Seizures in her sister.   Review of Systems A full 12 point review of systems was negative or as noted in the History of Present Illness.  Physical Exam: T 98.3, HR 103, O2sat 98%, BP 127/83  Wt 198.5, Ht 5'3"    RBS today 103  [place] today: There is a singleton fetus at [redacted]w[redacted]d gestation by [redacted]w[redacted]d US performed at [facility] on 08/09/2018.  EDD of 03/16/2019.  Fetal anatomy appears normal or was visualized previously and appeared normal. EFW is at the 58th percentile.  [facility] is at the 77th percentile.    Fetal growth is NORMAL.  Compared to our last scan, the dating of the patient's first [place] and EDD have been corrected.      Asessement: 1. Idiopathic intracranial hypertension   2. Diabetes mellitus affecting pregnancy in third trimester  4.  Obesity affecting pregnancy in third trimester, antepartum     Recommendations: Idiopathic intracranial HTN (pseudotumor cerebri)  I have requested an urgent appt with our OB neurologist, Dr. Darlin Priestly, at [facility].  GDM and obesity The goals of her insulin regimen is to have:  Fasting a capillary blood glucose between 60 and 95 mg/dl;  Two hour postprandial capillary blood glucoses less than 120 mg/dl.  She was counseled on the fetal and neonatal advantages of good glucose control   I have recommended she start a Lantus 20 units hs.  A prescription was sent to her pharmacy     In addition, we have the following  recommendations:   Ultrasound for fetal growth in 3 weeks and consult for delivery recommendations (scheduled here)  Twice weekly nonstress tests beginning now   Delivery planning will be determined once the patient is closer to her estimated due date. This will be determined by:    A. Fetal size;    B. Amniotic fluid volume;    C. Level of glucose control;    D. Other medical or obstetrical complications.  Total time spent with the patient was 40 minutes with greater than 50% spent in counseling and coordination of care.  We appreciate this consult and will be happy to be involved in the ongoing care of [name] in anyway her obstetricians desire.  Erasmo Score, MD [facility]

## 2019-02-04 ENCOUNTER — Encounter: Payer: BC Managed Care – PPO | Attending: Obstetrics and Gynecology | Admitting: Dietician

## 2019-02-04 ENCOUNTER — Encounter: Payer: Self-pay | Admitting: Dietician

## 2019-02-04 VITALS — Ht 63.0 in | Wt 198.2 lb

## 2019-02-04 DIAGNOSIS — Z3A00 Weeks of gestation of pregnancy not specified: Secondary | ICD-10-CM | POA: Diagnosis not present

## 2019-02-04 DIAGNOSIS — O24913 Unspecified diabetes mellitus in pregnancy, third trimester: Secondary | ICD-10-CM | POA: Diagnosis not present

## 2019-02-04 DIAGNOSIS — O24414 Gestational diabetes mellitus in pregnancy, insulin controlled: Secondary | ICD-10-CM

## 2019-02-04 DIAGNOSIS — O24410 Gestational diabetes mellitus in pregnancy, diet controlled: Secondary | ICD-10-CM

## 2019-02-04 DIAGNOSIS — Z713 Dietary counseling and surveillance: Secondary | ICD-10-CM | POA: Insufficient documentation

## 2019-02-04 NOTE — Progress Notes (Signed)
.   Patient's BG record indicates fasting BGs ranging 88-111 (8 total readings), and post-meal BGs ranging 97-128 + 3 readings >140 (22 total readings). She began taking insulin yesterday evening, cannot recall the name but took 20 units. Fasting BG this morning was 88. . Patient's diet recall (recorded on her phone) indicates generally balanced meals, controlled carb intake and inclusion of protein sources with her meals. She reports having "slow digestion" and intolerance to some foods such as dairy, [place], ham during pregnancy.   . Provided basic meal plan, and wrote individualized menus based on patient's food preferences. Recommended avoidance of high fat meals and very high fiber foods to promote normal digestion rates.  . Instructed patient on food safety, including avoidance of Listeriosis, and limiting mercury from fish. . Instructed on treatment for hypoglycemia. . Discussed importance of maintaining healthy lifestyle habits to reduce risk of Type 2 DM as well as Gestational DM with any future pregnancies. . Advised patient to use any remaining testing supplies to test some BGs after delivery, and to have BG tested ideally annually, as well as prior to attempting future pregnancies.

## 2019-02-07 ENCOUNTER — Ambulatory Visit (INDEPENDENT_AMBULATORY_CARE_PROVIDER_SITE_OTHER): Payer: Medicaid Other | Admitting: Obstetrics and Gynecology

## 2019-02-07 ENCOUNTER — Observation Stay
Admission: EM | Admit: 2019-02-07 | Discharge: 2019-02-07 | Disposition: A | Discharge status: Home / Self Care | Payer: BC Managed Care – PPO | Attending: Obstetrics and Gynecology | Admitting: Obstetrics and Gynecology

## 2019-02-07 ENCOUNTER — Encounter: Payer: Self-pay | Admitting: Obstetrics and Gynecology

## 2019-02-07 ENCOUNTER — Other Ambulatory Visit: Payer: Self-pay

## 2019-02-07 VITALS — BP 140/82 | Wt 200.0 lb

## 2019-02-07 DIAGNOSIS — O4443 Low lying placenta NOS or without hemorrhage, third trimester: Secondary | ICD-10-CM | POA: Insufficient documentation

## 2019-02-07 DIAGNOSIS — O1213 Gestational proteinuria, third trimester: Secondary | ICD-10-CM | POA: Insufficient documentation

## 2019-02-07 DIAGNOSIS — O99213 Obesity complicating pregnancy, third trimester: Secondary | ICD-10-CM

## 2019-02-07 DIAGNOSIS — O0990 Supervision of high risk pregnancy, unspecified, unspecified trimester: Secondary | ICD-10-CM

## 2019-02-07 DIAGNOSIS — O24410 Gestational diabetes mellitus in pregnancy, diet controlled: Secondary | ICD-10-CM

## 2019-02-07 DIAGNOSIS — O24913 Unspecified diabetes mellitus in pregnancy, third trimester: Secondary | ICD-10-CM

## 2019-02-07 DIAGNOSIS — O26893 Other specified pregnancy related conditions, third trimester: Secondary | ICD-10-CM | POA: Insufficient documentation

## 2019-02-07 DIAGNOSIS — O1493 Unspecified pre-eclampsia, third trimester: Secondary | ICD-10-CM

## 2019-02-07 DIAGNOSIS — Z3A34 34 weeks gestation of pregnancy: Secondary | ICD-10-CM | POA: Insufficient documentation

## 2019-02-07 DIAGNOSIS — G932 Benign intracranial hypertension: Secondary | ICD-10-CM

## 2019-02-07 DIAGNOSIS — Z87891 Personal history of nicotine dependence: Secondary | ICD-10-CM | POA: Insufficient documentation

## 2019-02-07 DIAGNOSIS — O1403 Mild to moderate pre-eclampsia, third trimester: Secondary | ICD-10-CM | POA: Diagnosis not present

## 2019-02-07 DIAGNOSIS — Z833 Family history of diabetes mellitus: Secondary | ICD-10-CM | POA: Diagnosis not present

## 2019-02-07 DIAGNOSIS — O99353 Diseases of the nervous system complicating pregnancy, third trimester: Secondary | ICD-10-CM | POA: Insufficient documentation

## 2019-02-07 DIAGNOSIS — O3660X0 Maternal care for excessive fetal growth, unspecified trimester, not applicable or unspecified: Secondary | ICD-10-CM

## 2019-02-07 DIAGNOSIS — O0993 Supervision of high risk pregnancy, unspecified, third trimester: Secondary | ICD-10-CM

## 2019-02-07 DIAGNOSIS — O163 Unspecified maternal hypertension, third trimester: Secondary | ICD-10-CM | POA: Diagnosis present

## 2019-02-07 LAB — POCT URINALYSIS DIPSTICK OB: Glucose, UA: NEGATIVE

## 2019-02-07 LAB — COMPREHENSIVE METABOLIC PANEL
ALT: 9 U/L (ref 0–44)
AST: 16 U/L (ref 15–41)
Albumin: 2.6 g/dL — ABNORMAL LOW (ref 3.5–5.0)
Alkaline Phosphatase: 233 U/L — ABNORMAL HIGH (ref 38–126)
Anion gap: 10 (ref 5–15)
BUN: 10 mg/dL (ref 6–20)
CO2: 19 mmol/L — ABNORMAL LOW (ref 22–32)
Calcium: 8.8 mg/dL — ABNORMAL LOW (ref 8.9–10.3)
Chloride: 106 mmol/L (ref 98–111)
Creatinine, Ser: 0.52 mg/dL (ref 0.44–1.00)
GFR calc Af Amer: >60 mL/min (ref >60)
GFR calc non Af Amer: >60 mL/min (ref >60)
Glucose, Bld: 90 mg/dL (ref 70–99)
Potassium: 4.4 mmol/L (ref 3.5–5.1)
Sodium: 135 mmol/L (ref 135–145)
Total Bilirubin: 0.4 mg/dL (ref 0.3–1.2)
Total Protein: 6.4 g/dL — ABNORMAL LOW (ref 6.5–8.1)

## 2019-02-07 LAB — PROTEIN / CREATININE RATIO, URINE
Creatinine, Urine: 153 mg/dL
Protein Creatinine Ratio: 0.53 mg/mg{Cre} — ABNORMAL HIGH (ref 0.00–0.15)
Total Protein, Urine: 81 mg/dL

## 2019-02-07 LAB — CBC
HCT: 32.6 % — ABNORMAL LOW (ref 36.0–46.0)
Hemoglobin: 10.4 g/dL — ABNORMAL LOW (ref 12.0–15.0)
MCH: 26.5 pg (ref 26.0–34.0)
MCHC: 31.9 g/dL (ref 30.0–36.0)
MCV: 83 fL (ref 80.0–100.0)
Platelets: 225 10*3/uL (ref 150–400)
RBC: 3.93 MIL/uL (ref 3.87–5.11)
RDW: 13 % (ref 11.5–15.5)
WBC: 9.5 10*3/uL (ref 4.0–10.5)
nRBC: 0 % (ref 0.0–0.2)

## 2019-02-07 NOTE — Discharge Summary (Signed)
Physician Final Progress Note  Patient ID: Audrey Munoz MRN: [id number] DOB/AGE: 1995/06/22 24 y.o.  Admit date: 02/07/2019 Admitting provider: Rod Can, CNM Discharge date: 02/07/2019   Admission Diagnoses: elevated blood pressure   Discharge Diagnoses:  Active Problems:   Indication for care in labor and delivery, antepartum IUP at 34 weeks Reactive NST Preeclampsia without severe features Diet controlled GDM Co-management with [facility] Idiopathic intracranial hypertension  History of Present Illness: The patient is a 24 y.o. female G32P0020 at 42w5dwho presents from the office today for evaluation of elevated blood pressure and proteinuria. Her pregnancy is complicated by Obesity (BMI 30.0-34.9); Obesity affecting pregnancy in third trimester, antepartum; Encounter for antenatal screening for chromosomal anomalies; Low-lying placenta; Idiopathic intracranial hypertension; Diet controlled Diabetes mellitus affecting pregnancy in third trimester, Large for gestational age fetus, preeclampsia without severe features. [facility]. She has had headaches and visual disturbances related to that problem. [facility]   She reports good fetal movement. She denies leakage of fluid or vaginal bleeding. She has felt irregular contractions. In the office today her blood pressure was mildly elevated, she had 2+ proteinuria and reported headache and visual changes in the past week. She denies any current headache, visual change or epigastric pain.   Past Medical History:  Diagnosis Date  . Gestational diabetes   . Hypertension    with pregnancy  . Idiopathic intracranial hypertension   [place] a child with ?med  . Syncope     Past Surgical History:  Procedure Laterality Date  . FOOT GANGLION EXCISION     Cyst removed from foot  . FOOT SURGERY      No current  facility-administered medications on file prior to encounter.   Current Outpatient Medications on File Prior to Encounter  Medication Sig Dispense Refill  . Prenatal Vit-Fe Fumarate-FA (MULTIVITAMIN-PRENATAL) 27-0.8 MG TABS tablet Take 1 tablet by mouth daily at 12 noon.    . Accu-Chek FastClix Lancets MISC 1 each by Does not apply route 4 (four) times daily. 100 each 11  . Blood Glucose Monitoring Suppl (ACCU-CHEK GUIDE) w/Device KIT 1 each by Does not apply route 4 (four) times daily. 1 kit 0  . glucose blood (ACCU-CHEK GUIDE) test strip Use as instructed 100 each 12  . LANTUS SOLOSTAR 100 UNIT/ML Solostar Pen       No Known Allergies  Social History   Socioeconomic History  . Marital status: Single    Spouse name: Not on file  . Number of children: Not on file  . Years of education: 124 . Highest education level: Not on file  Occupational History  . Not on file  Tobacco Use  . Smoking status: Former Smoker    Packs/day: 0.50    Types: Cigarettes    Quit date: 07/16/2018    Years since quitting: 0.5  . Smokeless tobacco: Never Used  . Tobacco comment: 3 per day  Substance and Sexual Activity  . Alcohol use: Not Currently    Comment: Last ETOH 03/12/18  . Drug use: Never  . Sexual activity: Not Currently    Comment: 01/2018 last used  Other Topics Concern  . Not on file  Social History Narrative  . Not on file   Social Determinants of Health   Financial Resource Strain:   . Difficulty of Paying Living Expenses: Not on file  Food Insecurity:   . Worried About Charity fundraiser in the Last Year: Not on file  . Ran Out of Food in the Last Year: Not on file  Transportation Needs:   . Lack of Transportation (Medical): Not on file  . Lack of Transportation (Non-Medical): Not on file  Physical Activity:   . Days of Exercise per Week: Not on file  . Minutes of Exercise per Session: Not on file  Stress:   . Feeling of Stress : Not on file  Social Connections:   .  Frequency of Communication with Friends and Family: Not on file  . Frequency of Social Gatherings with Friends and Family: Not on file  . Attends Religious Services: Not on file  . Active Member of Clubs or Organizations: Not on file  . Attends Archivist Meetings: Not on file  . Marital Status: Not on file  Intimate Partner Violence:   . Fear of Current or Ex-Partner: Not on file  . Emotionally Abused: Not on file  . Physically Abused: Not on file  . Sexually Abused: Not on file    Family History  Problem Relation Age of Onset  . Asthma Father   . Seizures Sister   . Ovarian cancer Maternal Grandmother   . Cancer - Ovarian Maternal Grandmother   . Cancer Maternal Grandmother   . Diabetes Mother      Review of Systems  Constitutional: Negative.   HENT: Negative.   Eyes: Negative.   Respiratory: Negative.   Cardiovascular: Negative.   Gastrointestinal: Negative.   Genitourinary: Negative.   Musculoskeletal: Negative.   Skin: Negative.   Neurological: Negative.   Endo/Heme/Allergies: Negative.   Psychiatric/Behavioral: Negative.      Physical Exam: BP (!) 131/92   Pulse (!) 106   Temp 98.1 F (36.7 C) (Oral)   Resp 18   LMP 04/23/2018   Constitutional: Well nourished, well developed female in no acute distress.  HEENT: normal Skin: Warm and dry.  Cardiovascular: Regular rate and rhythm.   Extremity: trace edema  Respiratory: Clear to auscultation bilateral. Normal respiratory effort Abdomen: FHT present Back: no CVAT Neuro: DTRs 2+, Cranial nerves grossly intact Psych: Alert and Oriented x3. No memory deficits. Normal mood and affect.  MS: normal gait, normal bilateral lower extremity ROM/strength/stability.  Patient Vitals for the past 24 hrs:  BP Temp Temp src Pulse Resp  02/07/19 1430 (!) 131/92 -- -- (!) 106 --  02/07/19 1415 123/85 -- -- 96 --  02/07/19 1400 122/70 -- -- 98 --  02/07/19 1345 130/82 -- -- 99 --  02/07/19 1330 132/82 -- -- 98  --  02/07/19 1315 128/79 -- -- 97 --  02/07/19 1300 126/85 -- -- 95 --  02/07/19 1246 129/83 -- -- 97 --  02/07/19 1230 (!) 146/87 98.1 F (36.7 C) Oral (!) 103 18    Pelvic exam: deferred  Toco: every 6-9 minutes palpating mild Fetal well being: 130 bpm, moderate variability, +accelerations, -decelerations  Consults: None  Significant Findings/ Diagnostic Studies: labs:   Results for TOMI, GRANDPRE (MRN [id number]) as of 02/07/2019 14:56  Ref. Range 02/07/2019 11:00 02/07/2019 13:04 02/07/2019 13:20  COMPREHENSIVE METABOLIC PANEL Unknown   Rpt (A)  Sodium Latest Ref Range: 135 - 145 mmol/L   135  Potassium Latest Ref Range: 3.5 - 5.1 mmol/L   4.4  Chloride Latest Ref Range: 98 - 111 mmol/L   106  CO2 Latest Ref Range: 22 - 32 mmol/L  19 (L)  Glucose Latest Ref Range: 70 - 99 mg/dL   90  BUN Latest Ref Range: 6 - 20 mg/dL   10  Creatinine Latest Ref Range: 0.44 - 1.00 mg/dL   0.52  Calcium Latest Ref Range: 8.9 - 10.3 mg/dL   8.8 (L)  Anion gap Latest Ref Range: 5 - 15    10  Alkaline Phosphatase Latest Ref Range: 38 - 126 U/L   233 (H)  Albumin Latest Ref Range: 3.5 - 5.0 g/dL   2.6 (L)  AST Latest Ref Range: 15 - 41 U/L   16  ALT Latest Ref Range: 0 - 44 U/L   9  Total Protein Latest Ref Range: 6.5 - 8.1 g/dL   6.4 (L)  Total Bilirubin Latest Ref Range: 0.3 - 1.2 mg/dL   0.4  GFR, Est Non African American Latest Ref Range: >60 mL/min   >60  GFR, Est African American Latest Ref Range: >60 mL/min   >60  WBC Latest Ref Range: 4.0 - 10.5 K/uL   9.5  RBC Latest Ref Range: 3.87 - 5.11 MIL/uL   3.93  Hemoglobin Latest Ref Range: 12.0 - 15.0 g/dL   10.4 (L)  HCT Latest Ref Range: 36.0 - 46.0 %   32.6 (L)  MCV Latest Ref Range: 80.0 - 100.0 fL   83.0  MCH Latest Ref Range: 26.0 - 34.0 pg   26.5  MCHC Latest Ref Range: 30.0 - 36.0 g/dL   31.9  RDW Latest Ref Range: 11.5 - 15.5 %   13.0  Platelets Latest Ref Range: 150 - 400 K/uL   225  nRBC Latest Ref Range: 0.0 - 0.2 %   0.0   Glucose, UA Latest Ref Range: Negative  Negative    Protein Latest Ref Range: Negative, Trace, Small (1+), Moderate (2+), Large (3+), 4+  Moderate (2+)    Total Protein, Urine Latest Units: mg/dL  81   Protein Creatinine Ratio Latest Ref Range: 0.00 - 0.15 mg/mgCre  0.53 (H)   Creatinine, Urine Latest Units: mg/dL  153     Procedures: NST  Hospital Course: The patient was admitted to Labor and Delivery Triage for observation.   Discharge Condition: good  Disposition: Discharge disposition: 01-Home or Self Care  Diet: Diabetic diet, continue GDM care as prescribed by Ob  Discharge Activity: Activity as tolerated  Patient discharged with preeclampsia precautions  Discharge Instructions    Discharge activity:  No Restrictions   Complete by: As directed    Discharge diet:  No restrictions   Complete by: As directed    Fetal Kick Count:  Lie on our left side for one hour after a meal, and count the number of times your baby kicks.  If it is less than 5 times, get up, move around and drink some juice.  Repeat the test 30 minutes later.  If it is still less than 5 kicks in an hour, notify your doctor.   Complete by: As directed    No sexual activity restrictions   Complete by: As directed    Notify physician for a general feeling that "something is not right"   Complete by: As directed    Notify physician for increase or change in vaginal discharge   Complete by: As directed    Notify physician for intestinal cramps, with or without diarrhea, sometimes described as "gas pain"   Complete by: As directed    Notify physician for leaking of fluid   Complete by:  As directed    Notify physician for low, dull backache, unrelieved by heat or Tylenol   Complete by: As directed    Notify physician for menstrual like cramps   Complete by: As directed    Notify physician for pelvic pressure   Complete by: As directed    Notify physician for uterine contractions.  These may be painless and  feel like the uterus is tightening or the baby is  "balling up"   Complete by: As directed    Notify physician for vaginal bleeding   Complete by: As directed    PRETERM LABOR:  Includes any of the follwing symptoms that occur between 20 - [redacted] weeks gestation.  If these symptoms are not stopped, preterm labor can result in preterm delivery, placing your baby at risk   Complete by: As directed      Allergies as of 02/07/2019   No Known Allergies     Medication List    TAKE these medications   Accu-Chek FastClix Lancets Misc 1 each by Does not apply route 4 (four) times daily.   Accu-Chek Guide test strip Generic drug: glucose blood Use as instructed   Accu-Chek Guide w/Device Kit 1 each by Does not apply route 4 (four) times daily.   Lantus SoloStar 100 UNIT/ML Solostar Pen Generic drug: Insulin Glargine   multivitamin-prenatal 27-0.8 MG Tabs tablet Take 1 tablet by mouth daily at 12 noon.      [place]. Go in 4 day(s).   Specialty: Obstetrics and Gynecology Why: nst and bp check with MD/Spanish Interpreter needed Contact information: [address] [place] [address] [phone]          Total time spent taking care of this patient: 20 minutes  Signed: Rod Can, CNM  02/07/2019, 2:38 PM

## 2019-02-07 NOTE — Progress Notes (Signed)
Routine Prenatal Care Visit  Subjective  Audrey Munoz is a 24 y.o. G3P0020 at [redacted]w[redacted]d being seen today for ongoing prenatal care.  She is currently monitored for the following issues for this high-risk pregnancy and has Supervision of high risk pregnancy, antepartum; Obesity (BMI 30.0-34.9); Obesity affecting pregnancy in third trimester, antepartum; Encounter for antenatal screening for chromosomal anomalies; Idiopathic intracranial hypertension; Diabetes mellitus affecting pregnancy in third trimester; and Large for gestational age fetus affecting management of mother, antepartum on their problem list.  ----------------------------------------------------------------------------------- Patient reports headache.   Contractions: Irregular. Vag. Bleeding: None.  Movement: Present. Denies leaking of fluid.  ----------------------------------------------------------------------------------- The following portions of the patient's history were reviewed and updated as appropriate: allergies, current medications, past family history, past medical history, past social history, past surgical history and problem list. Problem list updated.   Objective  Blood pressure 140/82, weight 200 lb (90.7 kg), last menstrual period 04/23/2018, unknown if currently breastfeeding. Pregravid weight Pregravid weight not on file Total Weight Gain Not found. Urinalysis:      Fetal Status: Fetal Heart Rate (bpm): 145 Fundal Height: 37 cm Movement: Present  Presentation: Vertex  General:  Alert, oriented and cooperative. Patient is in no acute distress.  Skin: Skin is warm and dry. No rash noted.   Cardiovascular: Normal heart rate noted  Respiratory: Normal respiratory effort, no problems with respiration noted  Abdomen: Soft, gravid, appropriate for gestational age. Pain/Pressure: Present     Pelvic:  Cervical exam deferred        Extremities: Normal range of motion.     ental Status: Normal mood and  affect. Normal behavior. Normal judgment and thought content.   Date Fasting Breakfast Lunch Dinner  1/05 111 117 99 118  1/06 88 143 121 147 (106)  1/07 93 103 110 143  1/08 88 99 97 141  1/09 89 110 127 127  1/10 84 86 102 127  1/11 101       Assessment   24 y.o. G3P0020 at [redacted]w[redacted]d by  03/16/2019, by Ultrasound presenting for routine prenatal visit  Plan   pregnancy3 Problems (from 04/23/18 to present)    Problem Noted Resolved   Large for gestational age fetus affecting management of mother, antepartum 01/24/2019 by Malachy Mood, MD No   Overview Addendum 01/31/2019  8:44 PM by Homero Fellers, MD    01/17/2019: EFW 90%: 2517 g (5 lbs 9 oz)      Diabetes mellitus affecting pregnancy in third trimester 01/20/2019 by Homero Fellers, MD No   Overview Addendum 01/31/2019  8:42 PM by Homero Fellers, MD    Diet controlled gestational diabetic      Supervision of high risk pregnancy, antepartum 08/05/2018 by Lora Havens, PA-C No   Overview Addendum 01/31/2019  8:51 PM by Homero Fellers, MD     Nursing Staff Provider  Office Location  [facility] Dating  [place] 8 wks  Language  Spanish Anatomy US  [facility] MFM [place] 20 wks  Flu Vaccine  10/9 Genetic Screen  AFP only Neg  TDaP vaccine   01/17/2019 Hgb A1C or  GTT Third trimester: 3hr GTT, all values elevated  Rhogam  n/a   LAB RESULTS   Feeding Plan  breast Blood Type O/Positive/-- (07/09 1639)   Contraception  Antibody Negative (07/09 1639)  Circumcision  Rubella 1.42 (07/09 1639)  Pediatrician   RPR Non Reactive (07/09 1639)   Support Person  HBsAg Negative (07/09 1639)   Prenatal Classes  HIV Non-reactive (07/09 0000)  BTL Consent No, n/a GBS  (For PCN allergy, check sensitivities)   VBAC Consent n/a Pap  07/2018 normal        Low-lying placenta 10/15/2018 by Federico Flake, MD 02/03/2019 by Lady Deutscher, MD   Overview Addendum 01/24/2019  2:13 PM by Vena Austria, MD    <2cm from internal os at  18wk - resolved at [redacted] weeks          Gestational age appropriate obstetric precautions including but not limited to vaginal bleeding, contractions, leaking of fluid and fetal movement were reviewed in detail with the patient.    - to L&D for [facility] evaluation as patient reports headaches and vision changes over past week.  Mild range BP with 2+ protein here in the office today. - BG remain well controlled other than dinner time values trending up, told to monitor dinner meals  Return in about 1 week (around 02/14/2019) for ROB.  Vena Austria, MD, [facility], [facility] 02/07/2019, 11:34 AM

## 2019-02-07 NOTE — Progress Notes (Signed)
ROB Headache/blurred vision

## 2019-02-09 ENCOUNTER — Ambulatory Visit (INDEPENDENT_AMBULATORY_CARE_PROVIDER_SITE_OTHER): Payer: BC Managed Care – PPO | Admitting: Advanced Practice Midwife

## 2019-02-09 ENCOUNTER — Encounter: Payer: Self-pay | Admitting: Advanced Practice Midwife

## 2019-02-09 ENCOUNTER — Other Ambulatory Visit: Payer: Self-pay

## 2019-02-09 VITALS — BP 140/80 | Wt 199.0 lb

## 2019-02-09 DIAGNOSIS — O0990 Supervision of high risk pregnancy, unspecified, unspecified trimester: Secondary | ICD-10-CM

## 2019-02-09 DIAGNOSIS — Z3A35 35 weeks gestation of pregnancy: Secondary | ICD-10-CM

## 2019-02-09 DIAGNOSIS — O1493 Unspecified pre-eclampsia, third trimester: Secondary | ICD-10-CM

## 2019-02-09 LAB — FETAL NONSTRESS TEST

## 2019-02-09 LAB — POCT URINALYSIS DIPSTICK OB: Glucose, UA: NEGATIVE

## 2019-02-09 NOTE — Progress Notes (Signed)
Routine Prenatal Care Visit  Subjective  BP check after hospital visit  Audrey Munoz is a 24 y.o. G3P0020 at [redacted]w[redacted]d being seen today for ongoing prenatal care.  She is currently monitored for the following issues for this high-risk pregnancy and has Supervision of high risk pregnancy, antepartum; Obesity (BMI 30.0-34.9); Obesity affecting pregnancy in third trimester, antepartum; Encounter for antenatal screening for chromosomal anomalies; Idiopathic intracranial hypertension; Diabetes mellitus affecting pregnancy in third trimester; Large for gestational age fetus affecting management of mother, antepartum; Indication for care in labor and delivery, antepartum; and Preeclampsia, third trimester on their problem list.  ----------------------------------------------------------------------------------- Patient reports no current headache or RUQ pain. Her blurry vision is getting worse. She has an appointment with [facility] tomorrow. She has pain in her knees. We discussed POC with interpreter present. She has f/u with WS MD and DP also for delivery planning.  Contractions: Irregular. Vag. Bleeding: None.  Movement: Present. Leaking Fluid denies.  ----------------------------------------------------------------------------------- The following portions of the patient's history were reviewed and updated as appropriate: allergies, current medications, past family history, past medical history, past social history, past surgical history and problem list. Problem list updated.  Objective  Blood pressure 140/80, weight 199 lb (90.3 kg), last menstrual period 04/23/2018 Pregravid weight Pregravid weight not on file Total Weight Gain Not found. Urinalysis: Urine Protein Large (3+)  Urine Glucose Negative  Fetal Status: Fetal Heart Rate (bpm): 140   Movement: Present      NST: reactive 20 minute tracing, baseline 140 bpm, moderate variability, +accelerations, -decelerations BS log not available.  Patient reports normal values.  General:  Alert, oriented and cooperative. Patient is in no acute distress.  Skin: Skin is warm and dry. No rash noted.   Cardiovascular: Normal heart rate noted  Respiratory: Normal respiratory effort, no problems with respiration noted  Abdomen: Soft, gravid, appropriate for gestational age. Pain/Pressure: Present     Pelvic:  Cervical exam deferred        Extremities: Normal range of motion.  Edema: None  Mental Status: Normal mood and affect. Normal behavior. Normal judgment and thought content.   Assessment   24 y.o. G3P0020 at [redacted]w[redacted]d by  03/16/2019, by Ultrasound presenting for work-in prenatal visit  Plan   pregnancy3 Problems (from 04/23/18 to present)    Problem Noted Resolved   Preeclampsia, third trimester 02/07/2019 by Rod Can, CNM No   Large for gestational age fetus affecting management of mother, antepartum 01/24/2019 by Malachy Mood, MD No   Overview Addendum 01/31/2019  8:44 PM by Homero Fellers, MD    01/17/2019: EFW 90%: 2517 g (5 lbs 9 oz)      Diabetes mellitus affecting pregnancy in third trimester 01/20/2019 by Homero Fellers, MD No   Overview Addendum 01/31/2019  8:42 PM by Homero Fellers, MD    Diet controlled gestational diabetic      Supervision of high risk pregnancy, antepartum 08/05/2018 by Lora Havens, PA-C No   Overview Addendum 01/31/2019  8:51 PM by Homero Fellers, MD     Nursing Staff Provider  Office Location  [facility] Dating  [place] 8 wks  Language  Spanish Anatomy US  DP MFM [place] 20 wks  Flu Vaccine  10/9 Genetic Screen  AFP only Neg  TDaP vaccine   01/17/2019 Hgb A1C or  GTT Third trimester: 3hr GTT, all values elevated  Rhogam  n/a   LAB RESULTS   Feeding Plan  breast Blood Type O/Positive/-- (07/09 1639)  Contraception  Antibody Negative (07/09 1639)  Circumcision  Rubella 1.42 (07/09 1639)  Pediatrician   RPR Non Reactive (07/09 1639)   Support Person  HBsAg Negative  (07/09 1639)   Prenatal Classes  HIV Non-reactive (07/09 0000)  BTL Consent No, n/a GBS  (For PCN allergy, check sensitivities)   VBAC Consent n/a Pap  07/2018 normal        Low-lying placenta 10/15/2018 by Federico Flake, MD 02/03/2019 by Lady Deutscher, MD   Overview Addendum 01/24/2019  2:13 PM by Vena Austria, MD    <2cm from internal os at 18wk - resolved at 28 weeks          Preterm labor symptoms and general obstetric precautions including but not limited to vaginal bleeding, contractions, leaking of fluid and fetal movement were reviewed in detail with the patient. Scheduled f/u with [facility] tomorrow  Return in about 5 days (around 02/14/2019) for has follow up scheduled.  Tresea Mall, CNM 02/09/2019 11:36 AM

## 2019-02-14 ENCOUNTER — Encounter: Payer: Self-pay | Admitting: Obstetrics and Gynecology

## 2019-02-14 ENCOUNTER — Encounter
Admission: RE | Admit: 2019-02-14 | Discharge: 2019-02-14 | Disposition: A | Discharge status: Home / Self Care | Payer: BC Managed Care – PPO | Source: Ambulatory Visit | Attending: Anesthesiology | Admitting: Anesthesiology

## 2019-02-14 ENCOUNTER — Ambulatory Visit (INDEPENDENT_AMBULATORY_CARE_PROVIDER_SITE_OTHER): Payer: BC Managed Care – PPO | Admitting: Obstetrics and Gynecology

## 2019-02-14 ENCOUNTER — Other Ambulatory Visit: Payer: Self-pay

## 2019-02-14 ENCOUNTER — Other Ambulatory Visit: Payer: Self-pay | Admitting: Obstetrics & Gynecology

## 2019-02-14 ENCOUNTER — Other Ambulatory Visit (HOSPITAL_COMMUNITY)
Admission: RE | Admit: 2019-02-14 | Discharge: 2019-02-14 | Disposition: A | Discharge status: Home / Self Care | Payer: BC Managed Care – PPO | Source: Ambulatory Visit | Attending: Obstetrics and Gynecology | Admitting: Obstetrics and Gynecology

## 2019-02-14 VITALS — BP 144/84 | Wt 200.0 lb

## 2019-02-14 DIAGNOSIS — Z3A35 35 weeks gestation of pregnancy: Secondary | ICD-10-CM | POA: Diagnosis not present

## 2019-02-14 DIAGNOSIS — O24913 Unspecified diabetes mellitus in pregnancy, third trimester: Secondary | ICD-10-CM

## 2019-02-14 DIAGNOSIS — O1493 Unspecified pre-eclampsia, third trimester: Secondary | ICD-10-CM

## 2019-02-14 DIAGNOSIS — O0990 Supervision of high risk pregnancy, unspecified, unspecified trimester: Secondary | ICD-10-CM

## 2019-02-14 DIAGNOSIS — O0993 Supervision of high risk pregnancy, unspecified, third trimester: Secondary | ICD-10-CM

## 2019-02-14 DIAGNOSIS — G932 Benign intracranial hypertension: Secondary | ICD-10-CM

## 2019-02-14 DIAGNOSIS — O99213 Obesity complicating pregnancy, third trimester: Secondary | ICD-10-CM

## 2019-02-14 NOTE — Progress Notes (Signed)
Routine Prenatal Care Visit  Subjective  Audrey Munoz is a 24 y.o. G3P0020 at [redacted]w[redacted]d being seen today for ongoing prenatal care.  She is currently monitored for the following issues for this high-risk pregnancy and has Supervision of high risk pregnancy, antepartum; Obesity (BMI 30.0-34.9); Obesity affecting pregnancy in third trimester, antepartum; Encounter for antenatal screening for chromosomal anomalies; Idiopathic intracranial hypertension; Diabetes mellitus affecting pregnancy in third trimester; Large for gestational age fetus affecting management of mother, antepartum; Indication for care in labor and delivery, antepartum; and Preeclampsia, third trimester on their problem list.  ----------------------------------------------------------------------------------- Patient reports continued blurry vision and mild headaches. Diamox is not helping with her headaches. She has a follow up with [facility] on Friday by telephone. She sees anesthesia today.   Contractions: Irregular. Vag. Bleeding: None.  Movement: Present. Leaking Fluid denies.  IIH: continue medication.  Keep follow up appts. Mild preeclampsia: difficult to discern severe features given her ongoing symptoms of headache and blurry vision.  Strong precautions given. GDMA2: see low below. No lows.       ----------------------------------------------------------------------------------- The following portions of the patient's history were reviewed and updated as appropriate: allergies, current medications, past family history, past medical history, past social history, past surgical history and problem list. Problem list updated.  Objective  Blood pressure (!) 144/84, weight 200 lb (90.7 kg), last menstrual period 04/23/2018, unknown if currently breastfeeding. Pregravid weight Pregravid weight not on file Total Weight Gain Not found. Urinalysis: Urine Protein    Urine Glucose    Fetal Status: Fetal Heart Rate (bpm): 145    Movement: Present     General:  Alert, oriented and cooperative. Patient is in no acute distress.  Skin: Skin is warm and dry. No rash noted.   Cardiovascular: Normal heart rate noted  Respiratory: Normal respiratory effort, no problems with respiration noted  Abdomen: Soft, gravid, appropriate for gestational age. Pain/Pressure: Present     Pelvic:  Cervical exam deferred        Extremities: Normal range of motion.  Edema: None  Mental Status: Normal mood and affect. Normal behavior. Normal judgment and thought content.   NST Baseline FHR: 145 beats/min Variability: moderate Accelerations: present Decelerations: absent Tocometry: not done  Interpretation:  INDICATIONS: gestational diabetes mellitus and preeclampsia without severe features RESULTS:  A NST procedure was performed with FHR monitoring and a normal baseline established, appropriate time of 20-40 minutes of evaluation, and accels >2 seen w 15x15 characteristics.  Results show a REACTIVE NST.    Assessment   24 y.o. G3P0020 at [redacted]w[redacted]d by  03/16/2019, by Ultrasound presenting for routine prenatal visit  Plan   pregnancy3 Problems (from 04/23/18 to present)    Problem Noted Resolved   Preeclampsia, third trimester 02/07/2019 by Rod Can, CNM No   Large for gestational age fetus affecting management of mother, antepartum 01/24/2019 by Malachy Mood, MD No   Overview Addendum 01/31/2019  8:44 PM by Homero Fellers, MD    01/17/2019: EFW 90%: 2517 g (5 lbs 9 oz)      Diabetes mellitus affecting pregnancy in third trimester 01/20/2019 by Homero Fellers, MD No   Overview Addendum 01/31/2019  8:42 PM by Homero Fellers, MD    Diet controlled gestational diabetic      Supervision of high risk pregnancy, antepartum 08/05/2018 by Lora Havens, PA-C No   Overview Addendum 01/31/2019  8:51 PM by Homero Fellers, MD     Nursing Staff Provider  Office Location  [facility]  Dating  [place] 8 wks  Language   Spanish Anatomy US  DP MFM [place] 20 wks  Flu Vaccine  10/9 Genetic Screen  AFP only Neg  TDaP vaccine   01/17/2019 Hgb A1C or  GTT Third trimester: 3hr GTT, all values elevated  Rhogam  n/a   LAB RESULTS   Feeding Plan  breast Blood Type O/Positive/-- (07/09 1639)   Contraception  Antibody Negative (07/09 1639)  Circumcision  Rubella 1.42 (07/09 1639)  Pediatrician   RPR Non Reactive (07/09 1639)   Support Person  HBsAg Negative (07/09 1639)   Prenatal Classes  HIV Non-reactive (07/09 0000)  BTL Consent No, n/a GBS  (For PCN allergy, check sensitivities)   VBAC Consent n/a Pap  07/2018 normal        Low-lying placenta 10/15/2018 by Federico Flake, MD 02/03/2019 by Lady Deutscher, MD   Overview Addendum 01/24/2019  2:13 PM by Vena Austria, MD    <2cm from internal os at 18wk - resolved at 28 weeks          Preterm labor symptoms and general obstetric precautions including but not limited to vaginal bleeding, contractions, leaking of fluid and fetal movement were reviewed in detail with the patient. Please refer to After Visit Summary for other counseling recommendations.   - increase Lantus to 25 units HS - strict precautions given for preeclampsia with severe features. In her case, worsening of her symptoms - keep all other appts - will plan for IOL at 37 weeks if nothing else changes - GBS/aptima today - needs twice weekly APT!  Return in about 3 days (around 02/17/2019) for U/S for AFI and ROB/NST with MD.  Thomasene Mohair, MD, [facility], [facility] 02/14/2019 10:00 AM

## 2019-02-14 NOTE — Consult Note (Signed)
[facility] Anesthesia Consultation  Sherlynn Tourville QZE:[id number] DOB: October 10, 1995 DOA: 02/14/2019 PCP: Patient, No Pcp Per   Requesting physician: Dr. Glennon Mac Date of consultation: 02/14/19 Reason for consultation: Idiopathic intracranial hypertension  CHIEF COMPLAINT:  Idiopathic intracranial hypertension  HISTORY OF PRESENT ILLNESS: Audrey Munoz  is a 24 y.o. female with a known history of idiopathic intracranial hypertension. She started having headaches earlier in pregnancy and after workup with imaging and lumbar puncture was diagnosed with idiopathic intracranial hypertension. She recently saw neurology at [facility] and was started on acetazolamide. This is her first term pregnancy and is concerned about labor in the setting of her headaches. Final plan for delivery not yet set. Is also being followed for preeclampsia with biweekly appointments. Says she's supposed to discuss delivery plans at her next OB appointment.   PAST MEDICAL HISTORY:   Past Medical History:  Diagnosis Date  . Gestational diabetes   . Hypertension    with pregnancy  . Idiopathic intracranial hypertension   . Seizures ([place])    Seizures as a child with ?med  . Syncope     PAST SURGICAL HISTORY:  Past Surgical History:  Procedure Laterality Date  . FOOT GANGLION EXCISION     Cyst removed from foot  . FOOT SURGERY      SOCIAL HISTORY:  Social History   Tobacco Use  . Smoking status: Former Smoker    Packs/day: 0.50    Types: Cigarettes    Quit date: 07/16/2018    Years since quitting: 0.5  . Smokeless tobacco: Never Used  . Tobacco comment: 3 per day  Substance Use Topics  . Alcohol use: Not Currently    Comment: Last ETOH 03/12/18    FAMILY HISTORY:  Family History  Problem Relation Age of Onset  . Asthma Father   . Seizures Sister   . Ovarian cancer Maternal Grandmother   . Cancer - Ovarian Maternal Grandmother   . Cancer Maternal Grandmother    . Diabetes Mother     DRUG ALLERGIES: No Known Allergies  REVIEW OF SYSTEMS:   RESPIRATORY: No cough, shortness of breath, wheezing.  CARDIOVASCULAR: No chest pain, orthopnea, edema.  HEMATOLOGY: No anemia, easy bruising or bleeding SKIN: No rash or lesion. NEUROLOGIC: No tingling, numbness, weakness.  PSYCHIATRY: No anxiety or depression.   MEDICATIONS AT HOME:  Prior to Admission medications   Medication Sig Start Date End Date Taking? Authorizing Provider  Accu-Chek FastClix Lancets MISC 1 each by Does not apply route 4 (four) times daily. 01/20/19   Schuman, Stefanie Libel, MD  Blood Glucose Monitoring Suppl (ACCU-CHEK GUIDE) w/Device KIT 1 each by Does not apply route 4 (four) times daily. 01/20/19   Schuman, Renold Don R, MD  glucose blood (ACCU-CHEK GUIDE) test strip Use as instructed 01/20/19   Homero Fellers, MD  LANTUS SOLOSTAR 100 UNIT/ML Solostar Pen  02/03/19   [provider]  Prenatal Vit-Fe Fumarate-FA (MULTIVITAMIN-PRENATAL) 27-0.8 MG TABS tablet Take 1 tablet by mouth daily at 12 noon.    [provider]      PHYSICAL EXAMINATION:   VITAL SIGNS: Last menstrual period 04/23/2018, unknown if currently breastfeeding.  GENERAL:  24 y.o.-year-old patient no acute distress.  HEENT: Head atraumatic, normocephalic.  LUNGS: Normal breath sounds bilaterally, no wheezing, rales,rhonchi. No use of accessory muscles of respiration.  CARDIOVASCULAR: S1, S2 normal. No murmurs, rubs, or gallops.  EXTREMITIES: No pedal edema, cyanosis, or clubbing.  NEUROLOGIC: normal gait PSYCHIATRIC: The patient is alert  and oriented x 3.  SKIN: No obvious rash, lesion, or ulcer.    IMPRESSION AND PLAN:   Audrey Munoz  is a 24 y.o. female presenting with idiopathic intracranial hypertension during pregnancy.   Neuraxial analgesia/anesthesia not contraindicated. We discussed analgesic options during labor including epidural analgesia. Discussed use of  epidural vs spinal vs GA if cesarean delivery is required. Discussed increased risk of difficult intubation during pregnancy should an emergency cesarean delivery be required.

## 2019-02-15 LAB — CERVICOVAGINAL ANCILLARY ONLY
Chlamydia: NEGATIVE
Comment: NEGATIVE
Comment: NORMAL
Neisseria Gonorrhea: NEGATIVE

## 2019-02-16 LAB — STREP GP B NAA: Strep Gp B NAA: NEGATIVE

## 2019-02-17 ENCOUNTER — Ambulatory Visit (INDEPENDENT_AMBULATORY_CARE_PROVIDER_SITE_OTHER): Payer: BC Managed Care – PPO | Admitting: Obstetrics and Gynecology

## 2019-02-17 ENCOUNTER — Other Ambulatory Visit: Payer: Self-pay

## 2019-02-17 ENCOUNTER — Encounter: Payer: Self-pay | Admitting: Obstetrics and Gynecology

## 2019-02-17 ENCOUNTER — Ambulatory Visit (INDEPENDENT_AMBULATORY_CARE_PROVIDER_SITE_OTHER): Payer: BC Managed Care – PPO

## 2019-02-17 VITALS — BP 120/88 | Wt 202.0 lb

## 2019-02-17 DIAGNOSIS — G932 Benign intracranial hypertension: Secondary | ICD-10-CM | POA: Diagnosis not present

## 2019-02-17 DIAGNOSIS — Z3A35 35 weeks gestation of pregnancy: Secondary | ICD-10-CM

## 2019-02-17 DIAGNOSIS — O99213 Obesity complicating pregnancy, third trimester: Secondary | ICD-10-CM | POA: Diagnosis not present

## 2019-02-17 DIAGNOSIS — O24410 Gestational diabetes mellitus in pregnancy, diet controlled: Secondary | ICD-10-CM

## 2019-02-17 DIAGNOSIS — O1493 Unspecified pre-eclampsia, third trimester: Secondary | ICD-10-CM | POA: Diagnosis not present

## 2019-02-17 DIAGNOSIS — O0990 Supervision of high risk pregnancy, unspecified, unspecified trimester: Secondary | ICD-10-CM

## 2019-02-17 DIAGNOSIS — O0993 Supervision of high risk pregnancy, unspecified, third trimester: Secondary | ICD-10-CM

## 2019-02-17 DIAGNOSIS — O24913 Unspecified diabetes mellitus in pregnancy, third trimester: Secondary | ICD-10-CM | POA: Diagnosis not present

## 2019-02-17 DIAGNOSIS — Z3A36 36 weeks gestation of pregnancy: Secondary | ICD-10-CM | POA: Diagnosis not present

## 2019-02-17 LAB — FETAL NONSTRESS TEST

## 2019-02-17 NOTE — Progress Notes (Signed)
   ROB- vision is still blurry

## 2019-02-17 NOTE — Progress Notes (Signed)
Routine Prenatal Care Visit  Subjective  Audrey Munoz is a 24 y.o. G3P0020 at [redacted]w[redacted]d being seen today for ongoing prenatal care.  She is currently monitored for the following issues for this high-risk pregnancy and has Supervision of high risk pregnancy, antepartum; Obesity (BMI 30.0-34.9); Obesity affecting pregnancy in third trimester, antepartum; Encounter for antenatal screening for chromosomal anomalies; Idiopathic intracranial hypertension; Diabetes mellitus affecting pregnancy in third trimester; Large for gestational age fetus affecting management of mother, antepartum; Indication for care in labor and delivery, antepartum; and Preeclampsia, third trimester on their problem list.  ----------------------------------------------------------------------------------- Patient reports that she stopped taking Diamox on Sunday. She has still been having blurry vision. She has a phone follow up tomorrow with neurology. She is seeing MFM next week. She has been checking her blood glucose which have been moderately well controlled other than continued elevated fasting values . Blood glucose reviewed on phone  84-134 Contractions: Irregular. Vag. Bleeding: None.  Movement: Present. Denies leaking of fluid.  ----------------------------------------------------------------------------------- The following portions of the patient's history were reviewed and updated as appropriate: allergies, current medications, past family history, past medical history, past social history, past surgical history and problem list. Problem list updated.   Objective  Blood pressure 120/88, weight 202 lb (91.6 kg), last menstrual period 04/23/2018, unknown if currently breastfeeding. Pregravid weight 157 lb (71.2 kg) Total Weight Gain 45 lb (20.4 kg) Urinalysis:      Fetal Status: Fetal Heart Rate (bpm): 145   Movement: Present     General:  Alert, oriented and cooperative. Patient is in no acute distress.  Skin:  Skin is warm and dry. No rash noted.   Cardiovascular: Normal heart rate noted  Respiratory: Normal respiratory effort, no problems with respiration noted  Abdomen: Soft, gravid, appropriate for gestational age. Pain/Pressure: Present     Pelvic:  Cervical exam deferred        Extremities: Normal range of motion.  Edema: Trace  Mental Status: Normal mood and affect. Normal behavior. Normal judgment and thought content.    NST: 145 bpm baseline, moderate variability, 15x15 accelerations, no decelerations.   Assessment   24 y.o. G3P0020 at [redacted]w[redacted]d by  03/16/2019, by Ultrasound presenting for routine prenatal visit  Plan   pregnancy3 Problems (from 04/23/18 to present)    Problem Noted Resolved   Preeclampsia, third trimester 02/07/2019 by Tresea Mall, CNM No   Large for gestational age fetus affecting management of mother, antepartum 01/24/2019 by Vena Austria, MD No   Overview Addendum 01/31/2019  8:44 PM by Natale Milch, MD    01/17/2019: EFW 90%: 2517 g (5 lbs 9 oz)      Diabetes mellitus affecting pregnancy in third trimester 01/20/2019 by Natale Milch, MD No   Overview Addendum 01/31/2019  8:42 PM by Natale Milch, MD    Diet controlled gestational diabetic      Supervision of high risk pregnancy, antepartum 08/05/2018 by Landry Dyke, PA-C No   Overview Addendum 01/31/2019  8:51 PM by Natale Milch, MD     Nursing Staff Provider  Office Location  [facility] Dating  [place] 8 wks  Language  Spanish Anatomy US  DP MFM [place] 20 wks  Flu Vaccine  10/9 Genetic Screen  AFP only Neg  TDaP vaccine   01/17/2019 Hgb A1C or  GTT Third trimester: 3hr GTT, all values elevated  Rhogam  n/a   LAB RESULTS   Feeding Plan  breast Blood Type O/Positive/-- (07/09 1639)  Contraception  Antibody Negative (07/09 1639)  Circumcision  Rubella 1.42 (07/09 1639)  Pediatrician   RPR Non Reactive (07/09 1639)   Support Person  HBsAg Negative (07/09 1639)   Prenatal  Classes  HIV Non-reactive (07/09 0000)  BTL Consent No, n/a GBS  (For PCN allergy, check sensitivities)   VBAC Consent n/a Pap  07/2018 normal        Low-lying placenta 10/15/2018 by Caren Macadam, MD 02/03/2019 by Dellia Nims, MD   Overview Addendum 01/24/2019  2:13 PM by Malachy Mood, MD    <2cm from internal os at [place] - resolved at [redacted] weeks          Gestational age appropriate obstetric precautions including but not limited to vaginal bleeding, contractions, leaking of fluid and fetal movement were reviewed in detail with the patient.    Encouraged patient to restart Diamox.  Discussed that she may need an increased dose and that it can take several days to start seeing improvement in symptoms.  Encouraged her to keep her appointment with neurology.  Discussed the importance of the medicine to help preserve her eyesight and prevent blindness.   She reports that next week she has appointment scheduled on the same day in [place] and wonders if they can be scheduled in the same location.  Discussed with Maudie Mercury and asked to see if she could contact these offices for the patient.  Discussed with the patient that rescheduling may not be possible.   Glucose levels seem well controlled with current regiment no evidence of macrosomia thus far. Has repeat growth ultrasound scheduled for next week.  Delivery recommendations will be based off of glucose control and growth ultrasound.   Blood pressures well controlled today.   Return for Visits as planned.  Homero Fellers MD [facility], [facility] 02/18/2019, 12:41 PM

## 2019-02-21 ENCOUNTER — Other Ambulatory Visit: Payer: Self-pay

## 2019-02-21 ENCOUNTER — Ambulatory Visit (INDEPENDENT_AMBULATORY_CARE_PROVIDER_SITE_OTHER): Payer: BC Managed Care – PPO | Admitting: Obstetrics and Gynecology

## 2019-02-21 ENCOUNTER — Encounter: Payer: Self-pay | Admitting: Obstetrics and Gynecology

## 2019-02-21 VITALS — BP 144/86 | Wt 203.0 lb

## 2019-02-21 DIAGNOSIS — O3663X0 Maternal care for excessive fetal growth, third trimester, not applicable or unspecified: Secondary | ICD-10-CM | POA: Diagnosis not present

## 2019-02-21 DIAGNOSIS — Z3A36 36 weeks gestation of pregnancy: Secondary | ICD-10-CM | POA: Diagnosis not present

## 2019-02-21 DIAGNOSIS — O3660X0 Maternal care for excessive fetal growth, unspecified trimester, not applicable or unspecified: Secondary | ICD-10-CM

## 2019-02-21 DIAGNOSIS — Z6835 Body mass index (BMI) 35.0-35.9, adult: Secondary | ICD-10-CM

## 2019-02-21 DIAGNOSIS — O0990 Supervision of high risk pregnancy, unspecified, unspecified trimester: Secondary | ICD-10-CM

## 2019-02-21 DIAGNOSIS — O1493 Unspecified pre-eclampsia, third trimester: Secondary | ICD-10-CM | POA: Diagnosis not present

## 2019-02-21 DIAGNOSIS — O24913 Unspecified diabetes mellitus in pregnancy, third trimester: Secondary | ICD-10-CM

## 2019-02-21 DIAGNOSIS — O24410 Gestational diabetes mellitus in pregnancy, diet controlled: Secondary | ICD-10-CM | POA: Diagnosis not present

## 2019-02-21 DIAGNOSIS — G932 Benign intracranial hypertension: Secondary | ICD-10-CM

## 2019-02-21 DIAGNOSIS — O99213 Obesity complicating pregnancy, third trimester: Secondary | ICD-10-CM

## 2019-02-21 DIAGNOSIS — O0993 Supervision of high risk pregnancy, unspecified, third trimester: Secondary | ICD-10-CM

## 2019-02-21 NOTE — Progress Notes (Signed)
Routine Prenatal Care Visit  Subjective  Audrey Munoz is a 24 y.o. G3P0020 at [redacted]w[redacted]d being seen today for ongoing prenatal care.  She is currently monitored for the following issues for this high-risk pregnancy and has Supervision of high risk pregnancy, antepartum; Obesity (BMI 30.0-34.9); Obesity affecting pregnancy in third trimester, antepartum; Encounter for antenatal screening for chromosomal anomalies; Idiopathic intracranial hypertension; Diabetes mellitus affecting pregnancy in third trimester; Large for gestational age fetus affecting management of mother, antepartum; Indication for care in labor and delivery, antepartum; and Preeclampsia, third trimester on their problem list.  ----------------------------------------------------------------------------------- Patient reports her blurry vision has improved with the increased dose of Diamox.  She denies HA and RUQ pain.   Contractions: Irregular. Vag. Bleeding: None.  Movement: Present. Leaking Fluid denies.  GDMA2: About half her BG values normal.  She does get up in the middle of the night to drink milk due to heartburn.  So, her fasting values may not be truly fasting.   ----------------------------------------------------------------------------------- The following portions of the patient's history were reviewed and updated as appropriate: allergies, current medications, past family history, past medical history, past social history, past surgical history and problem list. Problem list updated.  Objective  Blood pressure (!) 144/86, weight 203 lb (92.1 kg), last menstrual period 04/23/2018, unknown if currently breastfeeding. Pregravid weight 157 lb (71.2 kg) Total Weight Gain 46 lb (20.9 kg) Urinalysis: Urine Protein    Urine Glucose    Fetal Status: Fetal Heart Rate (bpm): 140   Movement: Present     General:  Alert, oriented and cooperative. Patient is in no acute distress.  Skin: Skin is warm and dry. No rash noted.    Cardiovascular: Normal heart rate noted  Respiratory: Normal respiratory effort, no problems with respiration noted  Abdomen: Soft, gravid, appropriate for gestational age. Pain/Pressure: Present     Pelvic:  Cervical exam deferred        Extremities: Normal range of motion.     Mental Status: Normal mood and affect. Normal behavior. Normal judgment and thought content.   NST: Baseline FHR: 140 beats/min Variability: moderate Accelerations: present Decelerations: absent Tocometry: not done  Interpretation:  INDICATIONS: gestational diabetes mellitus and preeclampsia without severe features RESULTS:  A NST procedure was performed with FHR monitoring and a normal baseline established, appropriate time of 20-40 minutes of evaluation, and accels >2 seen w 15x15 characteristics.  Results show a REACTIVE NST.    Assessment   24 y.o. G3P0020 at [redacted]w[redacted]d by  03/16/2019, by Ultrasound presenting for routine prenatal visit  Plan   pregnancy3 Problems (from 04/23/18 to present)    Problem Noted Resolved   Preeclampsia, third trimester 02/07/2019 by Tresea Mall, CNM No   Large for gestational age fetus affecting management of mother, antepartum 01/24/2019 by Vena Austria, MD No   Overview Addendum 01/31/2019  8:44 PM by Natale Milch, MD    01/17/2019: EFW 90%: 2517 g (5 lbs 9 oz)      Diabetes mellitus affecting pregnancy in third trimester 01/20/2019 by Natale Milch, MD No   Overview Addendum 01/31/2019  8:42 PM by Natale Milch, MD    Diet controlled gestational diabetic      Supervision of high risk pregnancy, antepartum 08/05/2018 by Landry Dyke, PA-C No   Overview Addendum 01/31/2019  8:51 PM by Natale Milch, MD     Nursing Staff Provider  Office Location  [facility] Dating  [place] 8 wks  Language  Spanish Anatomy US  DP  MFM [place] 20 wks  Flu Vaccine  10/9 Genetic Screen  AFP only Neg  TDaP vaccine   01/17/2019 Hgb A1C or  GTT Third trimester: 3hr  GTT, all values elevated  Rhogam  n/a   LAB RESULTS   Feeding Plan  breast Blood Type O/Positive/-- (07/09 1639)   Contraception  Antibody Negative (07/09 1639)  Circumcision  Rubella 1.42 (07/09 1639)  Pediatrician   RPR Non Reactive (07/09 1639)   Support Person  HBsAg Negative (07/09 1639)   Prenatal Classes  HIV Non-reactive (07/09 0000)  BTL Consent No, n/a GBS  (For PCN allergy, check sensitivities)   VBAC Consent n/a Pap  07/2018 normal        Low-lying placenta 10/15/2018 by Caren Macadam, MD 02/03/2019 by Dellia Nims, MD   Overview Addendum 01/24/2019  2:13 PM by Malachy Mood, MD    <2cm from internal os at 18wk - resolved at 28 weeks          Preterm labor symptoms and general obstetric precautions including but not limited to vaginal bleeding, contractions, leaking of fluid and fetal movement were reviewed in detail with the patient. Please refer to After Visit Summary for other counseling recommendations.   - IOL 1/28 at 0500 - Covid testing in AM between 9-10AM - may cancel appt with [facility] on 1/28. - increase insulin to 28 units from 25 units - precautions for worsening preeclampsia symptoms given.    Hospital provided spanish language medical translator present throughout appointment.  Return for Keep induction of labor appointment for 1/28 at [place], MD, [place], [facility] 02/21/2019 9:45 AM

## 2019-02-22 ENCOUNTER — Other Ambulatory Visit
Admission: RE | Admit: 2019-02-22 | Discharge: 2019-02-22 | Disposition: A | Discharge status: Home / Self Care | Payer: BC Managed Care – PPO | Source: Ambulatory Visit | Attending: Obstetrics and Gynecology | Admitting: Obstetrics and Gynecology

## 2019-02-22 ENCOUNTER — Other Ambulatory Visit: Payer: Self-pay

## 2019-02-22 DIAGNOSIS — Z20822 Contact with and (suspected) exposure to covid-19: Secondary | ICD-10-CM | POA: Insufficient documentation

## 2019-02-22 DIAGNOSIS — Z01812 Encounter for preprocedural laboratory examination: Secondary | ICD-10-CM | POA: Insufficient documentation

## 2019-02-22 LAB — SARS CORONAVIRUS 2 (TAT 6-24 HRS): SARS Coronavirus 2: NEGATIVE

## 2019-02-24 ENCOUNTER — Inpatient Hospital Stay: Payer: BC Managed Care – PPO | Admitting: Anesthesiology

## 2019-02-24 ENCOUNTER — Other Ambulatory Visit: Payer: Self-pay

## 2019-02-24 ENCOUNTER — Encounter: Payer: Self-pay | Admitting: Obstetrics & Gynecology

## 2019-02-24 ENCOUNTER — Ambulatory Visit: Payer: BC Managed Care – PPO

## 2019-02-24 ENCOUNTER — Inpatient Hospital Stay
Admission: EM | Admit: 2019-02-24 | Discharge: 2019-02-27 | DRG: 787 | Disposition: A | Discharge status: Home / Self Care | Payer: BC Managed Care – PPO | Attending: Obstetrics & Gynecology | Admitting: Obstetrics & Gynecology

## 2019-02-24 ENCOUNTER — Inpatient Hospital Stay: Admission: RE | Admit: 2019-02-24 | Payer: BC Managed Care – PPO | Source: Ambulatory Visit

## 2019-02-24 DIAGNOSIS — E669 Obesity, unspecified: Secondary | ICD-10-CM | POA: Diagnosis present

## 2019-02-24 DIAGNOSIS — O9902 Anemia complicating childbirth: Secondary | ICD-10-CM | POA: Diagnosis present

## 2019-02-24 DIAGNOSIS — O99214 Obesity complicating childbirth: Secondary | ICD-10-CM | POA: Diagnosis present

## 2019-02-24 DIAGNOSIS — O339 Maternal care for disproportion, unspecified: Secondary | ICD-10-CM | POA: Diagnosis present

## 2019-02-24 DIAGNOSIS — O99354 Diseases of the nervous system complicating childbirth: Secondary | ICD-10-CM | POA: Diagnosis present

## 2019-02-24 DIAGNOSIS — G932 Benign intracranial hypertension: Secondary | ICD-10-CM | POA: Diagnosis present

## 2019-02-24 DIAGNOSIS — Z3A37 37 weeks gestation of pregnancy: Secondary | ICD-10-CM | POA: Diagnosis not present

## 2019-02-24 DIAGNOSIS — O3660X0 Maternal care for excessive fetal growth, unspecified trimester, not applicable or unspecified: Secondary | ICD-10-CM

## 2019-02-24 DIAGNOSIS — O1404 Mild to moderate pre-eclampsia, complicating childbirth: Secondary | ICD-10-CM | POA: Diagnosis present

## 2019-02-24 DIAGNOSIS — H4710 Unspecified papilledema: Secondary | ICD-10-CM | POA: Diagnosis present

## 2019-02-24 DIAGNOSIS — O24419 Gestational diabetes mellitus in pregnancy, unspecified control: Secondary | ICD-10-CM | POA: Diagnosis present

## 2019-02-24 DIAGNOSIS — O1042 Pre-existing secondary hypertension complicating childbirth: Secondary | ICD-10-CM | POA: Diagnosis not present

## 2019-02-24 DIAGNOSIS — Z87891 Personal history of nicotine dependence: Secondary | ICD-10-CM | POA: Diagnosis not present

## 2019-02-24 DIAGNOSIS — O24424 Gestational diabetes mellitus in childbirth, insulin controlled: Secondary | ICD-10-CM | POA: Diagnosis present

## 2019-02-24 DIAGNOSIS — O26893 Other specified pregnancy related conditions, third trimester: Secondary | ICD-10-CM | POA: Diagnosis present

## 2019-02-24 DIAGNOSIS — O24420 Gestational diabetes mellitus in childbirth, diet controlled: Secondary | ICD-10-CM | POA: Diagnosis not present

## 2019-02-24 DIAGNOSIS — D649 Anemia, unspecified: Secondary | ICD-10-CM | POA: Diagnosis present

## 2019-02-24 DIAGNOSIS — O0990 Supervision of high risk pregnancy, unspecified, unspecified trimester: Secondary | ICD-10-CM

## 2019-02-24 DIAGNOSIS — O99213 Obesity complicating pregnancy, third trimester: Secondary | ICD-10-CM | POA: Diagnosis present

## 2019-02-24 DIAGNOSIS — O24913 Unspecified diabetes mellitus in pregnancy, third trimester: Secondary | ICD-10-CM

## 2019-02-24 DIAGNOSIS — O1493 Unspecified pre-eclampsia, third trimester: Secondary | ICD-10-CM

## 2019-02-24 LAB — TYPE AND SCREEN
ABO/RH(D): O POS
Antibody Screen: NEGATIVE

## 2019-02-24 LAB — COMPREHENSIVE METABOLIC PANEL
ALT: 7 U/L (ref 0–44)
AST: 16 U/L (ref 15–41)
Albumin: 2.5 g/dL — ABNORMAL LOW (ref 3.5–5.0)
Alkaline Phosphatase: 307 U/L — ABNORMAL HIGH (ref 38–126)
Anion gap: 8 (ref 5–15)
BUN: 11 mg/dL (ref 6–20)
CO2: 20 mmol/L — ABNORMAL LOW (ref 22–32)
Calcium: 9 mg/dL (ref 8.9–10.3)
Chloride: 106 mmol/L (ref 98–111)
Creatinine, Ser: 0.46 mg/dL (ref 0.44–1.00)
GFR calc Af Amer: >60 mL/min (ref >60)
GFR calc non Af Amer: >60 mL/min (ref >60)
Glucose, Bld: 129 mg/dL — ABNORMAL HIGH (ref 70–99)
Potassium: 4.3 mmol/L (ref 3.5–5.1)
Sodium: 134 mmol/L — ABNORMAL LOW (ref 135–145)
Total Bilirubin: 0.4 mg/dL (ref 0.3–1.2)
Total Protein: 6.4 g/dL — ABNORMAL LOW (ref 6.5–8.1)

## 2019-02-24 LAB — CBC
HCT: 33 % — ABNORMAL LOW (ref 36.0–46.0)
Hemoglobin: 10.3 g/dL — ABNORMAL LOW (ref 12.0–15.0)
MCH: 25.6 pg — ABNORMAL LOW (ref 26.0–34.0)
MCHC: 31.2 g/dL (ref 30.0–36.0)
MCV: 82.1 fL (ref 80.0–100.0)
Platelets: 245 10*3/uL (ref 150–400)
RBC: 4.02 MIL/uL (ref 3.87–5.11)
RDW: 13.7 % (ref 11.5–15.5)
WBC: 9.6 10*3/uL (ref 4.0–10.5)
nRBC: 0 % (ref 0.0–0.2)

## 2019-02-24 LAB — GLUCOSE, CAPILLARY
Glucose-Capillary: 109 mg/dL — ABNORMAL HIGH (ref 70–99)
Glucose-Capillary: 99 mg/dL (ref 70–99)
Glucose-Capillary: 76 mg/dL (ref 70–99)
Glucose-Capillary: 76 mg/dL (ref 70–99)
Glucose-Capillary: 66 mg/dL — ABNORMAL LOW (ref 70–99)
Glucose-Capillary: 72 mg/dL (ref 70–99)
Glucose-Capillary: 53 mg/dL — ABNORMAL LOW (ref 70–99)
Glucose-Capillary: 87 mg/dL (ref 70–99)

## 2019-02-24 LAB — RPR: RPR Ser Ql: NONREACTIVE

## 2019-02-24 MED ORDER — OXYTOCIN 10 UNIT/ML IJ SOLN
INTRAMUSCULAR | Status: AC
  Filled 2019-02-24: qty 2

## 2019-02-24 MED ORDER — LIDOCAINE HCL (PF) 1 % IJ SOLN
INTRAMUSCULAR | Status: DC | PRN
  Administered 2019-02-24: 3 mL via SUBCUTANEOUS

## 2019-02-24 MED ORDER — EPHEDRINE 5 MG/ML INJ: 10.0000 mg | INTRAVENOUS | Status: DC | PRN

## 2019-02-24 MED ORDER — OXYTOCIN BOLUS FROM INFUSION: 500.0000 mL | Freq: Once | INTRAVENOUS | Status: DC

## 2019-02-24 MED ORDER — LIDOCAINE HCL (PF) 1 % IJ SOLN: 30.0000 mL | INTRAMUSCULAR | Status: DC | PRN

## 2019-02-24 MED ORDER — LACTATED RINGERS IV SOLN: INTRAVENOUS | Status: DC

## 2019-02-24 MED ORDER — MISOPROSTOL 25 MCG QUARTER TABLET
25.0000 ug | ORAL_TABLET | ORAL | Status: DC | PRN
  Administered 2019-02-24: 25 ug via VAGINAL
  Filled 2019-02-24: qty 1

## 2019-02-24 MED ORDER — ONDANSETRON HCL 4 MG/2ML IJ SOLN
4.0000 mg | Freq: Four times a day (QID) | INTRAMUSCULAR | Status: DC | PRN
  Administered 2019-02-24: 10:00:00 4 mg via INTRAVENOUS
  Filled 2019-02-24: qty 2

## 2019-02-24 MED ORDER — FENTANYL 2.5 MCG/ML W/ROPIVACAINE 0.15% IN NS 100 ML EPIDURAL (ARMC)
EPIDURAL | Status: AC
  Administered 2019-02-24: 22:00:00 250 ug
  Filled 2019-02-24: qty 100

## 2019-02-24 MED ORDER — LACTATED RINGERS IV SOLN
500.0000 mL | Freq: Once | INTRAVENOUS | Status: AC
  Administered 2019-02-24: 500 mL via INTRAVENOUS

## 2019-02-24 MED ORDER — ACETAMINOPHEN 325 MG PO TABS
650.0000 mg | ORAL_TABLET | ORAL | Status: DC | PRN
  Administered 2019-02-24: 650 mg via ORAL
  Filled 2019-02-24: qty 2

## 2019-02-24 MED ORDER — TERBUTALINE SULFATE 1 MG/ML IJ SOLN: 0.2500 mg | Freq: Once | INTRAMUSCULAR | Status: DC | PRN

## 2019-02-24 MED ORDER — DIPHENHYDRAMINE HCL 50 MG/ML IJ SOLN: 12.5000 mg | INTRAMUSCULAR | Status: DC | PRN

## 2019-02-24 MED ORDER — FENTANYL 2.5 MCG/ML W/ROPIVACAINE 0.15% IN NS 100 ML EPIDURAL (ARMC)
EPIDURAL | Status: AC
  Filled 2019-02-24: qty 100

## 2019-02-24 MED ORDER — OXYTOCIN 40 UNITS IN NORMAL SALINE INFUSION - SIMPLE MED
1.0000 m[IU]/min | INTRAVENOUS | Status: DC
  Administered 2019-02-24: 11:00:00 1 m[IU]/min via INTRAVENOUS

## 2019-02-24 MED ORDER — PHENYLEPHRINE 40 MCG/ML (10ML) SYRINGE FOR IV PUSH (FOR BLOOD PRESSURE SUPPORT): 80.0000 ug | PREFILLED_SYRINGE | INTRAVENOUS | Status: DC | PRN

## 2019-02-24 MED ORDER — FENTANYL 2.5 MCG/ML W/ROPIVACAINE 0.15% IN NS 100 ML EPIDURAL (ARMC)
12.0000 mL/h | EPIDURAL | Status: DC
  Administered 2019-02-24: 12 mL/h via EPIDURAL

## 2019-02-24 MED ORDER — LACTATED RINGERS IV SOLN: 500.0000 mL | INTRAVENOUS | Status: DC | PRN

## 2019-02-24 MED ORDER — AMMONIA AROMATIC IN INHA
RESPIRATORY_TRACT | Status: AC
  Filled 2019-02-24: qty 10

## 2019-02-24 MED ORDER — BUPIVACAINE HCL (PF) 0.25 % IJ SOLN
INTRAMUSCULAR | Status: DC | PRN
  Administered 2019-02-24 (×2): 4 mL via EPIDURAL

## 2019-02-24 MED ORDER — BUTORPHANOL TARTRATE 1 MG/ML IJ SOLN
1.0000 mg | INTRAMUSCULAR | Status: DC | PRN
  Administered 2019-02-24 (×2): 1 mg via INTRAVENOUS
  Filled 2019-02-24 (×2): qty 1

## 2019-02-24 MED ORDER — OXYTOCIN 40 UNITS IN NORMAL SALINE INFUSION - SIMPLE MED
INTRAVENOUS | Status: AC
  Filled 2019-02-24: qty 1000

## 2019-02-24 MED ORDER — OXYTOCIN 40 UNITS IN NORMAL SALINE INFUSION - SIMPLE MED: 2.5000 [IU]/h | INTRAVENOUS | Status: DC

## 2019-02-24 MED ORDER — LIDOCAINE HCL (PF) 1 % IJ SOLN
INTRAMUSCULAR | Status: AC
  Filled 2019-02-24: qty 30

## 2019-02-24 MED ORDER — LIDOCAINE-EPINEPHRINE (PF) 1.5 %-1:200000 IJ SOLN
INTRAMUSCULAR | Status: DC | PRN
  Administered 2019-02-24: 3 mL via EPIDURAL

## 2019-02-24 MED ORDER — MISOPROSTOL 200 MCG PO TABS
ORAL_TABLET | ORAL | Status: AC
  Filled 2019-02-24: qty 4

## 2019-02-24 NOTE — Progress Notes (Signed)
  Labor Progress Note   24 y.o. [id number] @ [redacted]w[redacted]d , admitted for  Pregnancy, Labor Management. IOL due to GDMA2, Preeclampsia (mild)  Subjective:  Pain w ctxs after one dose of Cytotec  Objective:  BP (!) 153/102   Pulse (!) 107   Temp 97.9 F (36.6 C) (Oral)   Resp 19   Ht 5\' 3"  (1.6 m)   Wt 91.6 kg   LMP 04/23/2018   BMI 35.78 kg/m   Trend BPs- Mostly 140s/80s Abd: gravid, ND, FHT present, moderate tenderness on exam Extr: trace to 1+ bilateral pedal edema SVE: CERVIX: 1 cm dilated, 70 effaced, -3 station, presenting part Vtx MEMBRANES: intact  EFM: FHR: 130 bpm, variability: moderate,  accelerations:  Present,  decelerations:  Absent Toco: Frequency: Every 3-5 minutes Labs: I have reviewed the patient's lab results. BS recent- 99  Assessment & Plan:  04/25/2018 @ [redacted]w[redacted]d, admitted for  Pregnancy and Labor/Delivery Management  1. Pain management: none. 2. FWB: FHT category 1.  3. ID: GBS negative 4. Labor management: Pitocin as ctxs slacken, no further Cytotec Epidural once she dilates more Stadol prn  All discussed with patient, see orders  [redacted]w[redacted]d, MD, Annamarie Major Ob/G[facility], [facility] 02/24/2019  10:05 AM

## 2019-02-24 NOTE — H&P (Signed)
OB History & Physical   History of Present Illness:  Chief Complaint:   HPI:  Audrey Munoz is a 24 y.o. [place] female with EDC=03/16/2019 at 69w1ddated by 8wk5d ultrasound.  Her pregnancy has been complicated by GDMA2 (currently on 28 U Lantus q PM), idiopathic intracranial hypertension (diagnosed during pregnancy when she presented with headaches, visual changes and papilledema/ was on Diamox), obesity,  and mild preeclampsia (diagnosed 02/07/2019 with mild range blood pressures and PC ratio 530 mgm) . She presents to L&D for induction of labor.  She reports having some continued problems with headaches and visual changes (blurred vision and scintillating scotomata). She has some blurred vision with the contractions this AM. BRandel Bookshas been active. Denies RUQ pain, CP, and nausea. Has been having some mild irregular contractions prior to admission. No vaginal bleeding or leakage of fluid. Last growth scan ion 02/03/2019: EFW : 5#8oz (58th%)   [facility]renatal care site: [facility]  Nursing Staff Provider  Office Location  ACHD Dating  [place]8 wks  Language  Spanish Anatomy [place] DP MFM [place]20 wks  Flu Vaccine  10/9 Genetic Screen  AFP only Neg  TDaP vaccine   01/17/2019 Hgb A1C or  GTT Early glucola 157 nwith normal 3 hour GTT. Third trimester: 3hr GTT, all values elevated  Rhogam  n/a   LAB RESULTS   Feeding Plan  breast Blood Type O/Positive/-- (07/09 1639)   Contraception  Antibody Negative (07/09 1639)  Circumcision  Rubella 1.42 (07/09 1639)  Pediatrician   RPR Non Reactive (07/09 1639)   Support Person  HBsAg Negative (07/09 1639)   Prenatal Classes  HIV Non-reactive (07/09 0000)  BTL Consent No, n/a GBS  negative   VBAC Consent n/a Pap  07/2018 normal       Maternal Medical History:   Past Medical History:  Diagnosis Date  . Gestational diabetes   . Hypertension    with pregnancy  . Idiopathic intracranial hypertension   . Seizures  [place]    Seizures as a child with ?med  . Syncope     Past Surgical History:  Procedure Laterality Date  . FOOT GANGLION EXCISION     Cyst removed from foot  . FOOT SURGERY      No Known Allergies  Prior to Admission medications   Medication Sig Start Date End Date Taking? Authorizing Provider  LANTUS SOLOSTAR 100 UNIT/ML Solostar Pen  02/03/19  Yes [provider]  Prenatal Vit-Fe Fumarate-FA (MULTIVITAMIN-PRENATAL) 27-0.8 MG TABS tablet Take 1 tablet by mouth daily at 12 noon.   Yes [provider]  Accu-Chek FastClix Lancets MISC 1 each by Does not apply route 4 (four) times daily. 01/20/19   Schuman, CStefanie Libel MD  Blood Glucose Monitoring Suppl (ACCU-CHEK GUIDE) w/Device KIT 1 each by Does not apply route 4 (four) times daily. 01/20/19   Schuman, CRenold DonR, MD  glucose blood (ACCU-CHEK GUIDE) test strip Use as instructed 01/20/19   SHomero Fellers MD          Social History: She  reports that she quit smoking about 7 months ago. Her smoking use included cigarettes. She smoked 0.50 packs per day. She has never used smokeless tobacco. She reports previous alcohol use. She reports that she does not use drugs.  Family History: family history includes Asthma in her father; Cancer in her maternal grandmother; Cancer - Ovarian in her maternal grandmother; Diabetes in her mother;  Ovarian cancer in her maternal grandmother; Seizures in her sister.   Review of Systems: Negative x 10 systems reviewed except as noted in the HPI.      Physical Exam:  Vital Signs: BP (!) 146/82   Pulse 96   Temp 97.9 F (36.6 C) (Oral)   Resp 19   Ht _0  (1.6 m)   Wt 91.6 kg   LMP 04/23/2018   BMI 35.78 kg/m   Patient Vitals for the past 24 hrs:  BP Temp Temp src Pulse Resp Height Weight  02/24/19 0515 (!) 146/82 -- -- 96 -- -- --  02/24/19 0446 (!) 140/92 97.9 F (36.6 C) Oral (!) 115 19 _1  (1.6 m) 91.6 kg   General: no acute distress.  HEENT:  normocephalic, atraumatic Heart: regular rate & rhythm.  No murmurs/rubs/gallops Lungs: clear to auscultation bilaterally Abdomen: soft, gravid, non-tender;  EFW: 7# Pelvic:   External: Normal external female genitalia  Cervix: Dilation: Fingertip / Effacement (%): Thick / Station: -3 per RN exam   Extremities: non-tender, symmetric, trace edema bilaterally.  DTRs: +1  Neurologic: Alert & oriented x 3.   Baseline FHR: 135 baseline with accelerations to 150s to 160s, moderate variability Toco: occasional contractions on admission Bedside ultrasound: cephalic presentation (LOT)  Results for orders placed or performed during the hospital encounter of 02/24/19 (from the past 24 hour(s))  CBC     Status: Abnormal   Collection Time: 02/24/19  4:54 AM  Result Value Ref Range   WBC 9.6 4.0 - 10.5 K/uL   RBC 4.02 3.87 - 5.11 MIL/uL   Hemoglobin 10.3 (L) 12.0 - 15.0 g/dL   HCT 33.0 (L) 36.0 - 46.0 %   MCV 82.1 80.0 - 100.0 fL   MCH 25.6 (L) 26.0 - 34.0 pg   MCHC 31.2 30.0 - 36.0 g/dL   RDW 13.7 11.5 - 15.5 %   Platelets 245 150 - 400 K/uL   nRBC 0.0 0.0 - 0.2 %  Type and screen     Status: None   Collection Time: 02/24/19  4:54 AM  Result Value Ref Range   ABO/RH(D) O POS    Antibody Screen NEG    Sample Expiration      02/27/2019,2359 Performed at [facility], [address] [place], [place] [address]   Comprehensive metabolic panel     Status: Abnormal   Collection Time: 02/24/19  4:54 AM  Result Value Ref Range   Sodium 134 (L) 135 - 145 mmol/L   Potassium 4.3 3.5 - 5.1 mmol/L   Chloride 106 98 - 111 mmol/L   CO2 20 (L) 22 - 32 mmol/L   Glucose, Bld 129 (H) 70 - 99 mg/dL   BUN 11 6 - 20 mg/dL   Creatinine, Ser 0.46 0.44 - 1.00 mg/dL   Calcium 9.0 8.9 - 10.3 mg/dL   Total Protein 6.4 (L) 6.5 - 8.1 g/dL   Albumin 2.5 (L) 3.5 - 5.0 g/dL   AST 16 15 - 41 U/L   ALT 7 0 - 44 U/L   Alkaline Phosphatase 307 (H) 38 - 126 U/L   Total Bilirubin 0.4 0.3 - 1.2 mg/dL    GFR calc non Af Amer >60 >60 mL/min   GFR calc Af Amer >60 >60 mL/min   Anion gap 8 5 - 15  Glucose, capillary     Status: Abnormal   Collection Time: 02/24/19  5:47 AM  Result Value Ref Range   Glucose-Capillary 109 (H)  70 - 99 mg/dL    Assessment:  Audrey Munoz is a 24 y.o. G61P0020 female at 19w1dwith GDMA2 on insulin Idiopathic intracranial hypertension Preeclampsia without severe features  FWB: Cat 1 tracing  Plan: Plan induction with Cytotec. Received first dose of Cytotec PV at 0545.  Continuous fetal monitoring CBGS: 2hr pp this AM was 109. Will do CBGs 2hr pp in early labor. Will be on clear liquids  As becomes more active, will check CBGs more frequently and add endotool as  needed.  GBS negative Preeclampsia without severe features: Does have headaches and blurred vision with intracranial hypertension complicating diagnosing severe preeclampsia based on these symptoms.  Mild blood pressure elevations this AM with normal HELLP labs  Will monitor for severe range blood pressures and treat accordingly. O POS/ RI/ VI Breast Condoms TDAP given AP   CDalia Heading 02/24/2019 6:43 AM

## 2019-02-24 NOTE — Anesthesia Procedure Notes (Signed)
Epidural  Start time: 02/24/2019 4:02 PM End time: 02/24/2019 4:19 PM  Staffing Anesthesiologist: Piscitello, Cleda Mccreedy, MD Resident/CRNA: Irving Burton, CRNA Performed: resident/CRNA   Preanesthetic Checklist Completed: patient identified, IV checked, site marked, risks and benefits discussed, surgical consent, monitors and equipment checked and pre-op evaluation  Epidural Patient position: sitting Prep: ChloraPrep Patient monitoring: heart rate, continuous pulse ox and blood pressure Approach: midline Location: L3-L4 Injection technique: LOR air  Needle:  Needle type: Tuohy  Needle gauge: 17 G Needle length: 9 cm Needle insertion depth: 7 cm Catheter type: closed end flexible Catheter size: 19 Gauge Catheter at skin depth: 12 cm Test dose: 1.5% lidocaine with Epi 1:200 K  Assessment Events: blood not aspirated, injection not painful, no injection resistance, no paresthesia and negative IV test  Additional Notes Reason for block:procedure for pain

## 2019-02-24 NOTE — Progress Notes (Signed)
  Labor Progress Note   24 y.o. [id number] @ 103w1d , admitted for  Pregnancy, Labor Management.   Subjective:  BS normal (70-90) Pain still w all ctxs  Objective:  BP 139/90   Pulse 87   Temp 97.9 F (36.6 C) (Oral)   Resp 19   Ht 5\' 3"  (1.6 m)   Wt 91.6 kg   LMP 04/23/2018   BMI 35.78 kg/m  Abd: gravid, ND, FHT present, moderate tenderness on exam Extr: trace to 1+ bilateral pedal edema SVE: CERVIX: 3 cm dilated, 80 effaced, -3 station  EFM: FHR:  140 bpm, variability: moderate,  accelerations:  Present,  decelerations:  Absent Toco: Frequency: Every 2-3 minutes Labs: I have reviewed the patient's lab results.   Assessment & Plan:  04/25/2018 @ [redacted]w[redacted]d, admitted for  Pregnancy and Labor/Delivery Management  1. Pain management: IV sedation. 2. FWB: FHT category 1.  3. ID: GBS negative 4. Labor management: Epidural now Cont Pitocin (on 6 mU/min) Cont BS monitoring  All discussed with patient, see orders  [redacted]w[redacted]d, MD, Annamarie Major Ob/Gyn, [facility] 02/24/2019  3:09 PM

## 2019-02-24 NOTE — Anesthesia Preprocedure Evaluation (Addendum)
Anesthesia Evaluation  Patient identified by MRN, date of birth, ID band Patient awake    Reviewed: Allergy & Precautions, H&P , NPO status , Patient's Chart, lab work & pertinent test results  History of Anesthesia Complications Negative for: history of anesthetic complications  Airway Mallampati: III  TM Distance: >3 FB Neck ROM: full    Dental  (+) Chipped   Pulmonary neg pulmonary ROS, former smoker,           Cardiovascular Exercise Tolerance: Good hypertension, negative cardio ROS       Neuro/Psych Seizures -,     GI/Hepatic negative GI ROS,   Endo/Other  diabetes  Renal/GU   negative genitourinary   Musculoskeletal   Abdominal   Peds  Hematology negative hematology ROS (+)   Anesthesia Other Findings Past Medical History: No date: Gestational diabetes No date: Hypertension     Comment:  with pregnancy No date: Idiopathic intracranial hypertension No date: Seizures (HCC)     Comment:  Seizures as a child with ?med No date: Syncope  Past Surgical History: No date: FOOT GANGLION EXCISION     Comment:  Cyst removed from foot No date: FOOT SURGERY  BMI    Body Mass Index: 35.78 kg/m      Reproductive/Obstetrics (+) Pregnancy                            Anesthesia Physical Anesthesia Plan  ASA: III  Anesthesia Plan: Epidural   Post-op Pain Management:    Induction:   PONV Risk Score and Plan:   Airway Management Planned:   Additional Equipment:   Intra-op Plan:   Post-operative Plan:   Informed Consent: I have reviewed the patients History and Physical, chart, labs and discussed the procedure including the risks, benefits and alternatives for the proposed anesthesia with the patient or authorized representative who has indicated his/her understanding and acceptance.       Plan Discussed with: Anesthesiologist  Anesthesia Plan Comments: (Consent via  interpreter )       Anesthesia Quick Evaluation

## 2019-02-25 ENCOUNTER — Encounter
Admission: EM | Disposition: A | Discharge status: Home / Self Care | Payer: Self-pay | Source: Home / Self Care | Attending: Obstetrics & Gynecology

## 2019-02-25 ENCOUNTER — Encounter: Payer: Self-pay | Admitting: Obstetrics & Gynecology

## 2019-02-25 DIAGNOSIS — O1404 Mild to moderate pre-eclampsia, complicating childbirth: Secondary | ICD-10-CM

## 2019-02-25 DIAGNOSIS — O1042 Pre-existing secondary hypertension complicating childbirth: Secondary | ICD-10-CM

## 2019-02-25 DIAGNOSIS — O24420 Gestational diabetes mellitus in childbirth, diet controlled: Secondary | ICD-10-CM

## 2019-02-25 LAB — GLUCOSE, CAPILLARY
Glucose-Capillary: 106 mg/dL — ABNORMAL HIGH (ref 70–99)
Glucose-Capillary: 106 mg/dL — ABNORMAL HIGH (ref 70–99)
Glucose-Capillary: 92 mg/dL (ref 70–99)
Glucose-Capillary: 92 mg/dL (ref 70–99)

## 2019-02-25 SURGERY — Surgical Case
Anesthesia: Epidural

## 2019-02-25 MED ORDER — LACTATED RINGERS IV SOLN: Freq: Once | INTRAVENOUS | Status: DC

## 2019-02-25 MED ORDER — OXYCODONE-ACETAMINOPHEN 5-325 MG PO TABS
1.0000 | ORAL_TABLET | ORAL | Status: DC | PRN
  Administered 2019-02-26 (×2): 2 via ORAL
  Administered 2019-02-26 – 2019-02-27 (×2): 1 via ORAL
  Filled 2019-02-25: qty 2
  Filled 2019-02-25 (×2): qty 1
  Filled 2019-02-25: qty 2

## 2019-02-25 MED ORDER — MIDAZOLAM HCL 2 MG/2ML IJ SOLN
INTRAMUSCULAR | Status: AC
  Filled 2019-02-25: qty 2

## 2019-02-25 MED ORDER — KETAMINE HCL 50 MG/ML IJ SOLN
INTRAMUSCULAR | Status: AC
  Filled 2019-02-25: qty 10

## 2019-02-25 MED ORDER — ACETAMINOPHEN 325 MG PO TABS: 650.0000 mg | ORAL_TABLET | Freq: Four times a day (QID) | ORAL | Status: DC

## 2019-02-25 MED ORDER — OXYTOCIN 40 UNITS IN NORMAL SALINE INFUSION - SIMPLE MED
2.5000 [IU]/h | INTRAVENOUS | Status: DC
  Administered 2019-02-25 (×2): 2.5 [IU]/h via INTRAVENOUS
  Filled 2019-02-25: qty 1000

## 2019-02-25 MED ORDER — FENTANYL CITRATE (PF) 100 MCG/2ML IJ SOLN
INTRAMUSCULAR | Status: DC | PRN
  Administered 2019-02-25: 100 ug via INTRAVENOUS

## 2019-02-25 MED ORDER — SOD CITRATE-CITRIC ACID 500-334 MG/5ML PO SOLN: 30.0000 mL | ORAL | Status: AC

## 2019-02-25 MED ORDER — LACTATED RINGERS IV SOLN: INTRAVENOUS | Status: DC

## 2019-02-25 MED ORDER — NALOXONE HCL 0.4 MG/ML IJ SOLN: 0.4000 mg | INTRAMUSCULAR | Status: DC | PRN

## 2019-02-25 MED ORDER — IBUPROFEN 800 MG PO TABS: 800.0000 mg | ORAL_TABLET | Freq: Four times a day (QID) | ORAL | Status: DC

## 2019-02-25 MED ORDER — LIDOCAINE HCL (PF) 2 % IJ SOLN
INTRAMUSCULAR | Status: AC
  Filled 2019-02-25: qty 40

## 2019-02-25 MED ORDER — FENTANYL CITRATE (PF) 100 MCG/2ML IJ SOLN
INTRAMUSCULAR | Status: AC
  Filled 2019-02-25: qty 2

## 2019-02-25 MED ORDER — OXYTOCIN 40 UNITS IN NORMAL SALINE INFUSION - SIMPLE MED
INTRAVENOUS | Status: DC | PRN
  Administered 2019-02-25: 500 mL via INTRAVENOUS

## 2019-02-25 MED ORDER — NALBUPHINE HCL 10 MG/ML IJ SOLN: 5.0000 mg | Freq: Once | INTRAMUSCULAR | Status: DC | PRN

## 2019-02-25 MED ORDER — KETOROLAC TROMETHAMINE 30 MG/ML IJ SOLN
30.0000 mg | Freq: Four times a day (QID) | INTRAMUSCULAR | Status: AC
  Administered 2019-02-25 – 2019-02-26 (×4): 30 mg via INTRAVENOUS
  Filled 2019-02-25 (×4): qty 1

## 2019-02-25 MED ORDER — NALBUPHINE HCL 10 MG/ML IJ SOLN: 5.0000 mg | INTRAMUSCULAR | Status: DC | PRN

## 2019-02-25 MED ORDER — LACTATED RINGERS IV BOLUS
500.0000 mL | Freq: Once | INTRAVENOUS | Status: AC
  Administered 2019-02-25: 18:00:00 500 mL via INTRAVENOUS

## 2019-02-25 MED ORDER — ZOLPIDEM TARTRATE 5 MG PO TABS: 5.0000 mg | ORAL_TABLET | Freq: Every evening | ORAL | Status: DC | PRN

## 2019-02-25 MED ORDER — ONDANSETRON HCL 4 MG/2ML IJ SOLN
4.0000 mg | Freq: Three times a day (TID) | INTRAMUSCULAR | Status: DC | PRN
  Administered 2019-02-27: 01:00:00 4 mg via INTRAVENOUS
  Filled 2019-02-25: qty 2

## 2019-02-25 MED ORDER — BUPIVACAINE HCL (PF) 0.5 % IJ SOLN
INTRAMUSCULAR | Status: AC
  Filled 2019-02-25: qty 30

## 2019-02-25 MED ORDER — MIDAZOLAM HCL 2 MG/2ML IJ SOLN
INTRAMUSCULAR | Status: DC | PRN
  Administered 2019-02-25: 2 mg via INTRAVENOUS

## 2019-02-25 MED ORDER — SODIUM CHLORIDE 0.9 % IV SOLN
2.0000 g | INTRAVENOUS | Status: AC
  Administered 2019-02-25: 03:00:00 2 g via INTRAVENOUS
  Filled 2019-02-25: qty 2

## 2019-02-25 MED ORDER — SODIUM CHLORIDE 0.9% FLUSH: 3.0000 mL | INTRAVENOUS | Status: DC | PRN

## 2019-02-25 MED ORDER — SIMETHICONE 80 MG PO CHEW: 80.0000 mg | CHEWABLE_TABLET | ORAL | Status: DC | PRN

## 2019-02-25 MED ORDER — BUPIVACAINE ON-Q PAIN PUMP (FOR ORDER SET NO CHG): INJECTION | Status: DC

## 2019-02-25 MED ORDER — WITCH HAZEL-GLYCERIN EX PADS: 1.0000 "application " | MEDICATED_PAD | CUTANEOUS | Status: DC | PRN

## 2019-02-25 MED ORDER — SENNOSIDES-DOCUSATE SODIUM 8.6-50 MG PO TABS
2.0000 | ORAL_TABLET | ORAL | Status: DC
  Administered 2019-02-26 – 2019-02-27 (×2): 2 via ORAL
  Filled 2019-02-25 (×2): qty 2

## 2019-02-25 MED ORDER — MENTHOL 3 MG MT LOZG
1.0000 | LOZENGE | OROMUCOSAL | Status: DC | PRN
  Filled 2019-02-25: qty 9

## 2019-02-25 MED ORDER — MORPHINE SULFATE (PF) 0.5 MG/ML IJ SOLN
INTRAMUSCULAR | Status: AC
  Filled 2019-02-25: qty 10

## 2019-02-25 MED ORDER — DIPHENHYDRAMINE HCL 50 MG/ML IJ SOLN: 12.5000 mg | INTRAMUSCULAR | Status: DC | PRN

## 2019-02-25 MED ORDER — COCONUT OIL OIL
1.0000 "application " | TOPICAL_OIL | Status: DC | PRN
  Filled 2019-02-25: qty 120

## 2019-02-25 MED ORDER — SIMETHICONE 80 MG PO CHEW
80.0000 mg | CHEWABLE_TABLET | ORAL | Status: DC
  Administered 2019-02-26 – 2019-02-27 (×2): 80 mg via ORAL
  Filled 2019-02-25 (×2): qty 1

## 2019-02-25 MED ORDER — OXYTOCIN 40 UNITS IN NORMAL SALINE INFUSION - SIMPLE MED
INTRAVENOUS | Status: AC
  Filled 2019-02-25: qty 1000

## 2019-02-25 MED ORDER — HYDRALAZINE HCL 20 MG/ML IJ SOLN: 5.0000 mg | Freq: Once | INTRAMUSCULAR | Status: AC

## 2019-02-25 MED ORDER — LABETALOL HCL 5 MG/ML IV SOLN
INTRAVENOUS | Status: AC
  Filled 2019-02-25: qty 4

## 2019-02-25 MED ORDER — PRENATAL MULTIVITAMIN CH
1.0000 | ORAL_TABLET | Freq: Every day | ORAL | Status: DC
  Administered 2019-02-25 – 2019-02-26 (×2): 1 via ORAL
  Filled 2019-02-25 (×2): qty 1

## 2019-02-25 MED ORDER — OXYCODONE HCL 5 MG PO TABS
5.0000 mg | ORAL_TABLET | Freq: Four times a day (QID) | ORAL | Status: DC | PRN
  Administered 2019-02-25 – 2019-02-26 (×3): 5 mg via ORAL
  Filled 2019-02-25 (×3): qty 1

## 2019-02-25 MED ORDER — LABETALOL HCL 5 MG/ML IV SOLN
10.0000 mg | Freq: Once | INTRAVENOUS | Status: AC
  Administered 2019-02-25: 06:00:00 10 mg via INTRAVENOUS

## 2019-02-25 MED ORDER — KETAMINE HCL 50 MG/ML IJ SOLN
INTRAMUSCULAR | Status: DC | PRN
  Administered 2019-02-25: 30 mg via INTRAMUSCULAR
  Administered 2019-02-25: 20 mg via INTRAMUSCULAR

## 2019-02-25 MED ORDER — OXYCODONE HCL 5 MG PO TABS
5.0000 mg | ORAL_TABLET | Freq: Once | ORAL | Status: AC | PRN
  Administered 2019-02-25: 5 mg via ORAL
  Filled 2019-02-25: qty 1

## 2019-02-25 MED ORDER — NALOXONE HCL 4 MG/10ML IJ SOLN
1.0000 ug/kg/h | INTRAVENOUS | Status: DC | PRN
  Filled 2019-02-25: qty 5

## 2019-02-25 MED ORDER — SIMETHICONE 80 MG PO CHEW
80.0000 mg | CHEWABLE_TABLET | Freq: Three times a day (TID) | ORAL | Status: DC
  Administered 2019-02-25 – 2019-02-27 (×7): 80 mg via ORAL
  Filled 2019-02-25 (×8): qty 1

## 2019-02-25 MED ORDER — HYDRALAZINE HCL 20 MG/ML IJ SOLN
INTRAMUSCULAR | Status: AC
  Administered 2019-02-25: 5 mg via INTRAVENOUS
  Filled 2019-02-25: qty 1

## 2019-02-25 MED ORDER — BUPIVACAINE HCL 0.5 % IJ SOLN
10.0000 mL | Freq: Once | INTRAMUSCULAR | Status: DC
  Filled 2019-02-25: qty 10

## 2019-02-25 MED ORDER — LIDOCAINE HCL (PF) 2 % IJ SOLN
INTRAMUSCULAR | Status: DC | PRN
  Administered 2019-02-25 (×4): 100 mg via EPIDURAL

## 2019-02-25 MED ORDER — MORPHINE SULFATE (PF) 0.5 MG/ML IJ SOLN
INTRAMUSCULAR | Status: DC | PRN
  Administered 2019-02-25: 3 mg via EPIDURAL

## 2019-02-25 MED ORDER — DIBUCAINE (PERIANAL) 1 % EX OINT: 1.0000 "application " | TOPICAL_OINTMENT | CUTANEOUS | Status: DC | PRN

## 2019-02-25 MED ORDER — KETOROLAC TROMETHAMINE 30 MG/ML IJ SOLN: 30.0000 mg | Freq: Four times a day (QID) | INTRAMUSCULAR | Status: AC

## 2019-02-25 MED ORDER — ONDANSETRON HCL 4 MG/2ML IJ SOLN
INTRAMUSCULAR | Status: DC | PRN
  Administered 2019-02-25: 4 mg via INTRAVENOUS

## 2019-02-25 MED ORDER — BUPIVACAINE HCL 0.25 % IJ SOLN
INTRAMUSCULAR | Status: DC | PRN
  Administered 2019-02-25: 30 mL

## 2019-02-25 MED ORDER — IBUPROFEN 800 MG PO TABS
800.0000 mg | ORAL_TABLET | Freq: Four times a day (QID) | ORAL | Status: DC
  Administered 2019-02-26 – 2019-02-27 (×4): 800 mg via ORAL
  Filled 2019-02-25 (×4): qty 1

## 2019-02-25 MED ORDER — OXYCODONE HCL 5 MG/5ML PO SOLN: 5.0000 mg | Freq: Once | ORAL | Status: AC | PRN

## 2019-02-25 MED ORDER — SODIUM CHLORIDE 0.9 % IV SOLN
INTRAVENOUS | Status: AC
  Filled 2019-02-25: qty 2

## 2019-02-25 MED ORDER — ACETAMINOPHEN 325 MG PO TABS
ORAL_TABLET | ORAL | Status: AC
  Administered 2019-02-25: 650 mg via ORAL
  Filled 2019-02-25: qty 2

## 2019-02-25 MED ORDER — ACETAMINOPHEN 325 MG PO TABS
650.0000 mg | ORAL_TABLET | ORAL | Status: DC | PRN
  Filled 2019-02-25: qty 2

## 2019-02-25 MED ORDER — ACETAMINOPHEN 325 MG PO TABS
650.0000 mg | ORAL_TABLET | Freq: Four times a day (QID) | ORAL | Status: AC
  Administered 2019-02-25 – 2019-02-26 (×3): 650 mg via ORAL
  Filled 2019-02-25 (×2): qty 2

## 2019-02-25 MED ORDER — SOD CITRATE-CITRIC ACID 500-334 MG/5ML PO SOLN
ORAL | Status: AC
  Administered 2019-02-25: 02:00:00 30 mL via ORAL
  Filled 2019-02-25: qty 15

## 2019-02-25 MED ORDER — DIPHENHYDRAMINE HCL 25 MG PO CAPS: 25.0000 mg | ORAL_CAPSULE | Freq: Four times a day (QID) | ORAL | Status: DC | PRN

## 2019-02-25 MED ORDER — BUPIVACAINE 0.25 % ON-Q PUMP DUAL CATH 400 ML
400.0000 mL | INJECTION | Status: DC
  Filled 2019-02-25: qty 400

## 2019-02-25 MED ORDER — DIPHENHYDRAMINE HCL 25 MG PO CAPS: 25.0000 mg | ORAL_CAPSULE | ORAL | Status: DC | PRN

## 2019-02-25 MED ORDER — MORPHINE SULFATE (PF) 2 MG/ML IV SOLN: 1.0000 mg | INTRAVENOUS | Status: DC | PRN

## 2019-02-25 SURGICAL SUPPLY — 29 items
CANISTER SUCT 3000ML PPV (MISCELLANEOUS) ×3 IMPLANT
CATH KIT ON-Q SILVERSOAK 5IN (CATHETERS) ×6 IMPLANT
CHLORAPREP W/TINT 26 (MISCELLANEOUS) ×6 IMPLANT
COVER WAND RF STERILE (DRAPES) ×3 IMPLANT
DERMABOND ADVANCED (GAUZE/BANDAGES/DRESSINGS) ×2
DERMABOND ADVANCED .7 DNX12 (GAUZE/BANDAGES/DRESSINGS) ×1 IMPLANT
DRSG TEGADERM 4X4.75 (GAUZE/BANDAGES/DRESSINGS) ×9 IMPLANT
ELECT CAUTERY BLADE 6.4 (BLADE) ×3 IMPLANT
ELECT REM PT RETURN 9FT ADLT (ELECTROSURGICAL) ×3
ELECTRODE REM PT RTRN 9FT ADLT (ELECTROSURGICAL) ×1 IMPLANT
GLOVE SKINSENSE NS SZ8.0 LF (GLOVE) ×2
GLOVE SKINSENSE STRL SZ8.0 LF (GLOVE) ×1 IMPLANT
GOWN STRL REUS W/ TWL LRG LVL3 (GOWN DISPOSABLE) ×1 IMPLANT
GOWN STRL REUS W/ TWL XL LVL3 (GOWN DISPOSABLE) ×2 IMPLANT
GOWN STRL REUS W/TWL LRG LVL3 (GOWN DISPOSABLE) ×2
GOWN STRL REUS W/TWL XL LVL3 (GOWN DISPOSABLE) ×4
KIT PREVENA INCISION MGT 13 (CANNISTER) ×3 IMPLANT
NS IRRIG 1000ML POUR BTL (IV SOLUTION) ×3 IMPLANT
PACK C SECTION AR (MISCELLANEOUS) ×3 IMPLANT
PAD OB MATERNITY 4.3X12.25 (PERSONAL CARE ITEMS) ×3 IMPLANT
PAD PREP 24X41 OB/GYN DISP (PERSONAL CARE ITEMS) ×3 IMPLANT
PENCIL SMOKE ULTRAEVAC 22 CON (MISCELLANEOUS) ×3 IMPLANT
RETRACTOR TRAXI PANNICULUS (MISCELLANEOUS) ×1 IMPLANT
SUT MAXON ABS #0 GS21 30IN (SUTURE) ×6 IMPLANT
SUT PLAIN 2 0 XLH (SUTURE) ×3 IMPLANT
SUT VIC AB 1 CT1 36 (SUTURE) ×9 IMPLANT
SUT VIC AB 2-0 CT1 36 (SUTURE) ×3 IMPLANT
SUT VIC AB 4-0 FS2 27 (SUTURE) ×3 IMPLANT
TRAXI PANNICULUS RETRACTOR (MISCELLANEOUS) ×2

## 2019-02-25 NOTE — Discharge Summary (Signed)
OB Discharge Summary     Patient Name: Audrey Munoz DOB: January 17, 1996 MRN: [id number]  Date of admission: 02/24/2019 Delivering MD: Hoyt Koch, MD  Date of Delivery: 02/25/2019  Date of discharge: 02/27/2019  Admitting diagnosis: Gestational diabetes [O24.419] Intrauterine pregnancy: [redacted]w[redacted]d    Secondary diagnosis: Gestational Diabetes medication controlled (A2)     Discharge diagnosis: Term Pregnancy Delivered, Preeclampsia (mild) and GDM A2, Failed induction of labor and Reasons for cesarean section  [facility]  Induction of Labor With Cesarean Section  24y.o. yo G3P0020 at 370w2das admitted to the hospital 02/24/2019 for induction of labor. Patient had a labor course significant for Cytotec, Pitocin, Epidural, AROM, and made it to second stage yet then had arrest of descent. The patient went for cesarean section due to Arrest of Descent, and delivered a Viable infant,02/24/2019  Membrane Rupture Time/Date: 4:18 PM ,02/24/2019   Details of operation can be found in separate operative Note.  Patient had an uncomplicated postpartum course. She is ambulating, tolerating a regular diet, passing flatus, and urinating well.  Patient is discharged home in stable condition on 02/27/19.                                                                                                   Post partum procedures:none  Complications: None  Physical exam on 02/27/2019: Vitals:   02/26/19 2001 02/26/19 2345 02/27/19 0355 02/27/19 0908  BP: 135/87 133/90 123/76 139/90  Pulse: (!) 108 95 88 93  Resp: '18 18  20  '$ Temp: 98.5 F (36.9 C) 98.3 F (36.8 C) 98.4 F (36.9 C) 98.7 F (37.1 C)  TempSrc: Axillary Oral Oral   SpO2: 99% 100% 100% 99%  Weight:      Height:       General: alert, cooperative and no distress Lochia: appropriate Uterine Fundus: firm Incision: Healing well with no significant drainage, No significant erythema, Dressing is  clean, dry, and intact DVT Evaluation: No evidence of DVT seen on physical exam. Negative Homan's sign. No cords or calf tenderness. No significant calf/ankle edema.  Labs: Lab Results  Component Value Date   WBC 13.2 (H) 02/26/2019   HGB 6.9 (L) 02/26/2019   HCT 22.6 (L) 02/26/2019   MCV 82.8 02/26/2019   PLT 184 02/26/2019   CMP Latest Ref Rng & Units 02/26/2019  Glucose 70 - 99 mg/dL -  BUN 6 - 20 mg/dL -  Creatinine 0.44 - 1.00 mg/dL 0.86  Sodium 135 - 145 mmol/L -  Potassium 3.5 - 5.1 mmol/L -  Chloride 98 - 111 mmol/L -  CO2 22 - 32 mmol/L -  Calcium 8.9 - 10.3 mg/dL -  Total Protein 6.5 - 8.1 g/dL -  Total Bilirubin 0.3 - 1.2 mg/dL -  Alkaline Phos 38 - 126 U/L -  AST 15 - 41 U/L -  ALT 0 - 44 U/L -    Discharge instruction: per After  Visit Summary.  Medications:  Allergies as of 02/27/2019   No Known Allergies     Medication List    STOP taking these medications   Accu-Chek FastClix Lancets Misc   Accu-Chek Guide test strip Generic drug: glucose blood   Accu-Chek Guide w/Device Kit   Lantus SoloStar 100 UNIT/ML Solostar Pen Generic drug: Insulin Glargine     TAKE these medications   ferrous sulfate 325 (65 FE) MG tablet Take 1 tablet (325 mg total) by mouth 2 (two) times daily with a meal.   multivitamin-prenatal 27-0.8 MG Tabs tablet Take 1 tablet by mouth daily at 12 noon.   oxyCODONE-acetaminophen 5-325 MG tablet Commonly known as: PERCOCET/ROXICET Take 1-2 tablets by mouth every 4 (four) hours as needed for moderate pain.     MOTRIN for pain COLACE for stool softener  Diet: routine diet  Activity: Advance as tolerated. Pelvic rest for 6 weeks.   Outpatient follow up: Follow-up Information    Gae Dry, MD. Schedule an appointment as soon as possible for a visit on 03/04/2019.   Specialty: Obstetrics and Gynecology Why: Follow up on 2/5 @ 9:00 am with Dr. Glennon Mac for incision and blood pressure check. Contact information: [place] [address] [phone]             Postpartum contraception: Progesterone only pills, alternatives discussed, she prefers none at this time Rhogam Given postpartum: no Rubella vaccine given postpartum: no Varicella vaccine given postpartum: no TDaP given antepartum or postpartum: Yes  Newborn Data: Live born female  Birth Weight: 7 lb 5.1 oz (3320 g) APGAR: 9, 9  Newborn Delivery   Birth date/time: 02/24/2019 03:03:00 Delivery type: C-Section, Low Transverse Trial of labor: Yes C-section categorization: Primary       Baby Feeding: Bottle and Breast  Disposition:home with mother  SIGNED: Hoyt Koch, MD 02/27/2019 11:48 AM

## 2019-02-25 NOTE — Progress Notes (Signed)
Day of Surgery. LTCS for failure to descend. Pregnancy complicated by GDMA2 (on insulin) and preeclampsia without severe features, and idiopathic intracranial hypertension Subjective:   Via interpretor: tired and thirsty. Incisional pain mild and mostly on the right side. No nausea. No headache. Tolerating sips of water.  Also complains of edema in hands and paresthesia in fingers.   Objective:  Blood pressure 122/74, pulse 99, temperature 98.2 F (36.8 C), temperature source Oral, resp. rate 20, height 5\' 3"  (1.6 m), weight 91.6 kg, last menstrual period 04/23/2018, SpO2 99 %, unknown if currently breastfeeding. Urine output adequate: 04/25/2018 in the last 2 hours.  General: NAD Heart: RRR without murmur Pulmonary: no increased work of breathing/ CTAB Abdomen: non-distended, non-tender, fundus firm at level of umbilicus-2FB, hypoactive bowel sounds Incision: Prevena dressing C+D+I.; ON Q intact Extremities:SCDs on  Results for orders placed or performed during the hospital encounter of 02/24/19 (from the past 72 hour(s))  CBC     Status: Abnormal   Collection Time: 02/24/19  4:54 AM  Result Value Ref Range   WBC 9.6 4.0 - 10.5 K/uL   RBC 4.02 3.87 - 5.11 MIL/uL   Hemoglobin 10.3 (L) 12.0 - 15.0 g/dL   HCT 02/26/19 (L) 81.8 - 29.9 %   MCV 82.1 80.0 - 100.0 fL   MCH 25.6 (L) 26.0 - 34.0 pg   MCHC 31.2 30.0 - 36.0 g/dL   RDW 37.1 69.6 - 78.9 %   Platelets 245 150 - 400 K/uL   nRBC 0.0 0.0 - 0.2 %    Comment: Performed at [facility], [address] [place], [place]  RPR     Status: None   Collection Time: 02/24/19  4:54 AM  Result Value Ref Range   RPR Ser Ql NON REACTIVE NON REACTIVE    Comment: Performed at [facility], [address] [place], [place]  Type and screen     Status: None   Collection Time: 02/24/19  4:54 AM  Result Value Ref Range   ABO/RH(D) O POS    Antibody Screen NEG    Sample Expiration      02/27/2019,2359 Performed at  [facility], [address] [place], [place]   Comprehensive metabolic panel     Status: Abnormal   Collection Time: 02/24/19  4:54 AM  Result Value Ref Range   Sodium 134 (L) 135 - 145 mmol/L   Potassium 4.3 3.5 - 5.1 mmol/L   Chloride 106 98 - 111 mmol/L   CO2 20 (L) 22 - 32 mmol/L   Glucose, Bld 129 (H) 70 - 99 mg/dL   BUN 11 6 - 20 mg/dL   Creatinine, Ser 02/26/19 0.44 - 1.00 mg/dL   Calcium 9.0 8.9 - 4.23 mg/dL   Total Protein 6.4 (L) 6.5 - 8.1 g/dL   Albumin 2.5 (L) 3.5 - 5.0 g/dL   AST 16 15 - 41 U/L   ALT 7 0 - 44 U/L   Alkaline Phosphatase 307 (H) 38 - 126 U/L   Total Bilirubin 0.4 0.3 - 1.2 mg/dL   GFR calc non Af Amer >60 >60 mL/min   GFR calc Af Amer >60 >60 mL/min   Anion gap 8 5 - 15    Comment: Performed at [facility], [address] [place], [place]  Glucose, capillary     Status: Abnormal   Collection Time: 02/24/19  5:47 AM  Result Value Ref Range   Glucose-Capillary  109 (H) 70 - 99 mg/dL  Glucose, capillary     Status: None   Collection Time: 02/24/19  8:56 AM  Result Value Ref Range   Glucose-Capillary 99 70 - 99 mg/dL  Glucose, capillary     Status: None   Collection Time: 02/24/19 11:26 AM  Result Value Ref Range   Glucose-Capillary 76 70 - 99 mg/dL  Glucose, capillary     Status: None   Collection Time: 02/24/19  2:00 PM  Result Value Ref Range   Glucose-Capillary 76 70 - 99 mg/dL  Glucose, capillary     Status: Abnormal   Collection Time: 02/24/19  4:50 PM  Result Value Ref Range   Glucose-Capillary 66 (L) 70 - 99 mg/dL  Glucose, capillary     Status: None   Collection Time: 02/24/19  6:52 PM  Result Value Ref Range   Glucose-Capillary 72 70 - 99 mg/dL  Glucose, capillary     Status: Abnormal   Collection Time: 02/24/19  9:03 PM  Result Value Ref Range   Glucose-Capillary 53 (L) 70 - 99 mg/dL  Glucose, capillary     Status: None   Collection Time: 02/24/19 10:19 PM  Result Value Ref Range    Glucose-Capillary 87 70 - 99 mg/dL  Glucose, capillary     Status: None   Collection Time: 02/25/19 12:09 AM  Result Value Ref Range   Glucose-Capillary 92 70 - 99 mg/dL  Glucose, capillary     Status: Abnormal   Collection Time: 02/25/19  4:31 AM  Result Value Ref Range   Glucose-Capillary 106 (H) 70 - 99 mg/dL     Assessment:   24 y.o. [id number] postoperativeday # 0-stable 8 hours postop LTCS  Advance diet as tolerated/ check CBGs 2 hr pp and fasting in AM  DC foley later today when ambulatory  Encourage coughing and deep breathing GDMA2: CBGs have been WNL Idiopathic intracranial hypertension-no current headache Preeclampsia without severe features: mild range to normal blood pressures post op (downward trend)  Plan:  1) --/--/O POS (01/28 5009) / 1.42 (07/09 1639) / Varicella Immune  2) TDAP -given AP  3)Bottle/Contraception-undecided

## 2019-02-25 NOTE — Discharge Instructions (Signed)
Cesarean Delivery, Care After This sheet gives you information about how to care for yourself after your procedure. Your health care provider may also give you more specific instructions. If you have problems or questions, contact your health care provider. What can I expect after the procedure? After the procedure, it is common to have:  A small amount of blood or clear fluid coming from the incision.  Some redness, swelling, and pain in your incision area.  Some abdominal pain and soreness.  Vaginal bleeding (lochia). Even though you did not have a vaginal delivery, you will still have vaginal bleeding and discharge.  Pelvic cramps.  Fatigue. You may have pain, swelling, and discomfort in the tissue between your vagina and your anus (perineum) if:  Your C-section was unplanned, and you were allowed to labor and push.  An incision was made in the area (episiotomy) or the tissue tore during attempted vaginal delivery. Follow these instructions at home: Incision care   Follow instructions from your health care provider about how to take care of your incision. Make sure you: ? Wash your hands with soap and water before you change your bandage (dressing). If soap and water are not available, use hand sanitizer. ? If you have a dressing, change it or remove it as told by your health care provider. ? Leave stitches (sutures), skin staples, skin glue, or adhesive strips in place. These skin closures may need to stay in place for 2 weeks or longer. If adhesive strip edges start to loosen and curl up, you may trim the loose edges. Do not remove adhesive strips completely unless your health care provider tells you to do that.  Check your incision area every day for signs of infection. Check for: ? More redness, swelling, or pain. ? More fluid or blood. ? Warmth. ? Pus or a bad smell.  Do not take baths, swim, or use a hot tub until your health care provider says it's okay. Ask your health  care provider if you can take showers.  When you cough or sneeze, hug a pillow. This helps with pain and decreases the chance of your incision opening up (dehiscing). Do this until your incision heals. Medicines  Take over-the-counter and prescription medicines only as told by your health care provider.  If you were prescribed an antibiotic medicine, take it as told by your health care provider. Do not stop taking the antibiotic even if you start to feel better.  Do not drive or use heavy machinery while taking prescription pain medicine. Lifestyle  Do not drink alcohol. This is especially important if you are breastfeeding or taking pain medicine.  Do not use any products that contain nicotine or tobacco, such as cigarettes, e-cigarettes, and chewing tobacco. If you need help quitting, ask your health care provider. Eating and drinking  Drink at least 8 eight-ounce glasses of water every day unless told not to by your health care provider. If you breastfeed, you may need to drink even more water.  Eat high-fiber foods every day. These foods may help prevent or relieve constipation. High-fiber foods include: ? Whole grain cereals and breads. ? Brown rice. ? Beans. ? Fresh fruits and vegetables. Activity   If possible, have someone help you care for your baby and help with household activities for at least a few days after you leave the hospital.  Return to your normal activities as told by your health care provider. Ask your health care provider what activities are safe for   you.  Rest as much as possible. Try to rest or take a nap while your baby is sleeping.  Do not lift anything that is heavier than 10 lbs (4.5 kg), or the limit that you were told, until your health care provider says that it is safe.  Talk with your health care provider about when you can engage in sexual activity. This may depend on your: ? Risk of infection. ? How fast you heal. ? Comfort and desire to  engage in sexual activity. General instructions  Do not use tampons or douches until your health care provider approves.  Wear loose, comfortable clothing and a supportive and well-fitting bra.  Keep your perineum clean and dry. Wipe from front to back when you use the toilet.  If you pass a blood clot, save it and call your health care provider to discuss. Do not flush blood clots down the toilet before you get instructions from your health care provider.  Keep all follow-up visits for you and your baby as told by your health care provider. This is important. Contact a health care provider if:  You have: ? A fever. ? Bad-smelling vaginal discharge. ? Pus or a bad smell coming from your incision. ? Difficulty or pain when urinating. ? A sudden increase or decrease in the frequency of your bowel movements. ? More redness, swelling, or pain around your incision. ? More fluid or blood coming from your incision. ? A rash. ? Nausea. ? Little or no interest in activities you used to enjoy. ? Questions about caring for yourself or your baby.  Your incision feels warm to the touch.  Your breasts turn red or become painful or hard.  You feel unusually sad or worried.  You vomit.  You pass a blood clot from your vagina.  You urinate more than usual.  You are dizzy or light-headed. Get help right away if:  You have: ? Pain that does not go away or get better with medicine. ? Chest pain. ? Difficulty breathing. ? Blurred vision or spots in your vision. ? Thoughts about hurting yourself or your baby. ? New pain in your abdomen or in one of your legs. ? A severe headache.  You faint.  You bleed from your vagina so much that you fill more than one sanitary pad in one hour. Bleeding should not be heavier than your heaviest period. Summary  After the procedure, it is common to have pain at your incision site, abdominal cramping, and slight bleeding from your vagina.  Check  your incision area every day for signs of infection.  Tell your health care provider about any unusual symptoms.  Keep all follow-up visits for you and your baby as told by your health care provider. This information is not intended to replace advice given to you by your health care provider. Make sure you discuss any questions you have with your health care provider. Document Revised: 07/22/2017 Document Reviewed: 07/22/2017 Elsevier Patient Education  2020 Elsevier Inc.  

## 2019-02-25 NOTE — Transfer of Care (Signed)
Immediate Anesthesia Transfer of Care Note  Patient: Audrey Munoz  Procedure(s) Performed: CESAREAN SECTION (N/A )  Patient Location: Nursing Unit  Anesthesia Type:Epidural  Level of Consciousness: awake  Airway & Oxygen Therapy: Patient Spontanous Breathing  Post-op Assessment: Post -op Vital signs reviewed and stable  Post vital signs: stable  Last Vitals:  Vitals Value Taken Time  BP 165/92   Temp    Pulse    Resp    SpO2      Last Pain:  Vitals:   02/25/19 0118  TempSrc: Axillary  PainSc:       Patients Stated Pain Goal: 0 (02/24/19 1900)  Complications: No apparent anesthesia complications

## 2019-02-25 NOTE — Lactation Note (Signed)
This note was copied from a baby's chart. Lactation Consultation Note  Patient Name: Audrey Munoz [id number] Date: 02/25/2019 Reason for consult: Follow-up assessment;Primapara;Early term 37-38.6wks;Other (Comment)(low blood sugar after birth, GDM mom) Attempted at breast at 1800 with spanish interpreter Marchelle Folks present, baby cueing occ., unable to latch without nipple shield, attempted with 24 mm nipple shield, latched to right breast, sucked once and fell asleep, could not get him to suckle again, would just hold breast in mouth, since baby had low blood sugar after birth and mom GDM, set up symphony breast pump and mom double pumped in initiation mode, but obtained no colostrum to give baby, mom then requested to give baby formula to maintain blood sugar and attempt breastfeeding again at next feeding     Maternal Data Formula Feeding for Exclusion: Yes Has patient been taught Hand Expression?: Yes Does the patient have breastfeeding experience prior to this delivery?: No  Feeding Feeding Type: Bottle Fed - Formula 10 cc Good Start fed by dad LATCH Score Latch: Repeated attempts needed to sustain latch, nipple held in mouth throughout feeding, stimulation needed to elicit sucking reflex.(one suck)  Audible Swallowing: None  Type of Nipple: Everted at rest and after stimulation Large nipples Comfort (Breast/Nipple): Filling, red/small blisters or bruises, mild/mod discomfort Firm breasts, firm thick areola and nipples with crusty skin. Hold (Positioning): Assistance needed to correctly position infant at breast and maintain latch.  LATCH Score: 5  Interventions Interventions: DEBP  Lactation Tools Discussed/Used Tools: Nipple Shields Nipple shield size: 24 WIC Program: Yes Pump Review: Setup, frequency, and cleaning;Milk Storage Initiated by:: Cay Schillings RNC IBCLC Date initiated:: 02/25/19   Consult Status Consult Status: Follow-up Date: 02/26/19 Follow-up type:  In-patient    Dyann Kief 02/25/2019, 7:26 PM

## 2019-02-25 NOTE — Progress Notes (Signed)
  Labor Progress Note   24 y.o. [id number] @ [redacted]w[redacted]d , admitted for  Pregnancy, Labor Management.   Subjective:  Pt has made it to 10 cm and has had 2 hours of labor down and pushing efforts with now maternal fatigue and arrest of descent noted.  She has been offered options for VAVD as well as CS to assist delivery.  Fetal wellbeing reassuring.  Pt expresses desire not to have vacuum assistance and asks for cesarean section.  Pt is explained the pros and cons of each choice as well as the choice of continuing with pushing alone.    Objective:  BP 116/63   Pulse 91   Temp 100.3 F (37.9 C) (Axillary)   Resp 20   Ht 5\' 3"  (1.6 m)   Wt 91.6 kg   LMP 04/23/2018   SpO2 99%   BMI 35.78 kg/m  Abd: gravid, ND, FHT present, moderate tenderness on exam Extr: trace to 1+ bilateral pedal edema SVE: C/C/+1  EFM: FHR: 130s bpm, variability: moderate,  accelerations:  Present,  decelerations:  Absent Toco: Frequency: Every 2-4 minutes Labs: I have reviewed the patient's lab results.   Assessment & Plan:  04/25/2018 @ [redacted]w[redacted]d, admitted for  Pregnancy and Labor/Delivery Management  1. Pain management: epidural. 2. FWB: FHT category 1.  3. ID: GBS negative 4. Labor management: See above as to discussion and plan of management. Will proceed with cesarean section to facilitate delivery. The risks of cesarean section discussed with the patient included but were not limited to: bleeding which may require transfusion or reoperation; infection which may require antibiotics; injury to bowel, bladder, ureters or other surrounding organs; injury to the fetus; need for additional procedures including hysterectomy in the event of a life-threatening hemorrhage; placental abnormalities wth subsequent pregnancies, incisional problems, thromboembolic phenomenon and other postoperative/anesthesia complications. The patient concurred with the proposed plan, giving informed written consent for the procedure.  Interpreter  present to assist discussion and consent.  All discussed with patient, see orders  [redacted]w[redacted]d, MD, Annamarie Major Ob/Gyn, [facility] 02/25/2019  1:38 AM

## 2019-02-25 NOTE — Lactation Note (Signed)
This note was copied from a baby's chart. Lactation Consultation Note  Patient Name: Audrey Munoz Date: 02/25/2019 Reason for consult: Initial assessment;Primapara;1st time breastfeeding;Early term 37-38.6wks   Maternal Data Formula Feeding for Exclusion: No Has patient been taught Hand Expression?: Yes Does the patient have breastfeeding experience prior to this delivery?: No  Feeding  Parents have been feeding baby formula because of tiredness and pain, desire to attempt breast feeding now, baby asleep and not cueing but awakened at attempt, latch attempted , mom's nipples and areola firm with thick dry skin, baby unable to latch at present, placed skin to skin to observe for feeding cues, .  LATCH Score Latch: Too sleepy or reluctant, no latch achieved, no sucking elicited.  Audible Swallowing: None  Type of Nipple: Everted at rest and after stimulation(skin crusty and hard on areola and nipple)  Comfort (Breast/Nipple): Filling, red/small blisters or bruises, mild/mod discomfort, areola thick and dry, coconut oil applied in large amt to nipples and areola  Hold (Positioning): Full assist, staff holds infant at breast  LATCH Score: 3  Interventions Interventions: Breast feeding basics reviewed;Assisted with latch;Skin to skin;Breast compression;Coconut oil  Lactation Tools Discussed/Used     Consult Status Consult Status: Follow-up Date: 02/25/19 Follow-up type: In-patient    Dyann Kief 02/25/2019, 5:28 PM

## 2019-02-25 NOTE — Op Note (Signed)
Cesarean Section Procedure Note Indications: cephalo-pelvic disproportion, failure to progress: arrest of descent and term intrauterine pregnancy  Pre-operative Diagnosis: cephalo-pelvic disproportion, failure to progress: arrest of descent, GDMA2, and term intrauterine pregnancy Post-operative Diagnosis: same, delivered. Procedure: Low Transverse Cesarean Section Surgeon: Annamarie Major, MD, FACOG Assistant(s): S [address], tech Anesthesia: Epidural anesthesia Estimated Blood Loss:662 mL Complications: None; patient tolerated the procedure well. Disposition: PACU - hemodynamically stable. Condition: stable  Findings: A female infant in the cephalic presentation. "Molli Hazard" Amniotic fluid - Clear  Birth weight 7-5 lbs.  Apgars of 9 and 9.  Intact placenta with a three-vessel cord. Grossly normal uterus, tubes and ovaries bilaterally. No intraabdominal adhesions were noted.  Procedure Details   The patient was taken to Operating Room, identified as the correct patient and the procedure verified as C-Section Delivery. A Time Out was held and the above information confirmed. After induction of anesthesia, the patient was draped and prepped in the usual sterile manner. A Pfannenstiel incision was made and carried down through the subcutaneous tissue to the fascia. Fascial incision was made and extended transversely with the Mayo scissors. The fascia was separated from the underlying rectus tissue superiorly and inferiorly. The peritoneum was identified and entered bluntly. Peritoneal incision was extended longitudinally. The utero-vesical peritoneal reflection was incised transversely and a bladder flap was created digitally.  A low transverse hysterotomy was made. The fetus was delivered atraumatically. The umbilical cord was clamped x2 and cut and the infant was handed to the awaiting pediatricians. The placenta was removed intact and appeared normal with a 3-vessel cord.  The uterus was exteriorized  and cleared of all clot and debris. The hysterotomy was closed with running sutures of 0 Vicryl suture. A second imbricating layer was placed with the same suture. Excellent hemostasis was observed. The uterus was returned to the abdomen. The pelvis was irrigated and again, excellent hemostasis was noted.  The On Q Pain pump System was then placed.  Trocars were placed through the abdominal wall into the subfascial space and these were used to thread the silver soaker cathaters into place.The rectus fascia was then reapproximated with running sutures of Maxon, with careful placement not to incorporate the cathaters. Subcutaneous tissues are then irrigated with saline and hemostasis assured.  Skin is then closed with staples and then the Prevena dressing applied w suction. The cathaters are flushed each with 5 mL of Bupivicaine and stabilized into place with dressing. Instrument, sponge, and needle counts were correct prior to the abdominal closure and at the conclusion of the case.  The patient tolerated the procedure well and was transferred to the recovery room in stable condition.   Annamarie Major, MD, Merlinda Frederick Ob/Gyn, [facility] 02/25/2019  3:44 AM

## 2019-02-26 LAB — CREATININE, SERUM
Creatinine, Ser: 0.86 mg/dL (ref 0.44–1.00)
GFR calc Af Amer: >60 mL/min (ref >60)
GFR calc non Af Amer: >60 mL/min (ref >60)

## 2019-02-26 LAB — GLUCOSE, CAPILLARY: Glucose-Capillary: 106 mg/dL — ABNORMAL HIGH (ref 70–99)

## 2019-02-26 LAB — COMPREHENSIVE METABOLIC PANEL
ALT: 8 U/L (ref 0–44)
AST: 19 U/L (ref 15–41)
Albumin: 1.9 g/dL — ABNORMAL LOW (ref 3.5–5.0)
Alkaline Phosphatase: 205 U/L — ABNORMAL HIGH (ref 38–126)
Anion gap: 7 (ref 5–15)
BUN: 19 mg/dL (ref 6–20)
CO2: 21 mmol/L — ABNORMAL LOW (ref 22–32)
Calcium: 7.7 mg/dL — ABNORMAL LOW (ref 8.9–10.3)
Chloride: 108 mmol/L (ref 98–111)
Creatinine, Ser: 1.19 mg/dL — ABNORMAL HIGH (ref 0.44–1.00)
GFR calc Af Amer: >60 mL/min (ref >60)
GFR calc non Af Amer: >60 mL/min (ref >60)
Glucose, Bld: 99 mg/dL (ref 70–99)
Potassium: 4.7 mmol/L (ref 3.5–5.1)
Sodium: 136 mmol/L (ref 135–145)
Total Bilirubin: 0.4 mg/dL (ref 0.3–1.2)
Total Protein: 4.8 g/dL — ABNORMAL LOW (ref 6.5–8.1)

## 2019-02-26 LAB — CBC
HCT: 23.1 % — ABNORMAL LOW (ref 36.0–46.0)
HCT: 22.6 % — ABNORMAL LOW (ref 36.0–46.0)
Hemoglobin: 7.2 g/dL — ABNORMAL LOW (ref 12.0–15.0)
Hemoglobin: 6.9 g/dL — ABNORMAL LOW (ref 12.0–15.0)
MCH: 25.9 pg — ABNORMAL LOW (ref 26.0–34.0)
MCH: 25.3 pg — ABNORMAL LOW (ref 26.0–34.0)
MCHC: 31.2 g/dL (ref 30.0–36.0)
MCHC: 30.5 g/dL (ref 30.0–36.0)
MCV: 83.1 fL (ref 80.0–100.0)
MCV: 82.8 fL (ref 80.0–100.0)
Platelets: 186 10*3/uL (ref 150–400)
Platelets: 184 10*3/uL (ref 150–400)
RBC: 2.78 MIL/uL — ABNORMAL LOW (ref 3.87–5.11)
RBC: 2.73 MIL/uL — ABNORMAL LOW (ref 3.87–5.11)
RDW: 14.3 % (ref 11.5–15.5)
RDW: 14.5 % (ref 11.5–15.5)
WBC: 13.5 10*3/uL — ABNORMAL HIGH (ref 4.0–10.5)
WBC: 13.2 10*3/uL — ABNORMAL HIGH (ref 4.0–10.5)
nRBC: 0 % (ref 0.0–0.2)
nRBC: 0 % (ref 0.0–0.2)

## 2019-02-26 MED ORDER — FERROUS SULFATE 325 (65 FE) MG PO TABS
325.0000 mg | ORAL_TABLET | Freq: Two times a day (BID) | ORAL | Status: DC
  Administered 2019-02-26 – 2019-02-27 (×3): 325 mg via ORAL
  Filled 2019-02-26 (×3): qty 1

## 2019-02-26 NOTE — Anesthesia Postprocedure Evaluation (Signed)
Anesthesia Post Note  Patient: Audrey Munoz  Procedure(s) Performed: CESAREAN SECTION (N/A )  Patient location during evaluation: Mother Baby Anesthesia Type: Epidural Level of consciousness: awake and alert Pain management: pain level controlled Vital Signs Assessment: post-procedure vital signs reviewed and stable Respiratory status: spontaneous breathing, nonlabored ventilation and respiratory function stable Cardiovascular status: stable Postop Assessment: no headache, no backache and epidural receding Anesthetic complications: no     Last Vitals:  Vitals:   02/26/19 0600 02/26/19 0728  BP:  138/80  Pulse: 97 (!) 101  Resp:  18  Temp:  36.8 C  SpO2: 97% 100%    Last Pain:  Vitals:   02/26/19 0728  TempSrc: Oral  PainSc:                  Karoline Caldwell

## 2019-02-26 NOTE — Progress Notes (Signed)
Paged Dr about low UOP AT 0025. Per Dr., CBC, CMP to be drawn and LR to be increased from 150mL/hr to 226mL/hr for 4 hours. Updated Dr. about labs and UOP. Per Dr., after for 4 hours is complete; decrease fluids back down to 148mL/hr. At 0630 RN assessed UOP. UOP at this time is 146mL/hr since 0310. RN removed catheter at [phone] per protocol.

## 2019-02-26 NOTE — Progress Notes (Signed)
Admit Date: 02/24/2019 Today's Date: 02/26/2019  Subjective: Postpartum Day 1: Cesarean Delivery Patient reports incisional pain, tolerating PO and + flatus.   History and discussion assisted with an interpreter  Objective: Vital signs in last 24 hours: Temp:  [98 F (36.7 C)-98.3 F (36.8 C)] 98.2 F (36.8 C) (01/30 0728) Pulse Rate:  [77-115] 101 (01/30 0728) Resp:  [18-20] 18 (01/30 0728) BP: (107-138)/(57-81) 138/80 (01/30 0728) SpO2:  [95 %-100 %] 100 % (01/30 0728)  Physical Exam:  General: alert, cooperative and no distress Lochia: appropriate Uterine Fundus: firm Incision: healing well, no significant drainage, no dehiscence, no significant erythema DVT Evaluation: No evidence of DVT seen on physical exam. Negative Homan's sign. No cords or calf tenderness. No significant calf/ankle edema.  Recent Labs    02/24/19 0454 02/26/19 0055  HGB 10.3* 7.2*  HCT 33.0* 23.1*    Assessment/Plan: Status post Cesarean section. Doing well postoperatively.  Continue current care. Monitor UOP, improved this am.  Labs at 1200. Anemia, monitor for s/sx dizzines, weakness. BS WNL.  Reduce frequency of testing Uncertain contraception Breast feeding  Letitia Libra 02/26/2019, 9:26 AM

## 2019-02-26 NOTE — Progress Notes (Signed)
Updated by RN that last 3 hours, UOP = 150 mL.

## 2019-02-26 NOTE — Anesthesia Post-op Follow-up Note (Signed)
  Anesthesia Pain Follow-up Note  Patient: Audrey Munoz  Day #: 1  Date of Follow-up: 02/26/2019 Time: 9:08 AM  Last Vitals:  Vitals:   02/26/19 0600 02/26/19 0728  BP:  138/80  Pulse: 97 (!) 101  Resp:  18  Temp:  36.8 C  SpO2: 97% 100%    Level of Consciousness: alert  Pain: none   Side Effects:None  Catheter Site Exam:clean, dry, no drainage     Plan: D/C from anesthesia care at surgeon's request  Karoline Caldwell

## 2019-02-26 NOTE — Progress Notes (Signed)
Notified of decrease urine output below 64mL/hour yesterday evening. Bolus of isotonic fluid given 500 mL over 1 hour, then maintenance rate of 125 mL/hour ordered.  Notified of continued lower urine output.  CBC and CMP ordered and fluid rate increased to 250 mL/hour. CMP showed Creatinine of 1.1.9.  Hgb/hct 7.2/23.1 noted.  Baseline creatinine of 0.46 two days ago.   Will continue to monitor urine output closely and rehydrate as needed.   Thomasene Mohair, MD, [facility], [facility] 02/26/2019 2:56 AM

## 2019-02-27 LAB — GLUCOSE, CAPILLARY: Glucose-Capillary: 76 mg/dL (ref 70–99)

## 2019-02-27 MED ORDER — OXYCODONE-ACETAMINOPHEN 5-325 MG PO TABS: 1.0000 | ORAL_TABLET | ORAL | 0 refills | Status: DC | PRN

## 2019-02-27 MED ORDER — FERROUS SULFATE 325 (65 FE) MG PO TABS: 325.0000 mg | ORAL_TABLET | Freq: Two times a day (BID) | ORAL | 0 refills | Status: DC

## 2019-02-27 NOTE — Progress Notes (Signed)
Admit Date: 02/24/2019 Today's Date: 02/27/2019  Subjective: Postpartum Day 3: Cesarean Delivery Patient reports tolerating PO.  Min pain Breast feeding and bottle No depression  Intrepreter present for counseling and discussion   Objective: Vital signs in last 24 hours: Temp:  [97.7 F (36.5 C)-98.7 F (37.1 C)] 98.7 F (37.1 C) (01/31 0908) Pulse Rate:  [88-108] 93 (01/31 0908) Resp:  [18-20] 20 (01/31 0908) BP: (123-139)/(76-90) 139/90 (01/31 0908) SpO2:  [99 %-100 %] 99 % (01/31 0908)  Physical Exam:  General: alert, cooperative and no distress Lochia: appropriate Uterine Fundus: firm Incision: healing well, no significant drainage, no dehiscence, no significant erythema DVT Evaluation: No evidence of DVT seen on physical exam. Negative Homan's sign.  Recent Labs    02/26/19 0055 02/26/19 1157  HGB 7.2* 6.9*  HCT 23.1* 22.6*    Assessment/Plan: Status post Cesarean section. Doing well postoperatively.  Continue current care.  Letitia Libra 02/27/2019, 11:51 AM

## 2019-02-27 NOTE — Progress Notes (Addendum)
Discharge instructions given. Interpreter used. Patient verbalizes understanding of teaching. Patient discharged home via wheelchair at 1530.

## 2019-03-03 ENCOUNTER — Other Ambulatory Visit: Payer: Self-pay

## 2019-03-03 ENCOUNTER — Ambulatory Visit (INDEPENDENT_AMBULATORY_CARE_PROVIDER_SITE_OTHER): Payer: Medicaid Other | Admitting: Obstetrics & Gynecology

## 2019-03-03 ENCOUNTER — Encounter: Payer: Self-pay | Admitting: Obstetrics & Gynecology

## 2019-03-03 VITALS — BP 140/80 | Wt 192.0 lb

## 2019-03-03 DIAGNOSIS — Z98891 History of uterine scar from previous surgery: Secondary | ICD-10-CM

## 2019-03-03 DIAGNOSIS — Z48816 Encounter for surgical aftercare following surgery on the genitourinary system: Secondary | ICD-10-CM

## 2019-03-03 MED ORDER — OXYCODONE-ACETAMINOPHEN 5-325 MG PO TABS: 1.0000 | ORAL_TABLET | ORAL | 0 refills | Status: DC | PRN

## 2019-03-03 MED ORDER — MEDROXYPROGESTERONE ACETATE 150 MG/ML IM SUSP: 150.0000 mg | INTRAMUSCULAR | 0 refills | Status: DC

## 2019-03-03 NOTE — Progress Notes (Signed)
  Postoperative Follow-up Patient presents post op from recent Cesarean Section performed for Arrest of Descent, 1 week ago.   Subjective: Patient reports some improvement in her immediate post op symptoms. Eating a regular diet without difficulty. Pain is controlled with current analgesics. Medications being used: ibuprofen (OTC) and narcotic analgesics including Percocet.  Activity: physically active resting mostly, no lifting. Patient reports additional symptom's since surgery of appropriate lochia, no signs of depression, and no signs of mastitis.  Objective: BP 140/80   Wt 192 lb (87.1 kg)   BMI 34.01 kg/m  Physical Exam Constitutional:      General: She is not in acute distress.    Appearance: She is well-developed.  Cardiovascular:     Rate and Rhythm: Normal rate.  Pulmonary:     Effort: Pulmonary effort is normal.  Abdominal:     General: There is no distension.     Palpations: Abdomen is soft.     Tenderness: There is no abdominal tenderness.     Comments: Incision Healing Well   Musculoskeletal:        General: Normal range of motion.  Neurological:     Mental Status: She is alert and oriented to person, place, and time.     Cranial Nerves: No cranial nerve deficit.  Skin:    General: Skin is warm and dry.   Staples removed and steristrips placed  Assessment: s/p : Cesarean Section for Arrest of Descent stable  Plan: Patient has done well after her Cesarean Section with no apparent complications.  I have discussed the post-operative course to date, and the expected progress moving forward.  The patient understands what complications to be concerned about.  I will see the patient in routine follow up, or sooner if needed.    Activity plan: No heavy lifting.Marland Kitchen  Pelvic rest. She desires Depo-Provera for postpartum contraception.  Audrey Munoz 03/03/2019, 10:45 AM

## 2019-03-04 ENCOUNTER — Ambulatory Visit: Payer: Medicaid Other | Admitting: Obstetrics and Gynecology

## 2019-03-18 NOTE — Progress Notes (Signed)
Chart reviewed by Pharmacist  Suzanne Walker PharmD, Contract Pharmacist at [facility]  

## 2019-04-15 ENCOUNTER — Ambulatory Visit: Payer: BC Managed Care – PPO | Attending: Internal Medicine

## 2019-04-15 DIAGNOSIS — Z23 Encounter for immunization: Secondary | ICD-10-CM

## 2019-04-15 NOTE — Progress Notes (Signed)
   Covid-19 Vaccination Clinic  Name:  Audrey Munoz    MRN: [id number] DOB: December 01, 1995  04/15/2019  [name] was observed post Covid-19 immunization for 15 minutes without incident. She was provided with Vaccine Information Sheet and instruction to access the V-Safe system.   [name] was instructed to call 911 with any severe reactions post vaccine: Marland Kitchen Difficulty breathing  . Swelling of face and throat  . A fast heartbeat  . A bad rash all over body  . Dizziness and weakness   Immunizations Administered    Name Date Dose VIS Date Route   Pfizer COVID-19 Vaccine 04/15/2019  9:17 AM 0.3 mL 01/07/2019 Intramuscular   Manufacturer: [employer], [employer]   Lot: [id number]   NDC: [address]-1

## 2019-04-22 ENCOUNTER — Other Ambulatory Visit: Payer: Self-pay

## 2019-04-22 ENCOUNTER — Ambulatory Visit (INDEPENDENT_AMBULATORY_CARE_PROVIDER_SITE_OTHER): Payer: BC Managed Care – PPO | Admitting: Obstetrics & Gynecology

## 2019-04-22 ENCOUNTER — Encounter: Payer: Self-pay | Admitting: Obstetrics & Gynecology

## 2019-04-22 VITALS — BP 120/80 | Ht 66.0 in | Wt 176.0 lb

## 2019-04-22 DIAGNOSIS — O24439 Gestational diabetes mellitus in the puerperium, unspecified control: Secondary | ICD-10-CM

## 2019-04-22 DIAGNOSIS — Z3042 Encounter for surveillance of injectable contraceptive: Secondary | ICD-10-CM

## 2019-04-22 DIAGNOSIS — Z8632 Personal history of gestational diabetes: Secondary | ICD-10-CM

## 2019-04-22 DIAGNOSIS — Z98891 History of uterine scar from previous surgery: Secondary | ICD-10-CM

## 2019-04-22 HISTORY — DX: Personal history of gestational diabetes: Z86.32

## 2019-04-22 MED ORDER — MEDROXYPROGESTERONE ACETATE 150 MG/ML IM SUSP
150.0000 mg | Freq: Once | INTRAMUSCULAR | Status: DC
  Administered 2019-04-22: 150 mg via INTRAMUSCULAR

## 2019-04-22 MED ORDER — MEDROXYPROGESTERONE ACETATE 150 MG/ML IM SUSP: 150.0000 mg | Freq: Once | INTRAMUSCULAR | Status: DC

## 2019-04-22 NOTE — Patient Instructions (Signed)
Annual in July    PAP    Diabetes test       (come fasting)

## 2019-04-22 NOTE — Progress Notes (Signed)
  OBSTETRICS POSTPARTUM CLINIC PROGRESS NOTE  Subjective:     Audrey Munoz is a 24 y.o. [phone] female who presents for a postpartum visit. She is 6 weeks postpartum following a Term pregnancy or Pregnancy complicated by:  Her pregnancy has been complicated by GDMA2 (currently on 28 U Lantus q PM), idiopathic intracranial hypertension (diagnosed during pregnancy when she presented with headaches, visual changes and papilledema/ was on Diamox), obesity,  and mild preeclampsia; and delivery by C-section failure to progress.  I have fully reviewed the prenatal and intrapartum course. Anesthesia: epidural.  Postpartum course has been complicated by uncomplicated.  Baby is feeding by Bottle.  Bleeding: patient has not  resumed menses.  Bowel function is normal. Bladder function is normal.  Patient is not sexually active. Contraception method desired is Depo-Provera injections.  Postpartum depression screening: negative. Edinburgh 0.  The following portions of the patient's history were reviewed and updated as appropriate: allergies, current medications, past family history, past medical history, past social history, past surgical history and problem list.  Review of Systems Pertinent items are noted in HPI.  Objective:    BP 120/80   Ht 5\' 6"  (1.676 m)   Wt 176 lb (79.8 kg)   BMI 28.41 kg/m   General:  alert and no distress   Breasts:  inspection negative, no nipple discharge or bleeding, no masses or nodularity palpable  Lungs: clear to auscultation bilaterally  Heart:  regular rate and rhythm, S1, S2 normal, no murmur, click, rub or gallop  Abdomen: soft, non-tender; bowel sounds normal; no masses,  no organomegaly.  Well healed Pfannenstiel incision   Vulva:  normal  Vagina: normal vagina, no discharge, exudate, lesion, or erythema  Cervix:  no cervical motion tenderness and no lesions  Corpus: normal size, contour, position, consistency, mobility, non-tender  Adnexa:  normal  adnexa and no mass, fullness, tenderness  Rectal Exam: Not performed.          Assessment:  Post Partum Care visit 1. S/P cesarean section  2. Postpartum care following cesarean delivery No further Insulin Will check blood sugar test at Annual (2 hr GTT)  Plan:  See orders and Patient Instructions Contraceptive counseling for Depo-Provera Resume all normal activities Follow up in: 4 months for PAP/ANnual or as needed.   , MD, Annamarie Major Ob/Gyn, [facility] 04/22/2019  9:23 AM

## 2019-04-22 NOTE — Addendum Note (Signed)
Addended by: Cornelius Moras D on: 04/22/2019 10:05 AM   Modules accepted: Orders

## 2019-05-10 ENCOUNTER — Ambulatory Visit: Payer: Medicaid Other | Attending: Internal Medicine

## 2019-05-10 DIAGNOSIS — Z23 Encounter for immunization: Secondary | ICD-10-CM

## 2019-05-10 NOTE — Progress Notes (Signed)
   Covid-19 Vaccination Clinic  Name:  Audrey Munoz    MRN: [id number] DOB: 1995/08/08  05/10/2019  [name] was observed post Covid-19 immunization for 15 minutes without incident. She was provided with Vaccine Information Sheet and instruction to access the V-Safe system.   [name] was instructed to call 911 with any severe reactions post vaccine: Marland Kitchen Difficulty breathing  . Swelling of face and throat  . A fast heartbeat  . A bad rash all over body  . Dizziness and weakness   Immunizations Administered    Name Date Dose VIS Date Route   Pfizer COVID-19 Vaccine 05/10/2019 10:40 AM 0.3 mL 01/07/2019 Intramuscular   Manufacturer: [employer], [employer]   Lot: [id number]   NDC: [address]-7

## 2019-05-30 ENCOUNTER — Other Ambulatory Visit: Payer: Self-pay | Admitting: Obstetrics & Gynecology

## 2019-07-18 ENCOUNTER — Other Ambulatory Visit: Payer: Self-pay

## 2019-07-18 ENCOUNTER — Ambulatory Visit (INDEPENDENT_AMBULATORY_CARE_PROVIDER_SITE_OTHER): Payer: Medicaid Other

## 2019-07-18 DIAGNOSIS — Z3042 Encounter for surveillance of injectable contraceptive: Secondary | ICD-10-CM | POA: Diagnosis not present

## 2019-07-18 MED ORDER — MEDROXYPROGESTERONE ACETATE 150 MG/ML IM SUSP
150.0000 mg | Freq: Once | INTRAMUSCULAR | Status: AC
  Administered 2019-07-18: 150 mg via INTRAMUSCULAR

## 2019-07-18 NOTE — Progress Notes (Signed)
Pt here for depo which was given IM left glut.  NDC# [phone]  Pt c/o face breaking out - adv depo could couse that.

## 2019-08-22 ENCOUNTER — Encounter: Payer: Self-pay | Admitting: Obstetrics & Gynecology

## 2019-08-22 ENCOUNTER — Other Ambulatory Visit: Payer: Self-pay

## 2019-08-22 ENCOUNTER — Telehealth: Payer: Self-pay | Admitting: Obstetrics & Gynecology

## 2019-08-22 ENCOUNTER — Other Ambulatory Visit (HOSPITAL_COMMUNITY)
Admission: RE | Admit: 2019-08-22 | Discharge: 2019-08-22 | Disposition: A | Discharge status: Home / Self Care | Payer: Medicaid Other | Source: Ambulatory Visit | Attending: Obstetrics & Gynecology | Admitting: Obstetrics & Gynecology

## 2019-08-22 ENCOUNTER — Other Ambulatory Visit: Payer: Medicaid Other

## 2019-08-22 ENCOUNTER — Ambulatory Visit (INDEPENDENT_AMBULATORY_CARE_PROVIDER_SITE_OTHER): Payer: Medicaid Other | Admitting: Obstetrics & Gynecology

## 2019-08-22 VITALS — BP 120/80 | Ht 63.0 in | Wt 175.0 lb

## 2019-08-22 DIAGNOSIS — Z124 Encounter for screening for malignant neoplasm of cervix: Secondary | ICD-10-CM | POA: Insufficient documentation

## 2019-08-22 DIAGNOSIS — Z0000 Encounter for general adult medical examination without abnormal findings: Secondary | ICD-10-CM

## 2019-08-22 DIAGNOSIS — R1031 Right lower quadrant pain: Secondary | ICD-10-CM | POA: Diagnosis not present

## 2019-08-22 DIAGNOSIS — Z131 Encounter for screening for diabetes mellitus: Secondary | ICD-10-CM | POA: Diagnosis not present

## 2019-08-22 NOTE — Telephone Encounter (Signed)
PARAGUARD ON 8/9 WITH RPH AT 1040

## 2019-08-22 NOTE — Patient Instructions (Signed)
Levonorgestrel intrauterine device (IUD) O que  este medicamento? O LEVONORGESTREL IUD (DIU)  um dispositivo contraceptivo (anticoncepcional). Este dispositivo  colocado dentro do tero por um profissional de sade.  usado para prevenir a Environmental education officer. Este dispositivo tambm pode ser utilizado para tratar a hemorragia intensa que ocorre durante o seu perodo menstrual. Este medicamento pode ser usado para outros propsitos; em caso de dvidas, pergunte ao seu profissional de sade ou Social research officer, government. NOMES DE MARCAS COMUNS: Rutha Bouchard, LILETTA, Mirena, Skyla O que devo dizer a meu profissional de sade antes de tomar este medicamento? Precisam saber se voc tem algum dos seguintes problemas ou estados de sade:  Papanicolau anormal  cncer de mama, do tero ou do colo do tero  diabetes  endometrite  infeco genital ou plvica atual ou no passado  tem mais de um parceiro sexual ou seu parceiro tem mais de um parceiro sexual  doenas cardacas  histrico de Environmental education officer ectpica ou tubria  problemas do sistema imunitrio  DIU implantado  doena ou tumor no fgado  problemas com cogulos sanguneos ou tomando medicamentos que afinam o sangue  convulses (crises convulsivas)  [place] drogas injetveis  tero com formato irregular  sangramento vaginal que ainda no foi explicado  reao estranha ou alergia ao levonorgestrel, a outros hormnios, ao silicone ou ao polietileno  reao estranha ou alergia a outros medicamentos, alimentos, corantes ou conservantes  est grvida ou tentando engravidar  est amamentando Como devo usar este medicamento? Este dispositivo  colocado dentro do tero por um profissional de sade. Fale com seu pediatra a respeito do uso deste medicamento em crianas. Pode ser preciso tomar alguns cuidados especiais. Superdosagem: Se achar que tomou uma superdosagem deste medicamento, entre em contato imediatamente com o [facility] ou v  a [facility]. OBSERVAO: Este medicamento  s para voc. No compartilhe este medicamento com outras pessoas. E se eu deixar de tomar uma dose? Isto no se aplica. Se deseja continuar usando este tipo de [place], ele dever ser substitudo a cada 3 a 6 anos, dependendo da marca do dispositivo inserido. O que pode interagir com este medicamento? No tome este medicamento com nenhum dos seguintes:  amprenavir  bosentana  fosamprenavir Este medicamento tambm pode interagir com os seguintes remdios:  aprepitanto  armodafinila  barbitricos para dormir ou para tratar crises convulsivas  bexaroteno  boceprevir  griseofulvina  alguns medicamentos para crises convulsivas, como carbamazepina, [place], [place], [place], [place], topiramato  modafinila  pioglitazona  rifabutina  rifampicina  rifapentina  alguns medicamentos contra a infeco pelo HIV, como atazanavir, efavirenz, indinavir, lopinavir, nelfinavir, tipranavir e ritonavir  erva-de-so-joo  varfarina Esta lista pode no descrever todas as interaes possveis. D ao seu profissional de sade uma lista de todos os medicamentos, ervas medicinais, remdios de venda livre, ou suplementos alimentares que voc [place]. Diga tambm se voc fuma, bebe, ou [place] drogas ilcitas. Alguns destes podem interagir com o seu medicamento. Ao que devo ficar atento quando estiver [place] medicamento? Consulte seu mdico ou profissional de sade para acompanhamento regular Dealer. Consulte o seu mdico se voc ou seu parceiro tiverem contato sexual com outras pessoas, se tornar-se HIV positiva ou se contrair uma doena transmitida sexualmente. Este medicamento no protege contra uma possvel infeco pelo HIV, AIDS ou outras doenas sexualmente transmissveis. Voc pode verificar a colocao do DIU tocando a parte superior da vagina com os dedos limpos para apalpar os fios. No puxe os fios.  um  bom hbito verificar o posicionamento  do dispositivo aps cada perodo menstrual. Entre imediatamente em contato com o seu mdico ou profissional de sade se seus dedos apalparem mais do corpo do DIU do que apenas os fios ou se no puder apalpar os fios. O DIU pode sair do corpo. Voc pode engravidar se o dispositivo sair. Se voc notar que o DIU est deslocado, use um mtodo anticoncepcional secundrio, como preservativos, e entre em contato com o seu mdico ou profissional de sade. Absorventes internos no interferem com a posio do DIU e podem ser usados durante o seu perodo menstrual. Portadoras deste DIU s podem passar por uma ressonncia magntica (RM) com segurana sob condies especficas. Antes de passar por uma RM, informe o seu mdico ou profissional de sade que tem um DIU implantado e qual  o tipo do dispositivo. Que efeitos colaterais posso sentir aps usar este medicamento? Efeitos colaterais que devem ser informados ao seu mdico ou profissional de sade o mais rpido possvel:  reaes alrgicas, como erupo na pele, coceira, urticria, ou inchao do rosto, dos lbios ou da lngua  febre ou sintomas gripais  lceras genitais  presso alta  ausncia de [place] por 6 semanas durante o uso  dor, inchao, calor na perna  dor plvica ou sensibilidade ao toque  dor de cabea forte ou sbita  sinais de gravidez  clicas abdominais  falta de ar sbita  dificuldades de equilbrio, para falar, para andar  sangramento ou corrimento vaginal fora do comum  olhos ou pele amarelados Efeitos colaterais que normalmente no precisam de cuidados mdicos (avise ao seu mdico ou profissional de sade se persistirem ou forem incmodos):  acne  dor nos seios  mudana na libido ou no desempenho sexual  ganho ou perda de peso  clicas, tonturas ou desmaio durante a insero do dispositivo  dor de cabea  menstruao irregular durante os primeiros 3 a 6 meses de  uso  enjoo Esta lista pode no descrever todos os efeitos colaterais possveis. Para mais orientaes sobre efeitos colaterais, consulte o seu mdico. Voc pode relatar a ocorrncia de efeitos colaterais  FDA pelo telefone [phone]. Onde devo guardar meu medicamento? Isto no se aplica. OBSERVAO: Este folheto  um resumo. Pode no cobrir todas as informaes possveis. Se tiver dvidas a respeito deste medicamento, fale com seu mdico, farmacutico ou profissional de sade.  2020 Elsevier/Gold Standard (2018-02-04 00:00:00)

## 2019-08-22 NOTE — Progress Notes (Signed)
HPI:      [name] is a 24 y.o. [phone] who LMP was Patient's last menstrual period was 03/28/2019., she presents today for her annual examination. The patient has no complaints today.  No period on Depo (Mar, Jun injections).  Has monthly RLQ pain w radiation to back, mild to moderate, no associated sx's or modifiers. The patient is sexually active. Her last pap: approximate date 2020 and was normal. The patient does perform self breast exams.  There is no notable family history of breast or ovarian cancer in her family.  The patient has regular exercise: yes.  The patient denies current symptoms of depression.    GYN History: Contraception: Depo-Provera injections, no periods S/p CS 01/2019  PMHx: Past Medical History:  Diagnosis Date  . Gestational diabetes   . Hypertension    with pregnancy  . Idiopathic intracranial hypertension   . Seizures (HCC)    Seizures as a child with ?med  . Syncope    Past Surgical History:  Procedure Laterality Date  . CESAREAN SECTION N/A 02/25/2019   Procedure: CESAREAN SECTION;  Surgeon: Audrey Mustard, MD;  Location: [facility] ORS;  Service: Obstetrics;  Laterality: N/A;  . FOOT GANGLION EXCISION     Cyst removed from foot  . FOOT SURGERY     Family History  Problem Relation Age of Onset  . Asthma Father   . Seizures Sister   . Ovarian cancer Maternal Grandmother   . Cancer - Ovarian Maternal Grandmother   . Cancer Maternal Grandmother   . Diabetes Mother    Social History   Tobacco Use  . Smoking status: Former Smoker    Packs/day: 0.50    Types: Cigarettes    Quit date: 07/16/2018    Years since quitting: 1.1  . Smokeless tobacco: Never Used  . Tobacco comment: 3 per day  Vaping Use  . Vaping Use: Never used  Substance Use Topics  . Alcohol use: Not Currently    Comment: Last ETOH 03/12/18  . Drug use: Never   No current outpatient medications on file.  Current Facility-Administered Medications:  .   medroxyPROGESTERone (DEPO-PROVERA) injection 150 mg, 150 mg, Intramuscular, Once, Audrey Munoz, Harrel Lemon, MD Allergies: Patient has no known allergies.  Review of Systems  Constitutional: Negative for chills, fever and malaise/fatigue.  HENT: Negative for congestion, sinus pain and sore throat.   Eyes: Negative for blurred vision and pain.  Respiratory: Negative for cough and wheezing.   Cardiovascular: Negative for chest pain and leg swelling.  Gastrointestinal: Negative for abdominal pain, constipation, diarrhea, heartburn, nausea and vomiting.  Genitourinary: Negative for dysuria, frequency, hematuria and urgency.  Musculoskeletal: Negative for back pain, joint pain, myalgias and neck pain.  Skin: Negative for itching and rash.  Neurological: Negative for dizziness, tremors and weakness.  Endo/Heme/Allergies: Does not bruise/bleed easily.  Psychiatric/Behavioral: Negative for depression. The patient is not nervous/anxious and does not have insomnia.     Objective: BP 120/80   Ht 5\' 3"  (1.6 m)   Wt 175 lb (79.4 kg)   LMP 03/28/2019   BMI 31.00 kg/m   Filed Weights   08/22/19 0910  Weight: 175 lb (79.4 kg)   Body mass index is 31 kg/m. Physical Exam Constitutional:      General: She is not in acute distress.    Appearance: She is well-developed.  Genitourinary:     Pelvic exam was performed with patient supine.     Vagina, uterus and rectum  normal.     No lesions in the vagina.     No vaginal bleeding.     No cervical motion tenderness, friability, lesion or polyp.     Uterus is mobile.     Uterus is not enlarged.     No uterine mass detected.    Uterus is midaxial.     No right or left adnexal mass present.     Right adnexa not tender.     Left adnexa not tender.  HENT:     Head: Normocephalic and atraumatic. No laceration.     Right Ear: Hearing normal.     Left Ear: Hearing normal.     Mouth/Throat:     Pharynx: Uvula midline.  Eyes:     Pupils: Pupils are equal,  round, and reactive to light.  Neck:     Thyroid: No thyromegaly.  Cardiovascular:     Rate and Rhythm: Normal rate and regular rhythm.     Heart sounds: No murmur heard.  No friction rub. No gallop.   Pulmonary:     Effort: Pulmonary effort is normal. No respiratory distress.     Breath sounds: Normal breath sounds. No wheezing.  Chest:     Breasts:        Right: No mass, skin change or tenderness.        Left: No mass, skin change or tenderness.  Abdominal:     General: Bowel sounds are normal. There is no distension.     Palpations: Abdomen is soft.     Tenderness: There is no abdominal tenderness. There is no rebound.  Musculoskeletal:        General: Normal range of motion.     Cervical back: Normal range of motion and neck supple.  Neurological:     Mental Status: She is alert and oriented to person, place, and time.     Cranial Nerves: No cranial nerve deficit.  Skin:    General: Skin is warm and dry.  Psychiatric:        Judgment: Judgment normal.  Vitals reviewed.     Assessment:  ANNUAL EXAM 1. Screening for diabetes mellitus   2. Annual physical exam   3. Screening for cervical cancer   4. RLQ abdominal pain      Screening Plan:            1.  Cervical Screening-  Pap smear done today  2. Breast screening- Exam annually and mammogram>40 planned   3. Labs managed by PCP  4. Counseling for contraception: Desires to change to IUD, will schedule after [place]   5. RLQ abdominal pain H/o ovarian cysts, plan [place] and assessment for cyst as pain as been persistent - US PELVIC COMPLETE WITH TRANSVAGINAL; Future      F/U  Return in about 1 week (around 08/29/2019) for Follow up for GYN [place] and IUD and GLUCOLA (2 hours).  Audrey Major, MD, Audrey Munoz Ob/Gyn, [facility] 08/22/2019  9:53 AM

## 2019-08-23 NOTE — Telephone Encounter (Signed)
Noted. Will order to arrive by apt date/time. 

## 2019-08-25 LAB — CYTOLOGY - PAP: Diagnosis: NEGATIVE

## 2019-08-28 ENCOUNTER — Emergency Department: Payer: Medicaid Other

## 2019-08-28 ENCOUNTER — Encounter: Payer: Self-pay | Admitting: Emergency Medicine

## 2019-08-28 ENCOUNTER — Other Ambulatory Visit: Payer: Self-pay

## 2019-08-28 ENCOUNTER — Emergency Department
Admission: EM | Admit: 2019-08-28 | Discharge: 2019-08-28 | Disposition: A | Discharge status: Home / Self Care | Payer: Medicaid Other | Attending: Emergency Medicine | Admitting: Emergency Medicine

## 2019-08-28 DIAGNOSIS — R519 Headache, unspecified: Secondary | ICD-10-CM | POA: Diagnosis not present

## 2019-08-28 DIAGNOSIS — R112 Nausea with vomiting, unspecified: Secondary | ICD-10-CM

## 2019-08-28 DIAGNOSIS — Z87891 Personal history of nicotine dependence: Secondary | ICD-10-CM | POA: Insufficient documentation

## 2019-08-28 DIAGNOSIS — R0602 Shortness of breath: Secondary | ICD-10-CM | POA: Diagnosis not present

## 2019-08-28 DIAGNOSIS — H53149 Visual discomfort, unspecified: Secondary | ICD-10-CM | POA: Diagnosis not present

## 2019-08-28 DIAGNOSIS — R102 Pelvic and perineal pain: Secondary | ICD-10-CM

## 2019-08-28 DIAGNOSIS — R000 Tachycardia, unspecified: Secondary | ICD-10-CM | POA: Diagnosis not present

## 2019-08-28 DIAGNOSIS — R0600 Dyspnea, unspecified: Secondary | ICD-10-CM | POA: Insufficient documentation

## 2019-08-28 DIAGNOSIS — R6883 Chills (without fever): Secondary | ICD-10-CM | POA: Diagnosis not present

## 2019-08-28 DIAGNOSIS — R1031 Right lower quadrant pain: Secondary | ICD-10-CM | POA: Insufficient documentation

## 2019-08-28 DIAGNOSIS — Z20822 Contact with and (suspected) exposure to covid-19: Secondary | ICD-10-CM | POA: Diagnosis not present

## 2019-08-28 DIAGNOSIS — R197 Diarrhea, unspecified: Secondary | ICD-10-CM | POA: Diagnosis present

## 2019-08-28 LAB — CBC
HCT: 40.2 % (ref 36.0–46.0)
Hemoglobin: 12.8 g/dL (ref 12.0–15.0)
MCH: 25.4 pg — ABNORMAL LOW (ref 26.0–34.0)
MCHC: 31.8 g/dL (ref 30.0–36.0)
MCV: 79.8 fL — ABNORMAL LOW (ref 80.0–100.0)
Platelets: 355 10*3/uL (ref 150–400)
RBC: 5.04 MIL/uL (ref 3.87–5.11)
RDW: 14.7 % (ref 11.5–15.5)
WBC: 11.5 10*3/uL — ABNORMAL HIGH (ref 4.0–10.5)
nRBC: 0 % (ref 0.0–0.2)

## 2019-08-28 LAB — URINALYSIS, COMPLETE (UACMP) WITH MICROSCOPIC
Bacteria, UA: NONE SEEN
Bilirubin Urine: NEGATIVE
Glucose, UA: NEGATIVE mg/dL
Hgb urine dipstick: NEGATIVE
Ketones, ur: NEGATIVE mg/dL
Leukocytes,Ua: NEGATIVE
Nitrite: NEGATIVE
Protein, ur: NEGATIVE mg/dL
Specific Gravity, Urine: 1.026 (ref 1.005–1.030)
pH: 5 (ref 5.0–8.0)

## 2019-08-28 LAB — COMPREHENSIVE METABOLIC PANEL
ALT: 29 U/L (ref 0–44)
AST: 16 U/L (ref 15–41)
Albumin: 4.5 g/dL (ref 3.5–5.0)
Alkaline Phosphatase: 97 U/L (ref 38–126)
Anion gap: 13 (ref 5–15)
BUN: 12 mg/dL (ref 6–20)
CO2: 19 mmol/L — ABNORMAL LOW (ref 22–32)
Calcium: 8.9 mg/dL (ref 8.9–10.3)
Chloride: 105 mmol/L (ref 98–111)
Creatinine, Ser: 0.73 mg/dL (ref 0.44–1.00)
GFR calc Af Amer: >60 mL/min (ref >60)
GFR calc non Af Amer: >60 mL/min (ref >60)
Glucose, Bld: 102 mg/dL — ABNORMAL HIGH (ref 70–99)
Potassium: 4 mmol/L (ref 3.5–5.1)
Sodium: 137 mmol/L (ref 135–145)
Total Bilirubin: 0.8 mg/dL (ref 0.3–1.2)
Total Protein: 8.2 g/dL — ABNORMAL HIGH (ref 6.5–8.1)

## 2019-08-28 LAB — LIPASE, BLOOD: Lipase: 27 U/L (ref 11–51)

## 2019-08-28 LAB — SARS CORONAVIRUS 2 BY RT PCR (HOSPITAL ORDER, PERFORMED IN ~~LOC~~ HOSPITAL LAB): SARS Coronavirus 2: NEGATIVE

## 2019-08-28 MED ORDER — IOHEXOL 350 MG/ML SOLN
75.0000 mL | Freq: Once | INTRAVENOUS | Status: AC | PRN
  Administered 2019-08-28: 75 mL via INTRAVENOUS

## 2019-08-28 MED ORDER — LACTATED RINGERS IV BOLUS
1000.0000 mL | Freq: Once | INTRAVENOUS | Status: AC
  Administered 2019-08-28: 1000 mL via INTRAVENOUS

## 2019-08-28 MED ORDER — PREDNISONE 50 MG PO TABS: 50.0000 mg | ORAL_TABLET | Freq: Every day | ORAL | 0 refills | Status: DC

## 2019-08-28 MED ORDER — ONDANSETRON HCL 4 MG/2ML IJ SOLN
4.0000 mg | Freq: Once | INTRAMUSCULAR | Status: AC
  Administered 2019-08-28: 4 mg via INTRAVENOUS
  Filled 2019-08-28: qty 2

## 2019-08-28 MED ORDER — ACETAMINOPHEN 500 MG PO TABS
1000.0000 mg | ORAL_TABLET | Freq: Once | ORAL | Status: AC
  Administered 2019-08-28: 1000 mg via ORAL
  Filled 2019-08-28: qty 2

## 2019-08-28 MED ORDER — SODIUM CHLORIDE 0.9% FLUSH: 3.0000 mL | Freq: Once | INTRAVENOUS | Status: DC

## 2019-08-28 NOTE — ED Notes (Signed)
Audrey Munoz [phone] teleinterpreter used for DC.

## 2019-08-28 NOTE — ED Triage Notes (Signed)
Pt to ED via POV c/o N/V/D, headache and shortness of breath since yesterday. Pt is in NAD. Pt is able to speak in complete sentences at this time. Pt also states that she is having chills.

## 2019-08-28 NOTE — ED Notes (Signed)
Pt presents to ED w/ c/o HA, SOB, and NVD that started 2 nights ago and got worse last night. Pt states she hasn't had any fevers and no one has been sick in the house. Pt states she has received 2 covid vaccine shots. Tele-interpreter Eulis Foster [phone] assisted w/ assessment.

## 2019-08-28 NOTE — ED Notes (Signed)
Patient transported to Ultrasound 

## 2019-08-28 NOTE — ED Provider Notes (Signed)
[facility] Emergency Department Provider Note ____________________________________________   First MD Initiated Contact with Patient 08/28/19 1106     (approximate)  I have reviewed the triage vital signs and the nursing notes.  HISTORY  Chief Complaint Shortness of Breath, Emesis, Diarrhea, and Headache   HPI Audrey Munoz is a 24 y.o. female for evaluation of multiple complaints, as above.  Chart review indicates G3 P1-0-2-1 follows with OB/GYN.  Receives Depo-Provera injections.  Patient reports 2 days of right pelvic/RLQ abdominal pain.  Reports associated nonbloody nonbilious emesis up to 5 times per day and watery diarrhea.  Reports continued nausea.  Patient ports sensation of associated chills, but no documented fevers.  She reports that when the pain is severe, she has sensation of dyspnea and difficulty breathing.  Patient further reporting a bitemporal aching headache with associated photophobia.  She reports the RLQ pain is constant, aching and 6/10 intensity.  Patient denies syncope, substernal chest pain or pressure, vaginal bleeding or discharge.    Interpreter used for evaluations.  Patient later tells me upon my reevaluation, as described below within my timestamp section, that she has had pinching substernal chest pain associated with her shortness of breath.  Past Medical History:  Diagnosis Date  . Gestational diabetes   . Hypertension    with pregnancy  . Idiopathic intracranial hypertension   . Seizures (HCC)    Seizures as a child with ?med  . Syncope     Patient Active Problem List   Diagnosis Date Noted  . History of gestational diabetes 04/22/2019  . S/P cesarean section 03/03/2019  . Gestational diabetes 02/24/2019  . Diabetes mellitus affecting pregnancy in third trimester 01/20/2019  . Idiopathic intracranial hypertension 10/22/2018  . Supervision of high risk pregnancy, antepartum 08/05/2018  . Obesity (BMI  30.0-34.9) 08/05/2018  . Obesity affecting pregnancy in third trimester, antepartum 08/05/2018    Past Surgical History:  Procedure Laterality Date  . CESAREAN SECTION N/A 02/25/2019   Procedure: CESAREAN SECTION;  Surgeon: Nadara Mustard, MD;  Location: [facility] ORS;  Service: Obstetrics;  Laterality: N/A;  . FOOT GANGLION EXCISION     Cyst removed from foot  . FOOT SURGERY      Prior to Admission medications   Not on File    Allergies Patient has no known allergies.  Family History  Problem Relation Age of Onset  . Asthma Father   . Seizures Sister   . Ovarian cancer Maternal Grandmother   . Cancer - Ovarian Maternal Grandmother   . Cancer Maternal Grandmother   . Diabetes Mother     Social History Social History   Tobacco Use  . Smoking status: Former Smoker    Packs/day: 0.50    Types: Cigarettes    Quit date: 07/16/2018    Years since quitting: 1.1  . Smokeless tobacco: Never Used  . Tobacco comment: 3 per day  Vaping Use  . Vaping Use: Never used  Substance Use Topics  . Alcohol use: Not Currently    Comment: Last ETOH 03/12/18  . Drug use: Never    Review of Systems  Constitutional: No fever/chills Eyes: No visual changes. ENT: No sore throat. Cardiovascular: Denies chest pain. Respiratory: Denies shortness of breath. Gastrointestinal:  No constipation. Significant for abdominal pain, nausea, vomiting or diarrhea Genitourinary: Negative for dysuria. Musculoskeletal: Negative for back pain. Skin: Negative for rash. Neurological: Negative for headaches, focal weakness or numbness.   ____________________________________________   PHYSICAL EXAM:  VITAL SIGNS:  Vitals:   08/28/19 1130 08/28/19 1400  BP: 120/81 (!) 105/53  Pulse: 104 (!) 111  Resp: 17 19  Temp:    SpO2: 100% 100%      Constitutional: Alert and oriented.  Obese.  Sitting up in bed and conversational in full sentences via interpreter services. Eyes: Conjunctivae are normal.  PERRL. EOMI. Head: Atraumatic. Nose: No congestion/rhinnorhea. Mouth/Throat: Mucous membranes are moist.  Oropharynx non-erythematous. Neck: No stridor. No cervical spine tenderness to palpation. Cardiovascular: Normal rate, regular rhythm. Grossly normal heart sounds.  Good peripheral circulation. Respiratory: Normal respiratory effort.  No retractions. Lungs CTAB. Gastrointestinal: Soft , nondistended. No abdominal bruits. No CVA tenderness. Deep RLQ/right pelvic pain and tenderness to palpation.  No peritoneal features.  No upper or left-sided abdominal tenderness. Musculoskeletal: No lower extremity tenderness nor edema.  No joint effusions. No signs of acute trauma. Neurologic:  Normal speech and language. No gross focal neurologic deficits are appreciated. No gait instability noted. Skin:  Skin is warm, dry and intact. No rash noted. Psychiatric: Mood and affect are normal. Speech and behavior are normal.  ____________________________________________   LABS (all labs ordered are listed, but only abnormal results are displayed)  Labs Reviewed  COMPREHENSIVE METABOLIC PANEL - Abnormal; Notable for the following components:      Result Value   CO2 19 (*)    Glucose, Bld 102 (*)    Total Protein 8.2 (*)    All other components within normal limits  CBC - Abnormal; Notable for the following components:   WBC 11.5 (*)    MCV 79.8 (*)    MCH 25.4 (*)    All other components within normal limits  URINALYSIS, COMPLETE (UACMP) WITH MICROSCOPIC - Abnormal; Notable for the following components:   Color, Urine YELLOW (*)    APPearance CLOUDY (*)    All other components within normal limits  SARS CORONAVIRUS 2 BY RT PCR (HOSPITAL ORDER, PERFORMED IN [facility])  LIPASE, BLOOD  POC URINE PREG, ED   ____________________________________________  12 Lead EKG  Sinus rhythm, rate of 117 bpm, normal axis and intervals.  No evidence of acute  ischemia. ____________________________________________  RADIOLOGY  ED MD interpretation: CXR without evidence of acute cardiopulmonary pathology  Official radiology report(s): DG Chest 2 View  Result Date: 08/28/2019 CLINICAL DATA:  Acute onset of shortness of breath, headache, nausea, vomiting and diarrhea that began yesterday. EXAM: CHEST - 2 VIEW COMPARISON:  None. FINDINGS: Cardiomediastinal silhouette unremarkable. Lungs clear. Bronchovascular markings normal. Pulmonary vascularity normal. No pneumothorax. No pleural effusions. Visualized bony thorax intact. IMPRESSION: Normal examination. Electronically Signed   By: Hulan Saas M.D.   On: 08/28/2019 11:22   US PELVIC COMPLETE W TRANSVAGINAL AND TORSION R/O  Result Date: 08/28/2019 CLINICAL DATA:  RIGHT lower quadrant abdominal pain history ovarian cyst. EXAM: TRANSABDOMINAL AND TRANSVAGINAL ULTRASOUND OF PELVIS DOPPLER ULTRASOUND OF OVARIES TECHNIQUE: Both transabdominal and transvaginal ultrasound examinations of the pelvis were performed. Transabdominal technique was performed for global imaging of the pelvis including uterus, ovaries, adnexal regions, and pelvic cul-de-sac. It was necessary to proceed with endovaginal exam following the transabdominal exam to visualize the ovaries. Color and duplex Doppler ultrasound was utilized to evaluate blood flow to the ovaries. COMPARISON:  02/17/2019, May of 2019 FINDINGS: Uterus Measurements: 7.1 x 3.8 x 4.5 cm = volume: 65 mL. No fibroids or other mass visualized. Endometrium Thickness: 3 mm.  No focal abnormality visualized. Right ovary Measurements: 2.3 x 1.6 x 2.2 cm = volume:  4 mL. Normal appearance/no adnexal mass. Left ovary Measurements: 2.72.0 x 2.1 cm = volume: 6 mL. Normal appearance/no adnexal mass. Pulsed Doppler evaluation of both ovaries demonstrates normal low-resistance arterial and venous waveforms. Other findings No abnormal free fluid. IMPRESSION: 1. Normal pelvic sonogram  without signs of ovarian torsion. Electronically Signed   By: Donzetta Kohut M.D.   On: 08/28/2019 14:08    ____________________________________________   PROCEDURES and INTERVENTIONS  Procedure(s) performed (including Critical Care):  Procedures  Medications  sodium chloride flush (NS) 0.9 % injection 3 mL (3 mLs Intravenous Not Given 08/28/19 1452)  iohexol (OMNIPAQUE) 350 MG/ML injection 75 mL (has no administration in time range)  lactated ringers bolus 1,000 mL (0 mLs Intravenous Stopped 08/28/19 1410)  lactated ringers bolus 1,000 mL (1,000 mLs Intravenous New Bag/Given 08/28/19 1400)  ondansetron (ZOFRAN) injection 4 mg (4 mg Intravenous Given 08/28/19 1408)  acetaminophen (TYLENOL) tablet 1,000 mg (1,000 mg Oral Given 08/28/19 1406)    ____________________________________________   INITIAL IMPRESSION / ASSESSMENT AND PLAN / ED COURSE  24 year old Hispanic woman presenting with 2 days of various complaints, ultimately concerning for PE, and requiring CTA chest to help determine disposition.  Patient persistently in sinus tachycardia, otherwise hemodynamically stable and not hypoxic on room air.  Exam with uncomfortable-appearing dyspneic and tachypneic patient complaining of right-sided pelvic pain.  Due to her history of ovarian cysts, transvaginal ultrasound of her pelvis obtained and without evidence of cysts, torsion or hemorrhagic rupture.  After fluid resuscitation and Tylenol, she reports resolution of her abdominal symptoms.  Further reporting dyspnea and substernal sharp chest pain, in conjunction with her persistent tachycardia despite fluids and now tell me about chest pain, I am most concerned about the possibility of an acute PE.  Further concern is the possibility of delta or other variant of COVID-19 causing her sensation of dyspnea, headache and gastrointestinal constellation of symptoms.  COVID-19 PCR test sent and CTA chest ordered to evaluate for PE.  Patient signed out to  oncoming physician, Dr. Cyril Loosen, to follow-up on the results of the CTA chest.  If this is negative for acute PE, the patient would be stable for discharge home regardless of her COVID-19 test due to her lack of hypoxia or distress.  Prior to my leaving, I discussed outpatient management of COVID-19 with the patient including return precautions for the ED and home quarantine from her family, including her child.   Clinical Course as of Aug 28 1503  Wynelle Link Aug 28, 2019  1448 Reassessed.  Patient reports resolution of abdominal pain reports feeling better within her abdomen.  She reports continued shortness of breath, she is tachycardic to 110 and reports thoracic back pain with pinching chest pain.  She reports forgetting to tell me about her chest pain initially.  We discussed the possibility of pulmonary embolus,   [DS]  1448  she is agreeable to a CTA chest.   [DS]    Clinical Course User Index [DS] Delton Prairie, MD     ____________________________________________   FINAL CLINICAL IMPRESSION(S) / ED DIAGNOSES  Final diagnoses:  Pelvic pain  Shortness of breath  Sinus tachycardia  Nausea vomiting and diarrhea     ED Discharge Orders    None       Audrey Munoz Katrinka Blazing   Note:  This document was prepared using Dragon voice recognition software and may include unintentional dictation errors.   Delton Prairie, MD 08/28/19 [phone]

## 2019-08-28 NOTE — ED Notes (Signed)
ED Provider at bedside. 

## 2019-09-05 ENCOUNTER — Other Ambulatory Visit: Payer: Medicaid Other

## 2019-09-05 ENCOUNTER — Encounter: Payer: Self-pay | Admitting: Obstetrics & Gynecology

## 2019-09-05 ENCOUNTER — Other Ambulatory Visit: Payer: Self-pay | Admitting: Obstetrics & Gynecology

## 2019-09-05 ENCOUNTER — Ambulatory Visit (INDEPENDENT_AMBULATORY_CARE_PROVIDER_SITE_OTHER): Payer: Medicaid Other

## 2019-09-05 ENCOUNTER — Ambulatory Visit (INDEPENDENT_AMBULATORY_CARE_PROVIDER_SITE_OTHER): Payer: Medicaid Other | Admitting: Obstetrics & Gynecology

## 2019-09-05 ENCOUNTER — Other Ambulatory Visit: Payer: Self-pay

## 2019-09-05 VITALS — BP 120/80 | Ht 63.0 in | Wt 173.0 lb

## 2019-09-05 DIAGNOSIS — R1031 Right lower quadrant pain: Secondary | ICD-10-CM

## 2019-09-05 DIAGNOSIS — N920 Excessive and frequent menstruation with regular cycle: Secondary | ICD-10-CM

## 2019-09-05 DIAGNOSIS — Z30430 Encounter for insertion of intrauterine contraceptive device: Secondary | ICD-10-CM

## 2019-09-05 NOTE — Patient Instructions (Signed)
Colocacin de un dispositivo intrauterino, cuidados posteriores Intrauterine Device Insertion, Care After  Lea esta informacin sobre cmo cuidarse despus del procedimiento. El mdico tambin podr darle instrucciones ms especficas. Comunquese con su mdico si tiene problemas o preguntas. Qu puedo esperar despus del procedimiento? Despus del procedimiento, es comn [employer] siguientes sntomas:  Dolor y clicos abdominales.  Sangrado leve (manchado) o sangrado ms abundante, similar al perodo menstrual. Esto puede durar [employer].  Dolor en la parte inferior de la espalda.  Mareos.  Dolores de [place].  Nuseas. Siga estas indicaciones en su casa:  Antes de [employer] sexual nuevamente, revise la zona y asegrese de poder tocar los hilos del DIU. Debe poder tocar el extremo de los hilos por debajo de la abertura del cuello uterino. Si el hilo del DIU est en su lugar, puede retomar la actividad sexual. ? Si le colocaron un DIU hormonal despus de que hayan pasado 7das del inicio del perodo menstrual ms reciente, Sports coach un mtodo anticonceptivo adicional durante los 7das posteriores a Education officer, community del DIU. Pregntele al mdico si esto es vlido para su caso.  Siga controlando que el DIU est en su lugar; para ello, toque los hilos despus de cada perodo menstrual o una vez al mes.  Tome los medicamentos de venta libre y los recetados solamente como se lo haya indicado el mdico.  No conduzca ni use maquinaria pesada mientras toma analgsicos recetados.  Concurra a todas las visitas de control como se lo haya indicado el mdico. Esto es importante. Comunquese con un mdico si:  Tiene un sangrado ms abundante o dura ms de un ciclo menstrual normal.  Tiene fiebre.  Tiene clicos o un dolor abdominal que empeora o que no mejora con los medicamentos.  Siente un dolor abdominal nuevo o que no est en la misma zona en la que sinti antes los clicos  y Chief Technology Officer.  Se siente mareada o dbil.  Le sale una secrecin anormal o con mal olor de la vagina.  Siente dolor durante la actividad sexual.  Tiene cualquiera de los siguientes problemas con los hilos del DIU: ? El hilo le [place] o lo lastima a usted o a su pareja sexual. ? No puede tocar el hilo. ? El hilo se ha alargado.  Puede sentir el DIU en la vagina.  Cree que puede estar embarazada o no tiene su perodo menstrual.  Cree que puede tener una ITS (infeccin de transmisin sexual). Solicite ayuda de inmediato si:  Tiene sntomas similares a los de Emergency planning/management officer.  Siente escalofros o tiene fiebre.  Puede sentir que el DIU se ha salido de Environmental consultant. Resumen  Despus del procedimiento, es comn Surveyor, mining y clicos en el abdomen. Tambin es comn tener un sangrado leve (manchado) o un sangrado ms abundante, similar al perodo menstrual.  Siga controlando que el DIU est en su lugar; para ello, toque los hilos despus de cada perodo menstrual o una vez al mes.  Concurra a todas las visitas de control como se lo haya indicado el mdico. Esto es importante.  Comunquese con el mdico si tiene problemas con los hilos del DIU, por ejemplo, si el hilo se alarga o si le resulta molesto a usted o a su pareja sexual durante la actividad sexual. Esta informacin no tiene Theme park manager el consejo del mdico. Asegrese de hacerle al mdico cualquier pregunta que tenga. Document Revised: 10/16/2016 Document Reviewed: 10/16/2016 Elsevier Patient Education  2020 [employer].

## 2019-09-05 NOTE — Progress Notes (Signed)
HPI: Pt is a 24 yo who presents with heavy periods off of Depo but has been having pain despite spaced out periods on Depo.  She has monthly RLQ pain w radiation to back, mild to moderate, no associated sx's or modifiers.  Ultrasound demonstrates no masses seen These findings are normal  PMHx: She  has a past medical history of Gestational diabetes, Hypertension, Idiopathic intracranial hypertension, Seizures (HCC), and Syncope. Also,  has a past surgical history that includes Foot surgery; Foot ganglion excision; and Cesarean section (N/A, 02/25/2019)., family history includes Asthma in her father; Cancer in her maternal grandmother; Cancer - Ovarian in her maternal grandmother; Diabetes in her mother; Ovarian cancer in her maternal grandmother; Seizures in her sister.,  reports that she quit smoking about 13 months ago. Her smoking use included cigarettes. She smoked 0.50 packs per day. She has never used smokeless tobacco. She reports previous alcohol use. She reports that she does not use drugs.  She has a current medication list which includes the following prescription(s): prednisone, and the following Facility-Administered Medications: medroxyprogesterone. Also, has No Known Allergies.  Review of Systems  All other systems reviewed and are negative.   Objective: BP 120/80   Ht 5\' 3"  (1.6 m)   Wt 173 lb (78.5 kg)   LMP 09/05/2019   BMI 30.65 kg/m   Physical examination Constitutional NAD, Conversant  Skin No rashes, lesions or ulceration.   Extremities: Moves all appropriately.  Normal ROM for age. No lymphadenopathy.  Neuro: Grossly intact  Psych: Oriented to PPT.  Normal mood. Normal affect.       Pelvic: see IUD note below  11/05/2019 PELVIS TRANSVAGINAL NON-OB (TV ONLY)  Result Date: 09/05/2019 Patient Name: Audrey Munoz DOB: 01-Oct-1995 MRN: [id number] ULTRASOUND REPORT Location: [facility] Date of Service: 09/05/2019 Indications:Pelvic Pain Findings: The uterus is anteverted  and measures 7.1 x 4.3 x 3.6 cm. Echo texture is homogenous without evidence of focal masses. The Endometrium measures 3.3 mm. Right Ovary measures 3.3 x 3.4 x 2.0 cm. It is normal in appearance. Left Ovary measures 3.2 x 2.1 x 2.0 cm. It is normal in appearance. Survey of the adnexa demonstrates no adnexal masses. There is no free fluid in the cul de sac. Impression: 1. Normal pelvic ultrasound. Recommendations: 1.Clinical correlation with the patient's History and Physical Exam. 11/05/2019, RT Review of ULTRASOUND.    I have personally reviewed images and report of recent ultrasound done at [facility].    Plan of management to be discussed with patient. [facility], MD, FACOG [facility], [facility] 09/05/2019  10:30 AM   Assessment:  Menorrhagia with regular cycle Pelvic pain Encounter for IUD insertion  Plan IUD and transition off of Depo, then monitor cycles and pain.  Desires no hormones, thus Paraguard. No pregnancy desired  IUD PROCEDURE NOTE:  Audrey Munoz is a 24 y.o. [phone] here for IUD insertion. No GYN concerns.  Last pap smear was normal.  IUD Insertion Procedure Note Patient identified, informed consent performed, consent signed.   Discussed risks of irregular bleeding, cramping, infection, malpositioning or misplacement of the IUD outside the uterus which may require further procedure such as laparoscopy, risk of failure <1%. Time out was performed.  Urine pregnancy test negative.  A bimanual exam showed the uterus to be anteverted.  Speculum placed in the vagina.  Cervix visualized.  Cleaned with Betadine x 2.  Grasped anteriorly with a single tooth tenaculum.  Uterus sounded to 7  cm.   Paragard IUD placed per manufacturer's recommendations.  Strings trimmed to 3 cm. Tenaculum was removed, good hemostasis noted.  Patient tolerated procedure well.   Patient was given post-procedure instructions.  She was advised to have backup contraception for one  week.  Patient was also asked to check IUD strings periodically and follow up in 4 weeks for IUD check.  A total of 20 minutes were spent face-to-face with the patient as well as preparation, review, communication, and documentation during this encounter.   Annamarie Major, MD, [facility], [facility] 09/05/2019  10:44 AM

## 2019-09-06 LAB — GLUCOSE TOLERANCE, 2 HOURS
Glucose, 2 hour: 109 mg/dL (ref 65–139)
Glucose, GTT - Fasting: 93 mg/dL (ref 65–99)

## 2019-09-28 ENCOUNTER — Encounter: Payer: Self-pay | Admitting: Obstetrics and Gynecology

## 2019-10-10 ENCOUNTER — Ambulatory Visit: Payer: Medicaid Other

## 2019-10-11 ENCOUNTER — Ambulatory Visit: Payer: Medicaid Other | Admitting: Obstetrics & Gynecology

## 2020-03-09 ENCOUNTER — Other Ambulatory Visit: Payer: Self-pay

## 2020-03-09 ENCOUNTER — Encounter: Payer: Self-pay | Admitting: Obstetrics & Gynecology

## 2020-03-09 ENCOUNTER — Ambulatory Visit (INDEPENDENT_AMBULATORY_CARE_PROVIDER_SITE_OTHER): Payer: Medicaid Other | Admitting: Obstetrics & Gynecology

## 2020-03-09 VITALS — BP 120/80 | Ht 63.0 in | Wt 189.0 lb

## 2020-03-09 DIAGNOSIS — Z30011 Encounter for initial prescription of contraceptive pills: Secondary | ICD-10-CM

## 2020-03-09 DIAGNOSIS — Z30432 Encounter for removal of intrauterine contraceptive device: Secondary | ICD-10-CM | POA: Diagnosis not present

## 2020-03-09 MED ORDER — LO LOESTRIN FE 1 MG-10 MCG / 10 MCG PO TABS: 1.0000 | ORAL_TABLET | Freq: Every day | ORAL | 3 refills | Status: DC

## 2020-03-09 NOTE — Patient Instructions (Signed)
Norethindrone Acetate; Ethinyl Estradiol; Ferrous Fumarate Capsules or Tablets What is this medicine? NORETHINDRONE; ETHINYL ESTRADIOL; FERROUS FUMARATE (nor eth IN drone; ETH in il es tra DYE ole; FER us FUE ma rate) is an oral contraceptive. The products combine two types of female hormones, an estrogen and a progestin. These products prevent ovulation and pregnancy. This medicine may be used for other purposes; ask your health care provider or pharmacist if you have questions. COMMON BRAND NAME(S): Aurovela 24 Fe 1/20, Aurovela Fe, Blisovi 24 Fe, Blisovi Fe, Estrostep Fe, Gemmily, Gildess 24 Fe, Gildess Fe 1.5/30, Gildess Fe 1/20, Hailey 24 Fe, Hailey Fe 1.5/30, Junel Fe 1.5/30, Junel Fe 1/20, Junel Fe 24, Larin Fe, Lo Loestrin Fe, Loestrin 24 Fe, Loestrin FE 1.5/30, Loestrin FE 1/20, Lomedia 24 Fe, Merzee, Microgestin 24 Fe, Microgestin Fe 1.5/30, Microgestin Fe 1/20, Tarina 24 Fe, Tarina Fe 1/20, Taysofy, Taytulla, Tilia Fe, Tri-Legest Fe What should I tell my health care provider before I take this medicine? They need to know if you have any of these conditions: abnormal vaginal bleeding blood vessel disease or blood clots breast, cervical, endometrial, ovarian, liver, or uterine cancer diabetes gallbladder disease having surgery heart disease or recent heart attack high blood pressure high cholesterol or triglycerides history of irregular heartbeat or heart valve problems kidney disease liver disease migraine headaches protein C deficiency protein S deficiency recently had a baby, miscarriage, or abortion stroke systemic lupus erythematosus (SLE) tobacco smoker an unusual or allergic reaction to estrogens, progestins, other medicines, foods, dyes, or preservatives pregnant or trying to get pregnant breast-feeding How should I use this medicine? Take this medicine by mouth. To reduce nausea, this medicine may be taken with food. Follow the directions on the prescription label.  Take this medicine at the same time each day and in the order directed on the package. Do not take your medicine more often than directed. A patient package insert for the product will be given with each prescription and refill. Read this sheet carefully each time. The sheet may change frequently. Contact your pediatrician regarding the use of this medicine in children. Special care may be needed. This medicine has been used in female children who have started having menstrual periods. Overdosage: If you think you have taken too much of this medicine contact a poison control center or emergency room at once. NOTE: This medicine is only for you. Do not share this medicine with others. What if I miss a dose? If you miss a dose, refer to the patient information sheet you received with your medication for direction. If you miss more than one pill, this medication may not be as effective, and you may need to use another form of birth control. What may interact with this medicine? Do not take this medicine with the following medication: dasabuvir; ombitasvir; paritaprevir; ritonavir ombitasvir; paritaprevir; ritonavir This medicine may also interact with the following medications: acetaminophen antibiotics or medicines for infections, especially rifampin, rifabutin, rifapentine, and griseofulvin, and possibly penicillins or tetracyclines aprepitant ascorbic acid (vitamin C) atorvastatin barbiturate medicines, such as phenobarbital bosentan carbamazepine caffeine clofibrate cyclosporine dantrolene doxercalciferol felbamate grapefruit juice hydrocortisone medicines for anxiety or sleeping problems, such as diazepam or temazepam medicines for diabetes, including pioglitazone mineral oil modafinil mycophenolate nefazodone oxcarbazepine phenytoin prednisolone ritonavir or other medicines for HIV infection or AIDS rosuvastatin selegiline soy isoflavones supplements [facility]  wort tamoxifen or raloxifene theophylline thyroid hormones topiramate warfarin This list may not describe all possible interactions. Give your health care provider a   list of all the medicines, herbs, non-prescription drugs, or dietary supplements you use. Also tell them if you smoke, drink alcohol, or use illegal drugs. Some items may interact with your medicine. What should I watch for while using this medicine? Visit your doctor or health care professional for regular checks on your progress. You will need a regular breast and pelvic exam and Pap smear while on this medicine. Use an additional method of contraception during the first cycle that you take these tablets. If you have any reason to think you are pregnant, stop taking this medicine right away and contact your doctor or health care professional. If you are taking this medicine for hormone related problems, it may take several cycles of use to see improvement in your condition. Smoking increases the risk of getting a blood clot or having a stroke while you are taking birth control pills, especially if you are more than 27 years old. You are strongly advised not to smoke. This medicine can make your body retain fluid, making your fingers, hands, or ankles swell. Your blood pressure can go up. Contact your doctor or health care professional if you feel you are retaining fluid. This medicine can make you more sensitive to the sun. Keep out of the sun. If you cannot avoid being in the sun, wear protective clothing and use sunscreen. Do not use sun lamps or tanning beds/booths. If you wear contact lenses and notice visual changes, or if the lenses begin to feel uncomfortable, consult your eye care specialist. In some women, tenderness, swelling, or minor bleeding of the gums may occur. Notify your dentist if this happens. Brushing and flossing your teeth regularly may help limit this. See your dentist regularly and inform your dentist of the  medicines you are taking. If you are going to have elective surgery, you may need to stop taking this medicine before the surgery. Consult your health care professional for advice. This medicine does not protect you against HIV infection (AIDS) or any other sexually transmitted diseases. What side effects may I notice from receiving this medicine? Side effects that you should report to your doctor or health care professional as soon as possible: allergic reactions such as skin rash or itching, hives, swelling of the lips, mouth, tongue, or throat breast tissue changes or discharge dark patches of skin on your forehead, cheeks, upper lip, and chin depression high blood pressure migraines or severe, sudden headaches signs and symptoms of a blood clot such as breathing problems; changes in vision; chest pain; severe, sudden headache; pain, swelling, warmth in the leg; trouble speaking; sudden numbness or weakness of the face, arm or leg stomach pain symptoms of vaginal infection like itching, irritation or unusual discharge yellowing of the eyes or skin Side effects that usually do not require medical attention (report these to your doctor or health care professional if they continue or are bothersome): acne breast pain, tenderness irregular vaginal bleeding or spotting, particularly during the first month of use mild headache nausea weight gain (slight) This list may not describe all possible side effects. Call your doctor for medical advice about side effects. You may report side effects to FDA at 1-800-FDA-1088. Where should I keep my medicine? Keep out of the reach of children. Store at room temperature between 15 and 30 degrees C (59 and 86 degrees F). Throw away any unused medicine after the expiration date. NOTE: This sheet is a summary. It may not cover all possible information. If you have questions   about this medicine, talk to your doctor, pharmacist, or health care provider.  2021  Elsevier/Gold Standard (2019-12-05 12:27:45)  

## 2020-03-09 NOTE — Progress Notes (Addendum)
Contraception Counseling Patient is a 25 yo G3P1021 HF who presents for contraception counseling. The patient has complaints today of no period since [facility] polaced 6 mos ago, also pain at times; desires it out. The patient is sexually active. Pertinent past medical history: none.  PMHx: She  has a past medical history of Family history of ovarian cancer, Gestational diabetes, Hypertension, Idiopathic intracranial hypertension, Seizures (HCC), and Syncope. Also,  has a past surgical history that includes Foot surgery; Foot ganglion excision; and Cesarean section (N/A, 02/25/2019)., family history includes Asthma in her father; Cancer in her maternal grandmother; Cancer - Ovarian in her maternal grandmother; Diabetes in her mother; Ovarian cancer in her maternal grandmother; Seizures in her sister.,  reports that she quit smoking about 19 months ago. Her smoking use included cigarettes. She smoked 0.50 packs per day. She has never used smokeless tobacco. She reports previous alcohol use. She reports that she does not use drugs.  She has a current medication list which includes the following prescription(s): lo loestrin fe. Also, has No Known Allergies.  Review of Systems  Constitutional: Negative for chills, fever and malaise/fatigue.  HENT: Negative for congestion, sinus pain and sore throat.   Eyes: Negative for blurred vision and pain.  Respiratory: Negative for cough and wheezing.   Cardiovascular: Negative for chest pain and leg swelling.  Gastrointestinal: Negative for abdominal pain, constipation, diarrhea, heartburn, nausea and vomiting.  Genitourinary: Negative for dysuria, frequency, hematuria and urgency.  Musculoskeletal: Negative for back pain, joint pain, myalgias and neck pain.  Skin: Negative for itching and rash.  Neurological: Negative for dizziness, tremors and weakness.  Endo/Heme/Allergies: Does not bruise/bleed easily.  Psychiatric/Behavioral: Negative for depression. The  patient is not nervous/anxious and does not have insomnia.     Objective: BP 120/80   Ht 5\' 3"  (1.6 m)   Wt 189 lb (85.7 kg)   BMI 33.48 kg/m  Physical Exam Constitutional:      General: She is not in acute distress.    Appearance: She is well-developed.  Genitourinary:     Vagina and uterus normal.     No vaginal erythema or bleeding.      Right Adnexa: not tender and no mass present.    Left Adnexa: not tender and no mass present.    No cervical motion tenderness, discharge, polyp or nabothian cyst.     IUD strings visualized.     Uterus is mobile.     Uterus is not enlarged.     No uterine mass detected.    Uterus is midaxial.     Pelvic exam was performed with patient in the lithotomy position.  HENT:     Head: Normocephalic and atraumatic.     Nose: Nose normal.  Abdominal:     General: There is no distension.     Palpations: Abdomen is soft.     Tenderness: There is no abdominal tenderness.  Musculoskeletal:        General: Normal range of motion.  Neurological:     Mental Status: She is alert and oriented to person, place, and time.     Cranial Nerves: No cranial nerve deficit.  Skin:    General: Skin is warm and dry.  Psychiatric:        Attention and Perception: Attention normal.        Mood and Affect: Mood and affect normal.        Speech: Speech normal.        Behavior: Behavior  normal.        Thought Content: Thought content normal.        Judgment: Judgment normal.     ASSESSMENT/PLAN:    Problem List Items Addressed This Visit    Visit Diagnoses    Encounter for IUD removal    -  Primary   Encounter for initial prescription of contraceptive pills          IUD Removal Strings of IUD identified and grasped.  IUD removed without problem.  Pt tolerated this well.  IUD noted to be intact. IUD removed and plan for contraception is oral contraceptives (estrogen/progesterone). She was amenable to this plan. After 1-2 mos and w periods, can decide  on alternative form of contraception if she does not like pills   Annamarie Major, MD, [facility], [facility] 03/09/2020  3:55 PM

## 2020-05-15 ENCOUNTER — Encounter: Payer: Self-pay | Admitting: Obstetrics and Gynecology

## 2020-05-25 ENCOUNTER — Encounter: Payer: Self-pay | Admitting: Obstetrics & Gynecology

## 2020-05-25 ENCOUNTER — Other Ambulatory Visit: Payer: Self-pay

## 2020-05-25 ENCOUNTER — Ambulatory Visit (INDEPENDENT_AMBULATORY_CARE_PROVIDER_SITE_OTHER): Payer: Medicaid Other | Admitting: Obstetrics & Gynecology

## 2020-05-25 VITALS — BP 100/70 | Ht 63.0 in | Wt 182.0 lb

## 2020-05-25 DIAGNOSIS — N911 Secondary amenorrhea: Secondary | ICD-10-CM | POA: Diagnosis not present

## 2020-05-25 MED ORDER — MEDROXYPROGESTERONE ACETATE 10 MG PO TABS: 20.0000 mg | ORAL_TABLET | Freq: Every day | ORAL | 0 refills | Status: DC

## 2020-05-25 NOTE — Progress Notes (Signed)
  Amenorrhea Patient is a G1P1 HF who presents for evaluation of secondary amenorrhea. No period since Sept 2021.  This period was after her pregnancy, delibery 15 mos ago.  She used Paraguard IUD w no period, had that removed in Feb2022 and placed on OCP to see about having a cycle.  She took for one month but noted worsening headache and lower abd pains.  Stopped OCP and ha/pain went away.  Sees neurology in May for headaches to assess in general, feels she may have migraine disorder.  Prior PCOS and difficult fertility.  Does not want pregnancy now.  PMHx: She  has a past medical history of Family history of ovarian cancer, Gestational diabetes, Hypertension, Idiopathic intracranial hypertension, Seizures (HCC), and Syncope. Also,  has a past surgical history that includes Foot surgery; Foot ganglion excision; and Cesarean section (N/A, 02/25/2019)., family history includes Asthma in her father; Cancer in her maternal grandmother; Cancer - Ovarian in her maternal grandmother; Diabetes in her mother; Ovarian cancer in her maternal grandmother; Seizures in her sister.,  reports that she quit smoking about 22 months ago. Her smoking use included cigarettes. She smoked 0.50 packs per day. She has never used smokeless tobacco. She reports previous alcohol use. She reports that she does not use drugs.  She has a current medication list which includes the following prescription(s): medroxyprogesterone and lo loestrin fe. Also, has No Known Allergies.  Review of Systems  Constitutional: Negative for chills, fever and malaise/fatigue.  HENT: Negative for congestion, sinus pain and sore throat.   Eyes: Negative for blurred vision and pain.  Respiratory: Negative for cough and wheezing.   Cardiovascular: Negative for chest pain and leg swelling.  Gastrointestinal: Negative for abdominal pain, constipation, diarrhea, heartburn, nausea and vomiting.  Genitourinary: Negative for dysuria, frequency, hematuria and  urgency.  Musculoskeletal: Negative for back pain, joint pain, myalgias and neck pain.  Skin: Negative for itching and rash.  Neurological: Negative for dizziness, tremors and weakness.  Endo/Heme/Allergies: Does not bruise/bleed easily.  Psychiatric/Behavioral: Negative for depression. The patient is not nervous/anxious and does not have insomnia.     Objective: BP 100/70   Ht 5\' 3"  (1.6 m)   Wt 182 lb (82.6 kg)   BMI 32.24 kg/m  Physical Exam Constitutional:      General: She is not in acute distress.    Appearance: She is well-developed.  Musculoskeletal:        General: Normal range of motion.  Neurological:     Mental Status: She is alert and oriented to person, place, and time.  Skin:    General: Skin is warm and dry.  Vitals reviewed.     ASSESSMENT/PLAN:     ICD-10-CM   1. Amenorrhea, secondary  N91.1   Provera challenge Avoid estrogen for now (migraines, to assess w neurologist in late May Consider Depo or Nexplanon, even POP May re-consider estrogen based on neuro Low risk for adhesions or obstruction based on pregnancy and period postpartum Condoms for contraception advised currently  June, MD, [facility], [facility] 05/25/2020  11:16 AM

## 2020-11-21 NOTE — Telephone Encounter (Signed)
Paragard rcvd/charged 09/05/19

## 2021-01-03 ENCOUNTER — Ambulatory Visit (INDEPENDENT_AMBULATORY_CARE_PROVIDER_SITE_OTHER): Payer: Medicaid Other | Admitting: Licensed Practical Nurse

## 2021-01-03 ENCOUNTER — Other Ambulatory Visit: Payer: Self-pay

## 2021-01-03 ENCOUNTER — Ambulatory Visit (INDEPENDENT_AMBULATORY_CARE_PROVIDER_SITE_OTHER): Payer: Medicaid Other

## 2021-01-03 ENCOUNTER — Encounter: Payer: Self-pay | Admitting: Licensed Practical Nurse

## 2021-01-03 VITALS — BP 120/80 | Wt 189.0 lb

## 2021-01-03 DIAGNOSIS — Z113 Encounter for screening for infections with a predominantly sexual mode of transmission: Secondary | ICD-10-CM

## 2021-01-03 DIAGNOSIS — Z3A01 Less than 8 weeks gestation of pregnancy: Secondary | ICD-10-CM | POA: Diagnosis not present

## 2021-01-03 DIAGNOSIS — Z3491 Encounter for supervision of normal pregnancy, unspecified, first trimester: Secondary | ICD-10-CM

## 2021-01-03 DIAGNOSIS — O0990 Supervision of high risk pregnancy, unspecified, unspecified trimester: Secondary | ICD-10-CM | POA: Diagnosis not present

## 2021-01-03 LAB — POCT URINALYSIS DIPSTICK OB
Glucose, UA: NEGATIVE
POC,PROTEIN,UA: NEGATIVE

## 2021-01-03 LAB — POCT URINE PREGNANCY: Preg Test, Ur: POSITIVE — AB

## 2021-01-03 NOTE — Progress Notes (Signed)
New Obstetric Patient H&P    Chief Complaint: "Desires prenatal care" Spanish interpretor on Tablet for entire visit   History of Present Illness: Patient is a 25 y.o. G5P1021 Hispanic or Latino female, presents with amenorrhea and positive home pregnancy test. Patient's last menstrual period was 10/10/2020. and based on her  LMP, her EDD is Estimated Date of Delivery: 07/17/21 and her EGA is [redacted]w[redacted]d. However her physical exam and US done in the office today do not correlate with this EDD, The patient may be around [redacted] weeks gestation. Cycles are irregular and occur approximately every 4-6 months : lasting 3-4 days and described as light.Her last pap smear was 1 years ago and was no abnormalities.    Her last menstrual period was normal and lasted for  3 or 4 day(s). Since her LMP she claims she has experienced dizziness and fatigue. She denies vaginal bleeding. Her past medical history is contibutory for obesity and Migraines. Her prior pregnancies are notable for pre-eclampsia, gestational DM and requiring a LTCS for failure to progress.   Since her LMP, she admits to the use of tobacco products  no She claims she has gained    not sure  pounds since the start of her pregnancy.  There are cats in the home in the home  no, but they have 1 dog  She admits close contact with children on a regular basis  yes  She has had chicken pox in the past yes She has had Tuberculosis exposures, symptoms, or previously tested positive for TB   no Current or past history of domestic violence. no  Genetic Screening/Teratology Counseling: (Includes patient, baby's father, or anyone in either family with:)   1. Patient's age >/= 49 [facility]  no 2. Thalassemia ([place], [place], Mediterranean, or Asian background): MCV<80  no 3. Neural tube defect (meningomyelocele, spina bifida, anencephaly)  no 4. Congenital heart defect  no  5. Down syndrome  no 6. Tay-Sachs (Jewish, [place]  no 7. Canavan's Disease   no 8. Sickle cell disease or trait (African)  no  9. Hemophilia or other blood disorders  no  10. Muscular dystrophy  no  11. Cystic fibrosis  no  12. Huntington's Chorea  no  13. Mental retardation/autism  no 14. Other inherited genetic or chromosomal disorder  no 15. Maternal metabolic disorder (DM, PKU, etc)  GDM  16. Patient or FOB with a child with a birth defect not listed above no  16a. Patient or FOB with a birth defect themselves no 17. Recurrent pregnancy loss, or stillbirth  no  18. Any medications since LMP other than prenatal vitamins (include vitamins, supplements, OTC meds, drugs, alcohol)  yes 19. Any other genetic/environmental exposure to discuss  no  Infection History:   1. Lives with someone with TB or TB exposed  no  2. Patient or partner has history of genital herpes  no 3. Rash or viral illness since LMP  no 4. History of STI (GC, CT, HPV, syphilis, HIV)  no 5. History of recent travel :  no  Other pertinent information:  yes  Kerin Salen expresses concern about going through another pregnancy given that her last one had so many complications. Plus she and her partner are experiencing an economic hardship and she concerned for finances and is overwhelmed with small child at home.  This was not a planned pregnancy and she is uncertain if going through another pregnancy is the best for her.    Review of  Systems:10 point review of systems negative unless otherwise noted in HPI  Past Medical History:  Patient Active Problem List   Diagnosis Date Noted   Supervision of high risk pregnancy, antepartum 01/03/2021     Nursing Staff Provider  Office Location  [facility] Dating    Language  Spanish  Anatomy US    Flu Vaccine   Genetic Screen  NIPS:   TDaP vaccine    Hgb A1C or  GTT Early : Third trimester :   Covid    LAB RESULTS   Rhogam   Blood Type     Feeding Plan  Antibody    Contraception  Rubella    Circumcision  RPR     Pediatrician   HBsAg     Support  Person Caralyn [place]ile del [place] HIV    Prenatal Classes  Varicella     GBS  (For PCN allergy, check sensitivities)   BTL Consent     VBAC Consent  Pap      Hgb Electro    Pelvis Tested  CF      SMA             History of gestational diabetes 04/22/2019   S/P cesarean section 03/03/2019   Gestational diabetes 02/24/2019   Diabetes mellitus affecting pregnancy in third trimester 01/20/2019    On insulin 28 units    Idiopathic intracranial hypertension 10/22/2018   Obesity (BMI 30.0-34.9) 08/05/2018   Obesity affecting pregnancy in third trimester, antepartum 08/05/2018    BMI 30.3 MNT referral done 08/05/18 Aspirin 81mg  qd      Past Surgical History:  Past Surgical History:  Procedure Laterality Date   CESAREAN SECTION N/A 02/25/2019   Procedure: CESAREAN SECTION;  Surgeon: 02/27/2019, MD;  Location: [facility] ORS;  Service: Obstetrics;  Laterality: N/A;   FOOT GANGLION EXCISION     Cyst removed from foot   FOOT SURGERY      Gynecologic History: Patient's last menstrual period was 10/10/2020.  Obstetric History: 10/12/2020  Family History:  Family History  Problem Relation Age of Onset   Asthma Father    Seizures Sister    Ovarian cancer Maternal Grandmother    Cancer - Ovarian Maternal Grandmother    Cancer Maternal Grandmother    Diabetes Mother     Social History:  Social History   Socioeconomic History   Marital status: Single    Spouse name: Not on file   Number of children: Not on file   Years of education: 12   Highest education level: Not on file  Occupational History   Not on file  Tobacco Use   Smoking status: Former    Packs/day: 0.50    Types: Cigarettes    Quit date: 07/16/2018    Years since quitting: 2.4   Smokeless tobacco: Never   Tobacco comments:    3 per day  Vaping Use   Vaping Use: Never used  Substance and Sexual Activity   Alcohol use: Not Currently    Comment: Last ETOH 03/12/18   Drug use: Never   Sexual activity: Yes     Birth control/protection: Injection    Comment: 01/2018 last used  Other Topics Concern   Not on file  Social History Narrative   Not on file   Social Determinants of Health   Financial Resource Strain: Not on file  Food Insecurity: Not on file  Transportation Needs: Not on file  Physical Activity: Not on file  Stress: Not on  file  Social Connections: Not on file  Intimate Partner Violence: Not on file    Allergies:  No Known Allergies  Medications: Prior to Admission medications   Medication Sig Start Date End Date Taking? Authorizing Provider  rizatriptan (MAXALT) 5 MG tablet  06/21/20  Yes [provider]  medroxyPROGESTERone (PROVERA) 10 MG tablet Take 2 tablets (20 mg total) by mouth daily. For 10 days, then stop and watch for a period Patient not taking: Reported on 01/03/2021 05/25/20   Nadara Mustard, MD    Physical Exam Vitals: Blood pressure 120/80, weight 189 lb (85.7 kg), last menstrual period 10/10/2020, unknown if currently breastfeeding.  General: NAD HEENT: normocephalic, anicteric Thyroid: no enlargement, no palpable nodules Pulmonary: No increased work of breathing, CTAB Cardiovascular: RRR, distal pulses 2+ Abdomen: NABS, soft, non-tender, non-distended.  Umbilicus without lesions.  No hepatomegaly, splenomegaly or masses palpable. No evidence of hernia  Genitourinary:  External: Normal external female genitalia.  Normal urethral meatus, normal  Bartholin's and Skene's glands.    Vagina: Normal vaginal mucosa, no evidence of prolapse.    Cervix: Grossly normal in appearance, no bleeding  Uterus: 5-6 week sized Non-enlarged, mobile, normal contour.  No CMT  Adnexa: ovaries non-enlarged, no adnexal masses  Rectal: deferred Extremities: no edema, erythema, or tenderness Neurologic: Grossly intact Psychiatric: mood appropriate, affect full  [place]: Impression: 1.  Intrauterine Gestational Sac measures less than 5 weeks, therefore impossible to  determine viability by U/S. 2. (U/S) EDD is not consistent with Clinically established EDD of 07/17/21.    Assessment: 25 y.o. [id number] at [redacted]w[redacted]d presenting to initiate prenatal care  Plan: 1) Avoid alcoholic beverages. 2) Patient encouraged not to smoke.  3) Discontinue the use of all non-medicinal drugs and chemicals.  4) Take prenatal vitamins daily.  5) Nutrition, food safety (fish, cheese advisories, and high nitrite foods) and exercise discussed. 6) Hospital and practice style discussed with cross coverage system.  7) Genetic Screening, such as with 1st Trimester Screening, cell free fetal DNA, AFP testing, and Ultrasound, as well as with amniocentesis and CVS as appropriate, is discussed with patient. At the conclusion of today's visit patient requested genetic testing 8) Patient is asked about travel to areas at risk for the Zika virus, and counseled to avoid travel and exposure to mosquitoes or sexual partners who may have themselves been exposed to the virus. Testing is discussed, and will be ordered as appropriate.  9) Discussed that if she desires to keep this pregnancy we can get her in contact with a case worker to help her with resources, for the healthiest pregnancy recommend taking a PNV, starting baby ASA at 12 weeks, regular exercise and eating a healthy balanced diet. She may also choose to have a scheduled repeat c-section if she desires. The possibility of VBAC was also discussed.  10) Ibanez denies hx of depression, further screening needed at future appointments.  11) Return in 3-4 weeks for repeat US and OB labs  12) Gayland Curry, case Barista contacted, she will reach out to the pt for support.   Carie Caddy, CNM  [facility], [facility] 01/03/2021, 1:04 PM

## 2021-01-06 ENCOUNTER — Other Ambulatory Visit: Payer: Self-pay | Admitting: Obstetrics & Gynecology

## 2021-01-06 DIAGNOSIS — N911 Secondary amenorrhea: Secondary | ICD-10-CM

## 2021-01-06 LAB — URINE CULTURE

## 2021-01-07 ENCOUNTER — Other Ambulatory Visit: Payer: Self-pay | Admitting: Licensed Practical Nurse

## 2021-01-07 ENCOUNTER — Other Ambulatory Visit: Payer: Medicaid Other

## 2021-01-07 ENCOUNTER — Other Ambulatory Visit: Payer: Self-pay

## 2021-01-07 DIAGNOSIS — N911 Secondary amenorrhea: Secondary | ICD-10-CM

## 2021-01-08 ENCOUNTER — Other Ambulatory Visit: Payer: Self-pay | Admitting: Licensed Practical Nurse

## 2021-01-08 DIAGNOSIS — O200 Threatened abortion: Secondary | ICD-10-CM

## 2021-01-08 LAB — BETA HCG QUANT (REF LAB): hCG Quant: 6460 m[IU]/mL

## 2021-01-08 LAB — GC/CHLAMYDIA PROBE AMP
Chlamydia trachomatis, NAA: NEGATIVE
Neisseria Gonorrhoeae by PCR: NEGATIVE

## 2021-01-08 NOTE — Progress Notes (Signed)
Initial beta level.  Agree to have her follow up lab Wed and [place] next week if can get scheduled.

## 2021-01-09 ENCOUNTER — Other Ambulatory Visit: Payer: Medicaid Other

## 2021-01-09 ENCOUNTER — Other Ambulatory Visit: Payer: Self-pay

## 2021-01-09 DIAGNOSIS — O200 Threatened abortion: Secondary | ICD-10-CM

## 2021-01-10 ENCOUNTER — Ambulatory Visit (INDEPENDENT_AMBULATORY_CARE_PROVIDER_SITE_OTHER): Payer: Medicaid Other | Admitting: Obstetrics and Gynecology

## 2021-01-10 VITALS — BP 132/80 | Wt 190.0 lb

## 2021-01-10 DIAGNOSIS — Z3A01 Less than 8 weeks gestation of pregnancy: Secondary | ICD-10-CM

## 2021-01-10 DIAGNOSIS — Z3689 Encounter for other specified antenatal screening: Secondary | ICD-10-CM | POA: Diagnosis not present

## 2021-01-10 DIAGNOSIS — Z8632 Personal history of gestational diabetes: Secondary | ICD-10-CM

## 2021-01-10 DIAGNOSIS — O99213 Obesity complicating pregnancy, third trimester: Secondary | ICD-10-CM

## 2021-01-10 DIAGNOSIS — O0990 Supervision of high risk pregnancy, unspecified, unspecified trimester: Secondary | ICD-10-CM

## 2021-01-10 LAB — BETA HCG QUANT (REF LAB): hCG Quant: 8766 m[IU]/mL

## 2021-01-10 NOTE — Progress Notes (Signed)
Dating Scan  Singleton IUP CRL of [redacted]w[redacted]d or 0.5cm (0.52cm, 0.53cm, and 0.44cm) giving and ultrasound EDD of [redacted]w[redacted]d  FHT 115 BPM YS visualized 0.32cm, there is what appears to be a second much smaller possible yolk sac without a fetal pole ROV two simple cysts vs one larger cyst with a thick septation, no significant vascularity measuring 1.19 x 1.64cm and 1.06 x 1.93cm LOV normal No evidence of uterine fibroids No free fluid  There is a viable singleton gestation.  The fetal biometry does not correlate with established dating. Detailed evaluation of the fetal anatomy is precluded by early gestational age.  It must be noted that a normal ultrasound particular at this early gestational age is unable to rule out fetal aneuploidy, risk of first trimester miscarriage, or anatomic birth defects.  Vena [place], MD, [facility], [facility] 01/10/2021, 2:09 PM

## 2021-01-10 NOTE — Progress Notes (Signed)
° ° °  Routine Prenatal Care Visit  Subjective  Audrey Munoz is a 25 y.o. [phone] at [redacted]w[redacted]d being seen today for ongoing prenatal care.  She is currently monitored for the following issues for this high-risk pregnancy and has Obesity (BMI 30.0-34.9); Obesity affecting pregnancy in third trimester, antepartum; Idiopathic intracranial hypertension; Diabetes mellitus affecting pregnancy in third trimester; Gestational diabetes; S/P cesarean section; History of gestational diabetes; and Supervision of high risk pregnancy, antepartum on their problem list.  ----------------------------------------------------------------------------------- Patient reports no complaints.    .  .   . Denies leaking of fluid.  ----------------------------------------------------------------------------------- The following portions of the patient's history were reviewed and updated as appropriate: allergies, current medications, past family history, past medical history, past social history, past surgical history and problem list. Problem list updated.   Objective  Blood pressure 132/80, weight 190 lb (86.2 kg), last menstrual period 10/10/2020, unknown if currently breastfeeding. Pregravid weight 175 lb (79.4 kg) Total Weight Gain 15 lb (6.804 kg) Body mass index is 33.66 kg/m.  Urinalysis:      Fetal Status:           General:  Alert, oriented and cooperative. Patient is in no acute distress.  Skin: Skin is warm and dry. No rash noted.   Cardiovascular: Normal heart rate noted  Respiratory: Normal respiratory effort, no problems with respiration noted  Abdomen: Soft, gravid, appropriate for gestational age.       Pelvic:  Cervical exam deferred        Extremities: Normal range of motion.     ental Status: Normal mood and affect. Normal behavior. Normal judgment and thought content.     Assessment   25 y.o. [id number] at [redacted]w[redacted]d by  09/03/2021, by Ultrasound presenting for routine prenatal visit  Plan    pregnancy3 Problems (from 01/03/21 to present)     Problem Noted Resolved   Supervision of high risk pregnancy, antepartum 01/03/2021 by Ellwood Sayers, CNM No   Overview Signed 01/03/2021 12:58 PM by Ellwood Sayers, CNM     Nursing Staff Provider  Office Location  [facility] Dating    Language  Spanish  Anatomy US    Flu Vaccine   Genetic Screen  NIPS:   TDaP vaccine    Hgb A1C or  GTT Early : Third trimester :   Covid    LAB RESULTS   Rhogam   Blood Type     Feeding Plan  Antibody    Contraception  Rubella    Circumcision  RPR     Pediatrician   HBsAg     Support Person Caralyn [place]ile del [place] HIV    Prenatal Classes  Varicella     GBS  (For PCN allergy, check sensitivities)   BTL Consent     VBAC Consent  Pap      Hgb Electro    Pelvis Tested  CF      SMA                   Gestational age appropriate obstetric precautions including but not limited to vaginal bleeding, contractions, leaking of fluid and fetal movement were reviewed in detail with the patient.    S<D redated based on scan today  Return in about 4 weeks (around 02/07/2021) for ROB.  Vena [place], MD, [facility], [facility] 01/10/2021, 2:06 PM

## 2021-01-10 NOTE — Progress Notes (Signed)
ROB

## 2021-02-05 ENCOUNTER — Encounter: Payer: Self-pay | Admitting: Licensed Practical Nurse

## 2021-02-07 ENCOUNTER — Encounter: Payer: Medicaid Other | Admitting: Obstetrics and Gynecology

## 2021-02-08 ENCOUNTER — Ambulatory Visit: Admission: RE | Admit: 2021-02-08 | Payer: Medicaid Other | Source: Ambulatory Visit

## 2021-08-28 ENCOUNTER — Other Ambulatory Visit: Payer: Self-pay

## 2021-08-28 ENCOUNTER — Emergency Department: Payer: Medicaid Other

## 2021-08-28 ENCOUNTER — Emergency Department
Admission: EM | Admit: 2021-08-28 | Discharge: 2021-08-28 | Disposition: A | Discharge status: Home / Self Care | Payer: Medicaid Other | Attending: Emergency Medicine | Admitting: Emergency Medicine

## 2021-08-28 DIAGNOSIS — R110 Nausea: Secondary | ICD-10-CM | POA: Insufficient documentation

## 2021-08-28 DIAGNOSIS — R0789 Other chest pain: Secondary | ICD-10-CM | POA: Insufficient documentation

## 2021-08-28 DIAGNOSIS — Z20822 Contact with and (suspected) exposure to covid-19: Secondary | ICD-10-CM | POA: Diagnosis not present

## 2021-08-28 DIAGNOSIS — R0602 Shortness of breath: Secondary | ICD-10-CM | POA: Insufficient documentation

## 2021-08-28 LAB — BASIC METABOLIC PANEL
Anion gap: 4 — ABNORMAL LOW (ref 5–15)
BUN: 10 mg/dL (ref 6–20)
CO2: 25 mmol/L (ref 22–32)
Calcium: 8.9 mg/dL (ref 8.9–10.3)
Chloride: 109 mmol/L (ref 98–111)
Creatinine, Ser: 0.72 mg/dL (ref 0.44–1.00)
GFR, Estimated: >60 mL/min (ref >60)
Glucose, Bld: 95 mg/dL (ref 70–99)
Potassium: 3.9 mmol/L (ref 3.5–5.1)
Sodium: 138 mmol/L (ref 135–145)

## 2021-08-28 LAB — CBC
HCT: 42 % (ref 36.0–46.0)
Hemoglobin: 13.7 g/dL (ref 12.0–15.0)
MCH: 28.8 pg (ref 26.0–34.0)
MCHC: 32.6 g/dL (ref 30.0–36.0)
MCV: 88.4 fL (ref 80.0–100.0)
Platelets: 298 10*3/uL (ref 150–400)
RBC: 4.75 MIL/uL (ref 3.87–5.11)
RDW: 12.5 % (ref 11.5–15.5)
WBC: 10.2 10*3/uL (ref 4.0–10.5)
nRBC: 0 % (ref 0.0–0.2)

## 2021-08-28 LAB — TROPONIN I (HIGH SENSITIVITY): Troponin I (High Sensitivity): <2 ng/L (ref <18)

## 2021-08-28 LAB — RESP PANEL BY RT-PCR (FLU A&B, COVID) ARPGX2
Influenza A by PCR: NEGATIVE
Influenza B by PCR: NEGATIVE
SARS Coronavirus 2 by RT PCR: NEGATIVE

## 2021-08-28 MED ORDER — CYCLOBENZAPRINE HCL 5 MG PO TABS: 5.0000 mg | ORAL_TABLET | Freq: Three times a day (TID) | ORAL | 0 refills | Status: DC | PRN

## 2021-08-28 MED ORDER — NAPROXEN 500 MG PO TABS: 500.0000 mg | ORAL_TABLET | Freq: Two times a day (BID) | ORAL | 0 refills | Status: AC

## 2021-08-28 NOTE — ED Triage Notes (Signed)
Pt c/o chest tightness and SOB x6 days.  Pain score 8/10.

## 2021-08-28 NOTE — ED Provider Triage Note (Signed)
Emergency Medicine Provider Triage Evaluation Note  History limited by Spanish language.  Tele-interpreter used for triage  Audrey Munoz, a 26 y.o. female  was evaluated in triage.  Pt complains of central chest pain and shortness of breath since Thursday.  Patient reports some associated nausea today while at work.  Review of Systems  Positive: CP, SOB Negative: Cough, FCS  Physical Exam  There were no vitals taken for this visit. Gen:   Awake, no distress   Resp:  Normal effort CTA MSK:   Moves extremities without difficulty  Other:    Medical Decision Making  Medically screening exam initiated at 6:22 PM.  Appropriate orders placed.  Yetunde Leis was informed that the remainder of the evaluation will be completed by another provider, this initial triage assessment does not replace that evaluation, and the importance of remaining in the ED until their evaluation is complete.  Patient to the ED for evaluation of newly 1 week complaint of centralized chest pain with associated shortness of breath.   Lissa Hoard, PA-C 08/28/21 1823

## 2021-08-28 NOTE — Discharge Instructions (Addendum)
Your exam, labs, EKG, and chest XR are normal and reassuring. You do not appear to have any signs of a heart attack, lung infection, or lab abnormalities. Take the prescription meds as directed. Follow-up with [facility] or return if needed.  Su examen, laboratorios, electrocardiograma y radiografa de trax son normales y tranquilizadores. No parece tener ningn signo de ataque cardaco, infeccin pulmonar o anomalas de laboratorio. Tome los medicamentos recetados segn las indicaciones. Seguimiento con [facility] o regrese si es necesario.

## 2021-08-28 NOTE — ED Provider Notes (Signed)
[facility] Emergency Department Provider Note     Event Date/Time   First MD Initiated Contact with Patient 08/28/21 1944     (approximate)   History   Chest Pain and Shortness of Breath   HPI  History limited by Spanish language.  Tele-interpreter used for interview and exam.  Audrey Munoz is a 26 y.o. female complains of central chest pain and shortness of breath since Thursday.  Patient reports some associated nausea today while at work today.  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   Physical Exam   Triage Vital Signs: ED Triage Vitals [08/28/21 1823]  Enc Vitals Group     BP 121/62     Pulse Rate 74     Resp 18     Temp 97.8 F (36.6 C)     Temp Source Oral     SpO2 100 %     Weight 175 lb (79.4 kg)     Height 5\' 3"  (1.6 m)     Head Circumference      Peak Flow      Pain Score 8     Pain Loc      Pain Edu?      Excl. in GC?     Most recent vital signs: Vitals:   08/28/21 1823 08/28/21 2133  BP: 121/62 107/73  Pulse: 74 62  Resp: 18 15  Temp: 97.8 F (36.6 C) 98.8 F (37.1 C)  SpO2: 100% 98%    General Awake, no distress. NAD CV:  Good peripheral perfusion. RRR RESP:  Normal effort. CTA ABD:  No distention. Soft, nontender   ED Results / Procedures / Treatments   Labs (all labs ordered are listed, but only abnormal results are displayed) Labs Reviewed  BASIC METABOLIC PANEL - Abnormal; Notable for the following components:      Result Value   Anion gap 4 (*)    All other components within normal limits  RESP PANEL BY RT-PCR (FLU A&B, COVID) ARPGX2  CBC  TROPONIN I (HIGH SENSITIVITY)  TROPONIN I (HIGH SENSITIVITY)     EKG  Vent. rate 79 BPM PR interval 126 ms QRS duration 78 ms QT/QTcB 342/392 ms P-R-T axes 56 33 9  RADIOLOGY  I personally viewed and evaluated these images as part of my medical decision making, as well as reviewing the written  report by the radiologist.  ED Provider Interpretation: no acute findings}  DG Chest 2 View  Result Date: 08/28/2021 CLINICAL DATA:  Shortness of breath. Central chest pain since Thursday. Nausea today. EXAM: CHEST - 2 VIEW COMPARISON:  08/28/2019 FINDINGS: Normal heart size and pulmonary vascularity. No focal airspace disease or consolidation in the lungs. No blunting of costophrenic angles. No pneumothorax. Mediastinal contours appear intact. IMPRESSION: No active cardiopulmonary disease. Electronically Signed   By: 10/28/2019 M.D.   On: 08/28/2021 19:02     PROCEDURES:  Critical Care performed: No  Procedures   MEDICATIONS ORDERED IN ED: Medications - No data to display   IMPRESSION / MDM / ASSESSMENT AND PLAN / ED COURSE  I reviewed the triage vital signs and the nursing notes.                              Differential diagnosis includes, but is not limited to, ACS, aortic dissection, pulmonary embolism, cardiac tamponade, pneumothorax, pneumonia, pericarditis, myocarditis,  GI-related causes including esophagitis/gastritis, and musculoskeletal chest wall pain.     Patient's presentation is most consistent with acute complicated illness / injury requiring diagnostic workup.  Patient with a normal reassuring work-up at this time.  No evidence of any acute cardiac injury.  Low suspicion for ACS as symptoms have been persistent for almost a week.  No abnormalities of labs including a normal troponin and chemistry panel.  EKG without signs of malignant arrhythmia.  Chest x-ray does not reveal any acute intrathoracic process.  Given the patient's complaint of reproducible pain, patient's diagnosis is consistent with musculoskeletal chest wall pain. Patient will be discharged home with prescriptions for naproxen and Flexeril. Patient is to follow up with her primary provider as needed or otherwise directed. Patient is given ED precautions to return to the ED for any worsening or new  symptoms.     FINAL CLINICAL IMPRESSION(S) / ED DIAGNOSES   Final diagnoses:  Chest wall pain     Rx / DC Orders   ED Discharge Orders          Ordered    cyclobenzaprine (FLEXERIL) 5 MG tablet  3 times daily PRN        08/28/21 2109    naproxen (NAPROSYN) 500 MG tablet  2 times daily with meals        08/28/21 2109             Note:  This document was prepared using Dragon voice recognition software and may include unintentional dictation errors.    Lissa Hoard, PA-C 08/28/21 2220    Shaune Pollack, MD 09/03/21 [id number]

## 2021-11-26 ENCOUNTER — Other Ambulatory Visit: Payer: Self-pay

## 2021-11-26 ENCOUNTER — Observation Stay
Admission: EM | Admit: 2021-11-26 | Discharge: 2021-11-28 | Disposition: A | Discharge status: Home / Self Care | Payer: Medicaid Other | Attending: Obstetrics and Gynecology | Admitting: Obstetrics and Gynecology

## 2021-11-26 ENCOUNTER — Emergency Department: Payer: Medicaid Other

## 2021-11-26 ENCOUNTER — Encounter: Payer: Self-pay | Admitting: Emergency Medicine

## 2021-11-26 DIAGNOSIS — N83201 Unspecified ovarian cyst, right side: Principal | ICD-10-CM | POA: Insufficient documentation

## 2021-11-26 DIAGNOSIS — R1011 Right upper quadrant pain: Secondary | ICD-10-CM

## 2021-11-26 DIAGNOSIS — K661 Hemoperitoneum: Secondary | ICD-10-CM | POA: Diagnosis not present

## 2021-11-26 DIAGNOSIS — R109 Unspecified abdominal pain: Secondary | ICD-10-CM | POA: Insufficient documentation

## 2021-11-26 DIAGNOSIS — K353 Acute appendicitis with localized peritonitis, without perforation or gangrene: Secondary | ICD-10-CM | POA: Diagnosis not present

## 2021-11-26 DIAGNOSIS — K3580 Unspecified acute appendicitis: Secondary | ICD-10-CM

## 2021-11-26 DIAGNOSIS — R1032 Left lower quadrant pain: Secondary | ICD-10-CM

## 2021-11-26 DIAGNOSIS — N736 Female pelvic peritoneal adhesions (postinfective): Secondary | ICD-10-CM | POA: Diagnosis not present

## 2021-11-26 DIAGNOSIS — N83209 Unspecified ovarian cyst, unspecified side: Secondary | ICD-10-CM | POA: Diagnosis present

## 2021-11-26 LAB — WET PREP, GENITAL
Clue Cells Wet Prep HPF POC: NONE SEEN
Sperm: NONE SEEN
Trich, Wet Prep: NONE SEEN
WBC, Wet Prep HPF POC: <10 (ref <10)
Yeast Wet Prep HPF POC: NONE SEEN

## 2021-11-26 LAB — COMPREHENSIVE METABOLIC PANEL
ALT: 14 U/L (ref 0–44)
AST: 16 U/L (ref 15–41)
Albumin: 4 g/dL (ref 3.5–5.0)
Alkaline Phosphatase: 69 U/L (ref 38–126)
Anion gap: 8 (ref 5–15)
BUN: 9 mg/dL (ref 6–20)
CO2: 23 mmol/L (ref 22–32)
Calcium: 8.9 mg/dL (ref 8.9–10.3)
Chloride: 105 mmol/L (ref 98–111)
Creatinine, Ser: 0.54 mg/dL (ref 0.44–1.00)
GFR, Estimated: >60 mL/min (ref >60)
Glucose, Bld: 96 mg/dL (ref 70–99)
Potassium: 4.3 mmol/L (ref 3.5–5.1)
Sodium: 136 mmol/L (ref 135–145)
Total Bilirubin: 0.5 mg/dL (ref 0.3–1.2)
Total Protein: 7.6 g/dL (ref 6.5–8.1)

## 2021-11-26 LAB — URINALYSIS, ROUTINE W REFLEX MICROSCOPIC
Bilirubin Urine: NEGATIVE
Glucose, UA: NEGATIVE mg/dL
Hgb urine dipstick: NEGATIVE
Ketones, ur: NEGATIVE mg/dL
Leukocytes,Ua: NEGATIVE
Nitrite: NEGATIVE
Protein, ur: NEGATIVE mg/dL
Specific Gravity, Urine: 1.017 (ref 1.005–1.030)
pH: 7 (ref 5.0–8.0)

## 2021-11-26 LAB — CBC
HCT: 43.2 % (ref 36.0–46.0)
Hemoglobin: 14.2 g/dL (ref 12.0–15.0)
MCH: 29.5 pg (ref 26.0–34.0)
MCHC: 32.9 g/dL (ref 30.0–36.0)
MCV: 89.6 fL (ref 80.0–100.0)
Platelets: 284 10*3/uL (ref 150–400)
RBC: 4.82 MIL/uL (ref 3.87–5.11)
RDW: 12.4 % (ref 11.5–15.5)
WBC: 17.3 10*3/uL — ABNORMAL HIGH (ref 4.0–10.5)
nRBC: 0 % (ref 0.0–0.2)

## 2021-11-26 LAB — LIPASE, BLOOD: Lipase: 32 U/L (ref 11–51)

## 2021-11-26 LAB — CHLAMYDIA/NGC RT PCR (ARMC ONLY)
Chlamydia Tr: NOT DETECTED
N gonorrhoeae: NOT DETECTED

## 2021-11-26 LAB — PREGNANCY, URINE: Preg Test, Ur: NEGATIVE

## 2021-11-26 MED ORDER — MORPHINE SULFATE (PF) 4 MG/ML IV SOLN
4.0000 mg | Freq: Once | INTRAVENOUS | Status: AC
  Administered 2021-11-26: 4 mg via INTRAVENOUS
  Filled 2021-11-26: qty 1

## 2021-11-26 MED ORDER — MAGNESIUM HYDROXIDE 400 MG/5ML PO SUSP: 30.0000 mL | Freq: Every day | ORAL | Status: DC | PRN

## 2021-11-26 MED ORDER — ONDANSETRON HCL 4 MG PO TABS: 4.0000 mg | ORAL_TABLET | Freq: Four times a day (QID) | ORAL | Status: DC | PRN

## 2021-11-26 MED ORDER — SODIUM CHLORIDE 0.9 % IV BOLUS
1000.0000 mL | Freq: Once | INTRAVENOUS | Status: AC
  Administered 2021-11-26: 1000 mL via INTRAVENOUS

## 2021-11-26 MED ORDER — ALUM & MAG HYDROXIDE-SIMETH 200-200-20 MG/5ML PO SUSP: 30.0000 mL | ORAL | Status: DC | PRN

## 2021-11-26 MED ORDER — IBUPROFEN 600 MG PO TABS
600.0000 mg | ORAL_TABLET | Freq: Four times a day (QID) | ORAL | Status: DC | PRN
  Administered 2021-11-26 – 2021-11-28 (×3): 600 mg via ORAL
  Filled 2021-11-26 (×3): qty 1

## 2021-11-26 MED ORDER — MAGNESIUM CITRATE PO SOLN: 1.0000 | Freq: Once | ORAL | Status: DC | PRN

## 2021-11-26 MED ORDER — ONDANSETRON HCL 4 MG/2ML IJ SOLN
4.0000 mg | Freq: Once | INTRAMUSCULAR | Status: AC
  Administered 2021-11-26: 4 mg via INTRAVENOUS
  Filled 2021-11-26: qty 2

## 2021-11-26 MED ORDER — OXYCODONE-ACETAMINOPHEN 5-325 MG PO TABS
1.0000 | ORAL_TABLET | Freq: Once | ORAL | Status: AC
  Administered 2021-11-26: 1 via ORAL
  Filled 2021-11-26: qty 1

## 2021-11-26 MED ORDER — OXYCODONE-ACETAMINOPHEN 5-325 MG PO TABS
1.0000 | ORAL_TABLET | ORAL | Status: DC | PRN
  Administered 2021-11-26: 1 via ORAL
  Administered 2021-11-27 – 2021-11-28 (×2): 2 via ORAL
  Filled 2021-11-26 (×2): qty 2
  Filled 2021-11-26: qty 1

## 2021-11-26 MED ORDER — ONDANSETRON 4 MG PO TBDP
4.0000 mg | ORAL_TABLET | Freq: Once | ORAL | Status: AC
  Administered 2021-11-26: 4 mg via ORAL
  Filled 2021-11-26: qty 1

## 2021-11-26 MED ORDER — DOCUSATE SODIUM 100 MG PO CAPS
100.0000 mg | ORAL_CAPSULE | Freq: Two times a day (BID) | ORAL | Status: DC
  Administered 2021-11-26 – 2021-11-28 (×2): 100 mg via ORAL
  Filled 2021-11-26 (×2): qty 1

## 2021-11-26 MED ORDER — PIPERACILLIN-TAZOBACTAM 3.375 G IVPB
3.3750 g | Freq: Three times a day (TID) | INTRAVENOUS | Status: DC
  Administered 2021-11-26 – 2021-11-28 (×4): 3.375 g via INTRAVENOUS
  Filled 2021-11-26 (×6): qty 50

## 2021-11-26 MED ORDER — IOHEXOL 300 MG/ML  SOLN
100.0000 mL | Freq: Once | INTRAMUSCULAR | Status: AC | PRN
  Administered 2021-11-26: 100 mL via INTRAVENOUS

## 2021-11-26 MED ORDER — LACTATED RINGERS IV SOLN: INTRAVENOUS | Status: DC

## 2021-11-26 MED ORDER — ZOLPIDEM TARTRATE 5 MG PO TABS: 5.0000 mg | ORAL_TABLET | Freq: Every evening | ORAL | Status: DC | PRN

## 2021-11-26 MED ORDER — BISACODYL 5 MG PO TBEC: 5.0000 mg | DELAYED_RELEASE_TABLET | Freq: Every day | ORAL | Status: DC | PRN

## 2021-11-26 MED ORDER — HYDROMORPHONE HCL 1 MG/ML IJ SOLN
0.2000 mg | INTRAMUSCULAR | Status: DC | PRN
  Filled 2021-11-26 (×2): qty 0.5

## 2021-11-26 MED ORDER — ONDANSETRON HCL 4 MG/2ML IJ SOLN
4.0000 mg | Freq: Four times a day (QID) | INTRAMUSCULAR | Status: DC | PRN
  Administered 2021-11-27: 4 mg via INTRAVENOUS
  Filled 2021-11-26: qty 2

## 2021-11-26 NOTE — Consult Note (Addendum)
Reason for Consult: Abdominal pain x 2 days, right ovarian cyst with possible torsion, cannot r/o appendicitis Referring Physician: Acquanetta Belling, MD  Audrey Munoz is an 26 y.o. [phone] female who presented to the Emergency Room with complaints of abdominal pain with onset since yesterday, progressively worsening. Pain began in LLQ and slowly began radiating upward to left side.  Pain has been associated with nausea and vomiting.  Reports attempting a bowel movement to see if this would help the pain but did not.  Denies fevers, constipation, diarrhea, or urinary symptoms. She does endorse chills. Patient reports she last ate at 10:00 AM this morning, but vomited soon after.  Pain is described as colicky, sharp, and mostly remains on left side. Of note, patient reports similar episode x 2 occurring several months ago, where she noted left-sided pain for 1-2 days which eventually resolved without intervention.   CT scan noting some enlargement of appendicitis, with right ovarian complex mass ~ 5 cm.  Ultrasound with concerns of partial or intermittent torsion.   Pertinent Gynecological History: Menses: irregular occurring approximately every 30-60 days without intermenstrual spotting Contraception: none Sexually transmitted diseases: no past history Previous GYN Procedures:  None   Last pap: normal Date: 08/12/2019   Menstrual History: Menarche age: 1 Patient's last menstrual period was 10/07/2021.    OB History  Gravida Para Term Preterm AB Living  4 1 1  0 3 1  SAB IAB Ectopic Multiple Live Births  2 1 0 0 1    # Outcome Date GA Lbr Len/2nd Weight Sex Delivery Anes PTL Lv  4 SAB 02/05/21 [redacted]w[redacted]d         3 Term 02/25/19 [redacted]w[redacted]d / 05:26 3320 g M CS-LTranv EPI  LIV     Birth Comments: No anomalies noted at delivery  2 IAB           1 SAB             Past Medical History:  Diagnosis Date   Family history of ovarian cancer    8/21 cancer genetic testing letter sent   Gestational  diabetes    History of gestational diabetes 04/22/2019   Hypertension    with pregnancy   Idiopathic intracranial hypertension    Seizures ([place])    Seizures as a child with ?med   Syncope     Past Surgical History:  Procedure Laterality Date   CESAREAN SECTION N/A 02/25/2019   Procedure: CESAREAN SECTION;  Surgeon: Gae Dry, MD;  Location: [facility] ORS;  Service: Obstetrics;  Laterality: N/A;   FOOT GANGLION EXCISION     Cyst removed from foot   FOOT SURGERY      Family History  Problem Relation Age of Onset   Asthma Father    Seizures Sister    Ovarian cancer Maternal Grandmother    Cancer - Ovarian Maternal Grandmother    Cancer Maternal Grandmother    Diabetes Mother     Social History:  reports that she quit smoking about 3 years ago. Her smoking use included cigarettes. She smoked an average of .5 packs per day. She has never used smokeless tobacco. She reports that she does not currently use alcohol. She reports that she does not use drugs.  Allergies: No Known Allergies  Medications: Prior to Admission: (Not in a hospital admission)   Review of Systems  Constitutional:  Positive for chills. Negative for fever.  HENT: Negative.    Eyes: Negative.   Respiratory: Negative.  Cardiovascular: Negative.   Gastrointestinal:  Positive for abdominal pain, nausea and vomiting. Negative for diarrhea.  Genitourinary:  Positive for pelvic pain. Negative for dysuria, flank pain, frequency and urgency.  Musculoskeletal: Negative.   Neurological: Negative.   Psychiatric/Behavioral: Negative.      Blood pressure (!) 109/54, pulse 75, temperature 98 F (36.7 C), temperature source Oral, resp. rate 18, height 5\' 3"  (1.6 m), weight 79.4 kg, last menstrual period 10/07/2021, SpO2 99 %, unknown if currently breastfeeding. Physical Exam Constitutional:      General: She is not in acute distress.    Appearance: Normal appearance. She is not ill-appearing.     Comments: Mild  obesity  HENT:     Head: Normocephalic and atraumatic.  Eyes:     Extraocular Movements: Extraocular movements intact.     Conjunctiva/sclera: Conjunctivae normal.     Pupils: Pupils are equal, round, and reactive to light.  Cardiovascular:     Rate and Rhythm: Normal rate and regular rhythm.     Pulses: Normal pulses.     Heart sounds: Normal heart sounds.  Pulmonary:     Effort: Pulmonary effort is normal.     Breath sounds: Normal breath sounds.  Abdominal:     General: Abdomen is flat. There is no distension.     Palpations: Abdomen is soft.     Tenderness: There is abdominal tenderness. There is no guarding or rebound.     Comments: LLQ tenderness up to mid left abdomen.   Genitourinary:    Comments: Deferred Musculoskeletal:        General: Normal range of motion.  Skin:    General: Skin is warm and dry.  Neurological:     General: No focal deficit present.     Mental Status: She is alert and oriented to person, place, and time.  Psychiatric:        Mood and Affect: Mood normal.        Behavior: Behavior normal.        Thought Content: Thought content normal.     Results for orders placed or performed during the hospital encounter of 11/26/21 (from the past 48 hour(s))  Lipase, blood     Status: None   Collection Time: 11/26/21  1:17 PM  Result Value Ref Range   Lipase 32 11 - 51 U/L    Comment: Performed at [facility], [address] [place], [place] [address]  Comprehensive metabolic panel     Status: None   Collection Time: 11/26/21  1:17 PM  Result Value Ref Range   Sodium 136 135 - 145 mmol/L   Potassium 4.3 3.5 - 5.1 mmol/L    Comment: HEMOLYSIS AT THIS LEVEL MAY AFFECT RESULT   Chloride 105 98 - 111 mmol/L   CO2 23 22 - 32 mmol/L   Glucose, Bld 96 70 - 99 mg/dL    Comment: Glucose reference range applies only to samples taken after fasting for at least 8 hours.   BUN 9 6 - 20 mg/dL   Creatinine, Ser 0.54 0.44 - 1.00 mg/dL   Calcium 8.9  8.9 - 10.3 mg/dL   Total Protein 7.6 6.5 - 8.1 g/dL   Albumin 4.0 3.5 - 5.0 g/dL   AST 16 15 - 41 U/L   ALT 14 0 - 44 U/L   Alkaline Phosphatase 69 38 - 126 U/L   Total Bilirubin 0.5 0.3 - 1.2 mg/dL   GFR, Estimated >60 >60 mL/min    Comment: (  NOTE) Calculated using the CKD-EPI Creatinine Equation (2021)    Anion gap 8 5 - 15    Comment: Performed at [facility], [address] [place], [place] [address]  CBC     Status: Abnormal   Collection Time: 11/26/21  1:17 PM  Result Value Ref Range   WBC 17.3 (H) 4.0 - 10.5 K/uL   RBC 4.82 3.87 - 5.11 MIL/uL   Hemoglobin 14.2 12.0 - 15.0 g/dL   HCT 43.2 36.0 - 46.0 %   MCV 89.6 80.0 - 100.0 fL   MCH 29.5 26.0 - 34.0 pg   MCHC 32.9 30.0 - 36.0 g/dL   RDW 12.4 11.5 - 15.5 %   Platelets 284 150 - 400 K/uL   nRBC 0.0 0.0 - 0.2 %    Comment: Performed at [facility], [address] [place], [place] [address]  Urinalysis, Routine w reflex microscopic     Status: Abnormal   Collection Time: 11/26/21  1:17 PM  Result Value Ref Range   Color, Urine YELLOW (A) YELLOW   APPearance HAZY (A) CLEAR   Specific Gravity, Urine 1.017 1.005 - 1.030   pH 7.0 5.0 - 8.0   Glucose, UA NEGATIVE NEGATIVE mg/dL   Hgb urine dipstick NEGATIVE NEGATIVE   Bilirubin Urine NEGATIVE NEGATIVE   Ketones, ur NEGATIVE NEGATIVE mg/dL   Protein, ur NEGATIVE NEGATIVE mg/dL   Nitrite NEGATIVE NEGATIVE   Leukocytes,Ua NEGATIVE NEGATIVE    Comment: Performed at [facility], [address] [place], [place] [address]  Pregnancy, urine     Status: None   Collection Time: 11/26/21  1:17 PM  Result Value Ref Range   Preg Test, Ur NEGATIVE NEGATIVE    Comment: Performed at [facility], [address] [place], [place] [address]  [place] rt [facility] only)     Status: None   Collection Time: 11/26/21  5:49 PM   Specimen: Cervical/Vaginal swab; GU  Result Value Ref Range   Specimen source GC/Chlam ENDOCERVICAL     Chlamydia Tr NOT DETECTED NOT DETECTED   N gonorrhoeae NOT DETECTED NOT DETECTED    Comment: (NOTE) This CT/NG assay has not been evaluated in patients with a history of  hysterectomy. Performed at [facility], [address] [place], [place] [address]   Wet prep, genital     Status: None   Collection Time: 11/26/21  5:49 PM   Specimen: Cervical/Vaginal swab  Result Value Ref Range   Yeast Wet Prep HPF POC NONE SEEN NONE SEEN   Trich, Wet Prep NONE SEEN NONE SEEN   Clue Cells Wet Prep HPF POC NONE SEEN NONE SEEN   WBC, Wet Prep HPF POC <10 <10   Sperm NONE SEEN     Comment: Specimen diluted due to transport tube containing more than 1 ml of saline, interpret results with caution. Performed at [facility], [place], [place] [address]     US PELVIC COMPLETE W TRANSVAGINAL AND TORSION R/O  Result Date: 11/26/2021 CLINICAL DATA:  Right lower quadrant pain, ovarian cyst on CT. EXAM: TRANSABDOMINAL AND TRANSVAGINAL ULTRASOUND OF PELVIS DOPPLER ULTRASOUND OF OVARIES TECHNIQUE: Both transabdominal and transvaginal ultrasound examinations of the pelvis were performed. Transabdominal technique was performed for global imaging of the pelvis including uterus, ovaries, adnexal regions, and pelvic cul-de-sac. It was necessary to proceed with endovaginal exam following the transabdominal exam to visualize the ovaries, adnexa. Color and duplex Doppler ultrasound was utilized to evaluate  blood flow to the ovaries. COMPARISON:  Same day CT of the abdomen pelvis. FINDINGS: Uterus Measurements: 8.3 x 4.1 x 3.8 cm = volume: 66 mL. No fibroids or other mass visualized. Endometrium Thickness: 5 mm.  No focal abnormality visualized. Right ovary Measurements: 5.3 x 3.4 x 3.5 cm = volume: 33 mL. Complex cystic lesion in the right ovary favored to reflect a hemorrhagic cyst. Left ovary Measurements: 2.9 x 2.0 x 1.7 cm = volume: 5 mL. Normal appearance/no adnexal mass. Pulsed  Doppler evaluation of both ovaries demonstrates left ovary demonstrates a normal low-resistance arterial and venous waveforms there is some slight flattening of the right ovarian waveforms. Other findings Small volume pelvic free fluid. IMPRESSION: 1. Complex cystic lesion in the right ovary favored to reflect a hemorrhagic cyst. Enlargement of the right ovary with flattening of the ovarian waveforms, however the ovarian follicles are still present and while somewhat peripheral lies they are not compressed, no definite finding of ovarian torsion but the flattened waveforms are at least somewhat suspicious for partial torsion. Additionally, given the size of the ovary intermittent/partial torsion can not be excluded. Suggest Ob/gyn consult. 2. Small volume pelvic free fluid. Electronically Signed   By: Dahlia Bailiff M.D.   On: 11/26/2021 19:02   CT ABDOMEN PELVIS W CONTRAST  Result Date: 11/26/2021 CLINICAL DATA:  Mid RIGHT abdominal pain with nausea and vomiting, chills, leukocytosis, nonrevealing RIGHT upper quadrant ultrasound EXAM: CT ABDOMEN AND PELVIS WITH CONTRAST TECHNIQUE: Multidetector CT imaging of the abdomen and pelvis was performed using the standard protocol following bolus administration of intravenous contrast. RADIATION DOSE REDUCTION: This exam was performed according to the departmental dose-optimization program which includes automated exposure control, adjustment of the mA and/or kV according to patient size and/or use of iterative reconstruction technique. CONTRAST:  117mL OMNIPAQUE IOHEXOL 300 MG/ML SOLN IV. No oral contrast. COMPARISON:  None Available. FINDINGS: Lower chest: Lung bases clear Hepatobiliary: Gallbladder and liver normal appearance Pancreas: Normal appearance Spleen: Normal appearance Adrenals/Urinary Tract: Adrenal glands normal appearance. Kidneys, ureters, and bladder normal appearance. Stomach/Bowel: Appendix 7 mm diameter with minimal stranding of periappendiceal fat  distally. Proximal appendix normal caliber. Stomach and bowel loops otherwise normal appearance. Vascular/Lymphatic: Vascular structures patent. No adenopathy. Few scattered normal size mesenteric lymph nodes. Reproductive: Uterus and LEFT adnexa unremarkable. RIGHT ovarian/adnexal mass 4.9 x 3.7 x 4.1 cm with heterogeneous internal attenuation, increased dependently, suspect large hemorrhagic cyst. Other: Free fluid in pelvis, low attenuation. No free air. No hernia. Musculoskeletal: No acute osseous findings. IMPRESSION: RIGHT ovarian/adnexal mass 4.9 x 3.7 x 4.1 cm with heterogeneous internal attenuation, suspect large hemorrhagic cyst; consider pelvic sonography assessment. Small amount of free fluid in pelvis. Appendix borderline enlarged 7 mm diameter with minimal stranding of periappendiceal fat distally, could represent early acute appendicitis in the appropriate clinical setting, or the edema may be related to the free fluid in the pelvis; recommend correlation with physical exam and laboratory values. Remainder of exam unremarkable. Electronically Signed   By: Lavonia Dana M.D.   On: 11/26/2021 17:09   US Abdomen Limited RUQ (LIVER/GB)  Result Date: 11/26/2021 CLINICAL DATA:  Right upper quadrant pain EXAM: ULTRASOUND ABDOMEN LIMITED RIGHT UPPER QUADRANT COMPARISON:  None Available. FINDINGS: Gallbladder: No gallstones or wall thickening visualized. No sonographic Murphy sign noted by sonographer. Common bile duct: Diameter: 0.2 cm Liver: No focal lesion identified. Within normal limits in parenchymal echogenicity. Portal vein is patent on color Doppler imaging with normal direction of blood flow towards the  liver. Other: None. IMPRESSION: No sonographic finding to explain right upper quadrant pain Electronically Signed   By: Marin Roberts M.D.   On: 11/26/2021 15:42    Assessment/Plan: Right adnexal cyst - complex cyst present on right ovary, ~ 5 cm in size. Concerns for partial or intermittent  torsion.  Although current size of cyst not often a cause for torsion, other sonographic findings including flattening of ovarian wave forms, along with patient's acknowledgement of other episodes of similar pain in the past few months.  Discussed findings with patient. Advised on options of treatment with OCPs to see if cyst resolves, vs proceeding with surgical exploration with removal of cyst and possible detorsion.  In conjunction, concern for appendicitis vs local inflammatory reaction caused by cyst also present. Patient was simultaneously evaluated by General Surgery who offered options of expectant management with antibiotics vs surgical intervention with removal.  Risks and benefits of both options discussed. Patient desires expectant management for now. Will observe for 24 hours, if no improvement in symptoms, will proceed with surgical management. Patient does note issues with birth control in the past (has tried Depo Provera and an OCP, but cannot recall which one). Notes issues with feeling bloated, developing migraines. May consider oral progesterone-only OCP (although not as beneficial for treating cysts, may still aid with symptoms).  Appendicitis (vs inflammatory reaction) - see above #1.  Patient also seen and evaluated by General Surgery (see separate note). Will start Zosyn for treatment.    A total of 45 minutes were spent during this encounter, including review of previous progress notes, recent imaging and labs, face-to-face with time with patient involving counseling and coordination of care, as well as documentation for current visit.   Rubie Maid, [facility] 11/26/2021

## 2021-11-26 NOTE — Progress Notes (Signed)
Pharmacy Antibiotic Note  Audrey Munoz is a 26 y.o. female admitted on 11/26/2021 with possible appendicitis.  Pharmacy has been consulted for Zosyn dosing.  Plan: start Zosyn 3.375g IV q8h (4 hour infusion).  Height: 5\' 3"  (160 cm) Weight: 79.4 kg (175 lb 0.7 oz) IBW/kg (Calculated) : 52.4  Temp (24hrs), Avg:98.1 F (36.7 C), Min:98 F (36.7 C), Max:98.4 F (36.9 C)  Recent Labs  Lab 11/26/21 1317  WBC 17.3*  CREATININE 0.54    Estimated Creatinine Clearance: 104.5 mL/min (by C-G formula based on SCr of 0.54 mg/dL).    No Known Allergies  Antimicrobials this admission: 10/31 Zosyn >>   Thank you for allowing pharmacy to be a part of this patient's care.  Dallie Piles 11/26/2021 8:55 PM

## 2021-11-26 NOTE — ED Provider Notes (Addendum)
Patient signed out to me pending right upper quadrant ultrasound and reassessment.  26 year old female presenting with mid right abdomen pain nausea vomiting chills.  She is a leukocytosis of 17,000.  Right upper quadrant ultrasound is nonrevealing.  On reassessment patient continues to have pain.  She is tender in the right lower quadrant.  We will obtain a CT abdomen pelvis to rule out appendicitis or other pathology.  CT abdomen pelvis shows both a right ovarian cyst likely hemorrhagic with small amount of pelvic free fluid as well as enlargement the appendix and some periappendiceal fat stranding.  I performed a pelvic exam which overall is normal there is no significant discharge no adnexal or cervical motion tenderness.  Patient is still in quite a bit of discomfort.  I have ordered a pelvic ultrasound.  Have also reached out to general surgery Dr. Hampton Abbot who is currently in the OR.  We will likely discuss with GYN after ultrasound results as well.  Pelvic ultrasound shows complex ovarian cyst there is flow but the waveforms are flattened which could represent partial/intermittent torsion.  I consulted Dr. Marcelline Mates with OB/GYN.  Dr. Hampton Abbot still in the OR.  Anticipate she will need coordination with both services.  Dr. Hampton Abbot and Dr. Marcelline Mates have both seen and evaluated the patient.  She will be admitted to OB/GYN getting antibiotics.  No plan for surgery at this time.  Thank   Rada Hay, MD 11/26/21 1625     Rada Hay, MD 11/26/21 2042

## 2021-11-26 NOTE — ED Triage Notes (Signed)
Pt here with right side pain that started this morning with emesis and chills. Pt states the pain is constant.

## 2021-11-26 NOTE — ED Notes (Signed)
See triage note  Presents with some pain to right side of abd   Had had chills with some n/v/d  Unsure of fever but is currently afebrile

## 2021-11-26 NOTE — ED Provider Notes (Signed)
[facility] Provider Note    Event Date/Time   First MD Initiated Contact with Patient 11/26/21 1342     (approximate)  Spanish interpreter used for this evaluation  History   Chief Complaint: Emesis and Chills  HPI  Audrey Munoz is a 26 y.o. female with a past medical history of hypertension, presents to the emergency department for right upper quadrant abdominal pain.  According to the patient she woke this morning with what she describes as 10 out of 10 right upper quadrant abdominal pain.  Patient did not eat has not noted any association with food.  Patient did state the pain became so bad that she vomited x1.  Denies any diarrhea denies any dysuria or hematuria.  No history of kidney stones.  Patient's only prior abdominal surgery is a C-section.  Physical Exam   Triage Vital Signs: ED Triage Vitals  Enc Vitals Group     BP 11/26/21 1313 114/68     Pulse Rate 11/26/21 1313 95     Resp 11/26/21 1313 16     Temp 11/26/21 1313 98.4 F (36.9 C)     Temp Source 11/26/21 1313 Oral     SpO2 11/26/21 1313 97 %     Weight 11/26/21 1315 175 lb 0.7 oz (79.4 kg)     Height 11/26/21 1315 5\' 3"  (1.6 m)     Head Circumference --      Peak Flow --      Pain Score 11/26/21 1315 10     Pain Loc --      Pain Edu? --      Excl. in [place]? --     Most recent vital signs: Vitals:   11/26/21 1313  BP: 114/68  Pulse: 95  Resp: 16  Temp: 98.4 F (36.9 C)  SpO2: 97%    General: Awake, no distress.  CV:  Good peripheral perfusion.  Regular rate and rhythm  Resp:  Normal effort.  Equal breath sounds bilaterally.  Abd:  No distention.  Soft, moderate right upper quadrant tenderness to palpation.  No rebound or guarding.  Abdomen otherwise benign   ED Results / Procedures / Treatments   RADIOLOGY  Ultrasound pending   MEDICATIONS ORDERED IN ED: Medications  morphine (PF) 4 MG/ML injection 4 mg (has no administration in time range)  ondansetron  (ZOFRAN) injection 4 mg (has no administration in time range)  sodium chloride 0.9 % bolus 1,000 mL (has no administration in time range)  ondansetron (ZOFRAN-ODT) disintegrating tablet 4 mg (4 mg Oral Given 11/26/21 1324)  oxyCODONE-acetaminophen (PERCOCET/ROXICET) 5-325 MG per tablet 1 tablet (1 tablet Oral Given 11/26/21 1324)     IMPRESSION / MDM / ASSESSMENT AND PLAN / ED COURSE  I reviewed the triage vital signs and the nursing notes.  Patient's presentation is most consistent with acute presentation with potential threat to life or bodily function.  Patient presents emergency department for right upper quadrant abdominal pain that started this morning.  Currently states 9/10 aching type pain.  Patient appears mildly uncomfortable holding her right upper quadrant.  Patient states 1 episode of emesis at home no emesis in the emergency department.  Differential would include biliary colic, cholecystitis, gastritis, gastroenteritis or pancreatitis, less likely pyelonephritis or ureterolithiasis.  We will check labs including CBC chemistry urinalysis.  We will obtain a right upper quadrant ultrasound.  We will dose pain nausea medication IV hydrate while awaiting results.  Patient agreeable to plan of care.  Lab work shows moderate leukocytosis of 17,000 with otherwise reassuring CBC, chemistry is normal with normal LFTs, lipase normal.  Urinalysis is normal, pregnancy test negative.  Patient's right upper quadrant ultrasound is pending, if ultrasound is negative anticipate likely CT scan to further evaluate.  Patient care signed out to oncoming provider  FINAL CLINICAL IMPRESSION(S) / ED DIAGNOSES   Right upper quadrant abdominal pain   Note:  This document was prepared using Dragon voice recognition software and may include unintentional dictation errors.   Harvest Dark, MD 11/26/21 1456

## 2021-11-26 NOTE — Consult Note (Signed)
Date of Consultation:  11/26/2021  Requesting Physician:  Rada Hay, MD  Reason for Consultation:  Right sided abdominal pain  History of Present Illness: Rosia Syme is a 26 y.o. female presenting with right sided abdominal pain.  The patient reports the pain started last night that woke her up from sleep.  This over the course of the day has progressed and become worse.  The pain remains in the right side, about the middle of the abdomen/flank.  This was associated with nausea and then emesis.  Reports feeling chills at home.  Denies any other areas of pain.  Reports that she's had two prior milder episodes of this a few months ago.  She feels hungry and reports that her nausea has subsided with the medication she got in the ER.  In the ER, her workup showed a WBC of 17.3, with otherwise normal electrolytes and LFTs.  She had initially a RUQ ultrasound which did not show any gallstones or inflammatory changes.  This was followed by CT scan which showed possible early appendicitis with very mild changes in the distal appendix, but also a possible hemorrhagic right ovarian cyst.  She had a pelvic ultrasound which confirmed the hemorrhagic cyst but also showed decreased waveforms for the right ovary with possible partial torsion or intermittent torsion.  She reports her periods are irregular.  She has tried birth control in the past, but two different medications (injection and pill) gave her side effects.  Past Medical History: Past Medical History:  Diagnosis Date   Family history of ovarian cancer    8/21 cancer genetic testing letter sent   Gestational diabetes    History of gestational diabetes 04/22/2019   Hypertension    with pregnancy   Idiopathic intracranial hypertension    Seizures ([place])    Seizures as a child with ?med   Syncope      Past Surgical History: Past Surgical History:  Procedure Laterality Date   CESAREAN SECTION N/A 02/25/2019   Procedure:  CESAREAN SECTION;  Surgeon: Gae Dry, MD;  Location: [facility] ORS;  Service: Obstetrics;  Laterality: N/A;   FOOT GANGLION EXCISION     Cyst removed from foot   FOOT SURGERY      Home Medications: Prior to Admission medications   Medication Sig Start Date End Date Taking? Authorizing Provider  cyclobenzaprine (FLEXERIL) 5 MG tablet Take 1 tablet (5 mg total) by mouth 3 (three) times daily as needed. 08/28/21   Menshew, Dannielle Karvonen, PA-C    Allergies: No Known Allergies  Social History:  reports that she quit smoking about 3 years ago. Her smoking use included cigarettes. She smoked an average of .5 packs per day. She has never used smokeless tobacco. She reports that she does not currently use alcohol. She reports that she does not use drugs.   Family History: Family History  Problem Relation Age of Onset   Asthma Father    Seizures Sister    Ovarian cancer Maternal Grandmother    Cancer - Ovarian Maternal Grandmother    Cancer Maternal Grandmother    Diabetes Mother     Review of Systems: Review of Systems  Constitutional:  Positive for chills. Negative for fever.  HENT:  Negative for hearing loss.   Respiratory:  Negative for shortness of breath.   Cardiovascular:  Negative for chest pain.  Gastrointestinal:  Positive for abdominal pain, nausea and vomiting.  Genitourinary:  Negative for dysuria.  Musculoskeletal:  Negative for  myalgias.  Skin:  Negative for rash.  Neurological:  Negative for dizziness.  Psychiatric/Behavioral:  Negative for depression.     Physical Exam BP (!) 109/54 (BP Location: Left Arm)   Pulse 75   Temp 98 F (36.7 C) (Oral)   Resp 18   Ht 5\' 3"  (1.6 m)   Wt 79.4 kg   LMP 10/07/2021   SpO2 99%   BMI 31.01 kg/m  CONSTITUTIONAL: No acute distress HEENT:  Normocephalic, atraumatic, extraocular motion intact. NECK: Trachea is midline, and there is no jugular venous distension. RESPIRATORY:  Normal respiratory effort without  pathologic use of accessory muscles. CARDIOVASCULAR: Regular rhythm and rate. GI: The abdomen is soft, non-distended, with tenderness in the right abdomen, mostly localized to the mid portion of the right abdomen, with less degree of pain in upper and lower areas.  No diffuse peritonitis.   MUSCULOSKELETAL:  Normal muscle strength and tone in all four extremities.  No peripheral edema or cyanosis. SKIN: Skin turgor is normal. There are no pathologic skin lesions.  NEUROLOGIC:  Motor and sensation is grossly normal.  Cranial nerves are grossly intact. PSYCH:  Alert and oriented to person, place and time. Affect is normal.  Laboratory Analysis: Results for orders placed or performed during the hospital encounter of 11/26/21 (from the past 24 hour(s))  Lipase, blood     Status: None   Collection Time: 11/26/21  1:17 PM  Result Value Ref Range   Lipase 32 11 - 51 U/L  Comprehensive metabolic panel     Status: None   Collection Time: 11/26/21  1:17 PM  Result Value Ref Range   Sodium 136 135 - 145 mmol/L   Potassium 4.3 3.5 - 5.1 mmol/L   Chloride 105 98 - 111 mmol/L   CO2 23 22 - 32 mmol/L   Glucose, Bld 96 70 - 99 mg/dL   BUN 9 6 - 20 mg/dL   Creatinine, Ser 11/28/21 0.44 - 1.00 mg/dL   Calcium 8.9 8.9 - 3.29 mg/dL   Total Protein 7.6 6.5 - 8.1 g/dL   Albumin 4.0 3.5 - 5.0 g/dL   AST 16 15 - 41 U/L   ALT 14 0 - 44 U/L   Alkaline Phosphatase 69 38 - 126 U/L   Total Bilirubin 0.5 0.3 - 1.2 mg/dL   GFR, Estimated 51.8 >84 mL/min   Anion gap 8 5 - 15  CBC     Status: Abnormal   Collection Time: 11/26/21  1:17 PM  Result Value Ref Range   WBC 17.3 (H) 4.0 - 10.5 K/uL   RBC 4.82 3.87 - 5.11 MIL/uL   Hemoglobin 14.2 12.0 - 15.0 g/dL   HCT 11/28/21 60.6 - 30.1 %   MCV 89.6 80.0 - 100.0 fL   MCH 29.5 26.0 - 34.0 pg   MCHC 32.9 30.0 - 36.0 g/dL   RDW 60.1 09.3 - 23.5 %   Platelets 284 150 - 400 K/uL   nRBC 0.0 0.0 - 0.2 %  Urinalysis, Routine w reflex microscopic     Status: Abnormal    Collection Time: 11/26/21  1:17 PM  Result Value Ref Range   Color, Urine YELLOW (A) YELLOW   APPearance HAZY (A) CLEAR   Specific Gravity, Urine 1.017 1.005 - 1.030   pH 7.0 5.0 - 8.0   Glucose, UA NEGATIVE NEGATIVE mg/dL   Hgb urine dipstick NEGATIVE NEGATIVE   Bilirubin Urine NEGATIVE NEGATIVE   Ketones, ur NEGATIVE NEGATIVE mg/dL   Protein, ur  NEGATIVE NEGATIVE mg/dL   Nitrite NEGATIVE NEGATIVE   Leukocytes,Ua NEGATIVE NEGATIVE  Pregnancy, urine     Status: None   Collection Time: 11/26/21  1:17 PM  Result Value Ref Range   Preg Test, Ur NEGATIVE NEGATIVE  Chlamydia/NGC rt PCR ([facility] only)     Status: None   Collection Time: 11/26/21  5:49 PM   Specimen: Cervical/Vaginal swab; GU  Result Value Ref Range   Specimen source GC/Chlam ENDOCERVICAL    Chlamydia Tr NOT DETECTED NOT DETECTED   N gonorrhoeae NOT DETECTED NOT DETECTED  Wet prep, genital     Status: None   Collection Time: 11/26/21  5:49 PM   Specimen: Cervical/Vaginal swab  Result Value Ref Range   Yeast Wet Prep HPF POC NONE SEEN NONE SEEN   Trich, Wet Prep NONE SEEN NONE SEEN   Clue Cells Wet Prep HPF POC NONE SEEN NONE SEEN   WBC, Wet Prep HPF POC <10 <10   Sperm NONE SEEN     Imaging: US PELVIC COMPLETE W TRANSVAGINAL AND TORSION R/O  Result Date: 11/26/2021 CLINICAL DATA:  Right lower quadrant pain, ovarian cyst on CT. EXAM: TRANSABDOMINAL AND TRANSVAGINAL ULTRASOUND OF PELVIS DOPPLER ULTRASOUND OF OVARIES TECHNIQUE: Both transabdominal and transvaginal ultrasound examinations of the pelvis were performed. Transabdominal technique was performed for global imaging of the pelvis including uterus, ovaries, adnexal regions, and pelvic cul-de-sac. It was necessary to proceed with endovaginal exam following the transabdominal exam to visualize the ovaries, adnexa. Color and duplex Doppler ultrasound was utilized to evaluate blood flow to the ovaries. COMPARISON:  Same day CT of the abdomen pelvis. FINDINGS: Uterus  Measurements: 8.3 x 4.1 x 3.8 cm = volume: 66 mL. No fibroids or other mass visualized. Endometrium Thickness: 5 mm.  No focal abnormality visualized. Right ovary Measurements: 5.3 x 3.4 x 3.5 cm = volume: 33 mL. Complex cystic lesion in the right ovary favored to reflect a hemorrhagic cyst. Left ovary Measurements: 2.9 x 2.0 x 1.7 cm = volume: 5 mL. Normal appearance/no adnexal mass. Pulsed Doppler evaluation of both ovaries demonstrates left ovary demonstrates a normal low-resistance arterial and venous waveforms there is some slight flattening of the right ovarian waveforms. Other findings Small volume pelvic free fluid. IMPRESSION: 1. Complex cystic lesion in the right ovary favored to reflect a hemorrhagic cyst. Enlargement of the right ovary with flattening of the ovarian waveforms, however the ovarian follicles are still present and while somewhat peripheral lies they are not compressed, no definite finding of ovarian torsion but the flattened waveforms are at least somewhat suspicious for partial torsion. Additionally, given the size of the ovary intermittent/partial torsion can not be excluded. Suggest Ob/gyn consult. 2. Small volume pelvic free fluid. Electronically Signed   By: Maudry Mayhew M.D.   On: 11/26/2021 19:02   CT ABDOMEN PELVIS W CONTRAST  Result Date: 11/26/2021 CLINICAL DATA:  Mid RIGHT abdominal pain with nausea and vomiting, chills, leukocytosis, nonrevealing RIGHT upper quadrant ultrasound EXAM: CT ABDOMEN AND PELVIS WITH CONTRAST TECHNIQUE: Multidetector CT imaging of the abdomen and pelvis was performed using the standard protocol following bolus administration of intravenous contrast. RADIATION DOSE REDUCTION: This exam was performed according to the departmental dose-optimization program which includes automated exposure control, adjustment of the mA and/or kV according to patient size and/or use of iterative reconstruction technique. CONTRAST:  OMNIPAQUE IOHEXOL 300 MG/ML  SOLN IV. No oral contrast. COMPARISON:  None Available. FINDINGS: Lower chest: Lung bases clear Hepatobiliary: Gallbladder and liver normal  appearance Pancreas: Normal appearance Spleen: Normal appearance Adrenals/Urinary Tract: Adrenal glands normal appearance. Kidneys, ureters, and bladder normal appearance. Stomach/Bowel: Appendix 7 mm diameter with minimal stranding of periappendiceal fat distally. Proximal appendix normal caliber. Stomach and bowel loops otherwise normal appearance. Vascular/Lymphatic: Vascular structures patent. No adenopathy. Few scattered normal size mesenteric lymph nodes. Reproductive: Uterus and LEFT adnexa unremarkable. RIGHT ovarian/adnexal mass 4.9 x 3.7 x 4.1 cm with heterogeneous internal attenuation, increased dependently, suspect large hemorrhagic cyst. Other: Free fluid in pelvis, low attenuation. No free air. No hernia. Musculoskeletal: No acute osseous findings. IMPRESSION: RIGHT ovarian/adnexal mass 4.9 x 3.7 x 4.1 cm with heterogeneous internal attenuation, suspect large hemorrhagic cyst; consider pelvic sonography assessment. Small amount of free fluid in pelvis. Appendix borderline enlarged 7 mm diameter with minimal stranding of periappendiceal fat distally, could represent early acute appendicitis in the appropriate clinical setting, or the edema may be related to the free fluid in the pelvis; recommend correlation with physical exam and laboratory values. Remainder of exam unremarkable. Electronically Signed   By: Ulyses SouthwardMark  Boles M.D.   On: 11/26/2021 17:09   US Abdomen Limited RUQ (LIVER/GB)  Result Date: 11/26/2021 CLINICAL DATA:  Right upper quadrant pain EXAM: ULTRASOUND ABDOMEN LIMITED RIGHT UPPER QUADRANT COMPARISON:  None Available. FINDINGS: Gallbladder: No gallstones or wall thickening visualized. No sonographic Murphy sign noted by sonographer. Common bile duct: Diameter: 0.2 cm Liver: No focal lesion identified. Within normal limits in parenchymal  echogenicity. Portal vein is patent on color Doppler imaging with normal direction of blood flow towards the liver. Other: None. IMPRESSION: No sonographic finding to explain right upper quadrant pain Electronically Signed   By: Lorenza CambridgeHemant  Desai M.D.   On: 11/26/2021 15:42    Assessment and Plan: This is a 26 y.o. female with right sided abdominal pain.  --Dr. Valentino Saxonherry and I evaluated the patient simultaneously. There are some possible symptoms that could be related to appendicitis such as the progression and her pain location given that her appendix is retrocecal.  The pain was always in the right side and was never periumbilical.  Also, she's had milder episodes of the same location pain in the past.  Discussed with her also that typically patients with appendicitis are actually not hungry.  The partial torsion of her right ovary could cause the symptoms as well.  At this point, we both discussed with the patient about options and the possibility for observation vs surgery.  With observation, would observe over the next 24 hrs and would be starting her on antibiotics for possible appendicitis, and OCP for her hemorrhagic cyst.  If there is no improvement, then we would proceed to the OR for diagnostic laparoscopy.  The patient is in agreement with observation. --Patient will be admitted to Dr. Oretha Milchherry's service.  The patient is hungry and is asking if she can eat.  Discussed that she can have a diet for now but if any issues, would make her NPO. --If she does improve with the antibiotics, discussed with her that possibly her appendicitis could come back in the future.  There is an option for interval appendectomy and we can always discuss this in the future as outpatient.  Discussed that management of appendicitis with antibiotics is not against standard of care. --Will continue following with you.  I spent 60 minutes dedicated to the care of this patient on the date of this encounter to include pre-visit  review of records, face-to-face time with the patient discussing diagnosis and management, and any  post-visit coordination of care.   Howie Ill, MD [facility] Pg:  [phone]

## 2021-11-26 NOTE — ED Provider Triage Note (Signed)
  Emergency Medicine Provider Triage Evaluation Note  Audrey Munoz , a 26 y.o.female,  was evaluated in triage.  Pt complains of right upper quadrant abdominal pain x1 day.  She states that it started this morning.  Associated with emesis and chills.  She states that this has happened to her before, but was never told what was the cause of it.  Denies any other symptoms or recent injuries.   Review of Systems  Positive: Right upper quadrant abdominal pain. Negative: Denies fever, chest pain, vomiting  Physical Exam   Vitals:   11/26/21 1313  BP: 114/68  Pulse: 95  Resp: 16  Temp: 98.4 F (36.9 C)  SpO2: 97%   Gen:   Awake, appears uncomfortable. Resp:  Normal effort  MSK:   Moves extremities without difficulty  Other:  Pain with palpation in the right upper quadrant.  Medical Decision Making  Given the patient's initial medical screening exam, the following diagnostic evaluation has been ordered. The patient will be placed in the appropriate treatment space, once one is available, to complete the evaluation and treatment. I have discussed the plan of care with the patient and I have advised the patient that an ED physician or mid-level practitioner will reevaluate their condition after the test results have been received, as the results may give them additional insight into the type of treatment they may need.    Diagnostics: Labs, UA, right upper quadrant ultrasound.  Treatments: Ondansetron, oxycodone/acetaminophen.   Teodoro Spray, [place] 11/26/21 1322

## 2021-11-27 ENCOUNTER — Observation Stay: Payer: Medicaid Other | Admitting: Certified Registered Nurse Anesthetist

## 2021-11-27 ENCOUNTER — Observation Stay: Payer: Medicaid Other

## 2021-11-27 ENCOUNTER — Encounter
Admission: EM | Disposition: A | Discharge status: Home / Self Care | Payer: Self-pay | Source: Home / Self Care | Attending: Emergency Medicine

## 2021-11-27 DIAGNOSIS — N736 Female pelvic peritoneal adhesions (postinfective): Secondary | ICD-10-CM | POA: Diagnosis not present

## 2021-11-27 DIAGNOSIS — N83201 Unspecified ovarian cyst, right side: Secondary | ICD-10-CM | POA: Diagnosis not present

## 2021-11-27 DIAGNOSIS — K353 Acute appendicitis with localized peritonitis, without perforation or gangrene: Secondary | ICD-10-CM

## 2021-11-27 HISTORY — PX: LYSIS OF ADHESION: SHX5961

## 2021-11-27 HISTORY — PX: LAPAROSCOPIC OVARIAN CYSTECTOMY: SHX6248

## 2021-11-27 HISTORY — PX: LAPAROSCOPIC APPENDECTOMY: SHX408

## 2021-11-27 LAB — TYPE AND SCREEN
ABO/RH(D): O POS
Antibody Screen: NEGATIVE

## 2021-11-27 LAB — CBC WITH DIFFERENTIAL/PLATELET
Abs Immature Granulocytes: 0.02 10*3/uL (ref 0.00–0.07)
Basophils Absolute: 0 10*3/uL (ref 0.0–0.1)
Basophils Relative: 0 %
Eosinophils Absolute: 0.2 10*3/uL (ref 0.0–0.5)
Eosinophils Relative: 3 %
HCT: 34.5 % — ABNORMAL LOW (ref 36.0–46.0)
Hemoglobin: 11.4 g/dL — ABNORMAL LOW (ref 12.0–15.0)
Immature Granulocytes: 0 %
Lymphocytes Relative: 38 %
Lymphs Abs: 3.2 10*3/uL (ref 0.7–4.0)
MCH: 29.2 pg (ref 26.0–34.0)
MCHC: 33 g/dL (ref 30.0–36.0)
MCV: 88.5 fL (ref 80.0–100.0)
Monocytes Absolute: 0.7 10*3/uL (ref 0.1–1.0)
Monocytes Relative: 8 %
Neutro Abs: 4.3 10*3/uL (ref 1.7–7.7)
Neutrophils Relative %: 51 %
Platelets: 251 10*3/uL (ref 150–400)
RBC: 3.9 MIL/uL (ref 3.87–5.11)
RDW: 12.6 % (ref 11.5–15.5)
WBC: 8.5 10*3/uL (ref 4.0–10.5)
nRBC: 0 % (ref 0.0–0.2)

## 2021-11-27 LAB — CBC
HCT: 33.1 % — ABNORMAL LOW (ref 36.0–46.0)
Hemoglobin: 10.9 g/dL — ABNORMAL LOW (ref 12.0–15.0)
MCH: 29 pg (ref 26.0–34.0)
MCHC: 32.9 g/dL (ref 30.0–36.0)
MCV: 88 fL (ref 80.0–100.0)
Platelets: 236 10*3/uL (ref 150–400)
RBC: 3.76 MIL/uL — ABNORMAL LOW (ref 3.87–5.11)
RDW: 12.5 % (ref 11.5–15.5)
WBC: 8.3 10*3/uL (ref 4.0–10.5)
nRBC: 0 % (ref 0.0–0.2)

## 2021-11-27 SURGERY — APPENDECTOMY, LAPAROSCOPIC
Anesthesia: General | Site: Abdomen | Laterality: Right

## 2021-11-27 MED ORDER — FENTANYL CITRATE (PF) 100 MCG/2ML IJ SOLN
INTRAMUSCULAR | Status: AC
  Administered 2021-11-27: 25 ug via INTRAVENOUS
  Filled 2021-11-27: qty 2

## 2021-11-27 MED ORDER — BUPIVACAINE HCL (PF) 0.5 % IJ SOLN
INTRAMUSCULAR | Status: AC
  Filled 2021-11-27: qty 30

## 2021-11-27 MED ORDER — PROPOFOL 10 MG/ML IV BOLUS
INTRAVENOUS | Status: DC | PRN
  Administered 2021-11-27: 130 mg via INTRAVENOUS

## 2021-11-27 MED ORDER — ONDANSETRON HCL 4 MG/2ML IJ SOLN
INTRAMUSCULAR | Status: DC | PRN
  Administered 2021-11-27: 4 mg via INTRAVENOUS

## 2021-11-27 MED ORDER — HEMOSTATIC AGENTS (NO CHARGE) OPTIME
TOPICAL | Status: DC | PRN
  Administered 2021-11-27: 1 via TOPICAL

## 2021-11-27 MED ORDER — 0.9 % SODIUM CHLORIDE (POUR BTL) OPTIME
TOPICAL | Status: DC | PRN
  Administered 2021-11-27: 10 mL

## 2021-11-27 MED ORDER — DEXAMETHASONE SODIUM PHOSPHATE 10 MG/ML IJ SOLN
INTRAMUSCULAR | Status: DC | PRN
  Administered 2021-11-27: 10 mg via INTRAVENOUS

## 2021-11-27 MED ORDER — CELECOXIB 200 MG PO CAPS: 400.0000 mg | ORAL_CAPSULE | ORAL | Status: DC

## 2021-11-27 MED ORDER — FENTANYL CITRATE (PF) 100 MCG/2ML IJ SOLN
25.0000 ug | INTRAMUSCULAR | Status: DC | PRN
  Administered 2021-11-27: 25 ug via INTRAVENOUS

## 2021-11-27 MED ORDER — MIDAZOLAM HCL 2 MG/2ML IJ SOLN
INTRAMUSCULAR | Status: AC
  Filled 2021-11-27: qty 2

## 2021-11-27 MED ORDER — DEXMEDETOMIDINE HCL IN NACL 200 MCG/50ML IV SOLN
INTRAVENOUS | Status: DC | PRN
  Administered 2021-11-27: 8 ug via INTRAVENOUS
  Administered 2021-11-27: 12 ug via INTRAVENOUS

## 2021-11-27 MED ORDER — BUPIVACAINE-EPINEPHRINE (PF) 0.5% -1:200000 IJ SOLN
INTRAMUSCULAR | Status: DC | PRN
  Administered 2021-11-27: 30 mL

## 2021-11-27 MED ORDER — MIDAZOLAM HCL 2 MG/2ML IJ SOLN
INTRAMUSCULAR | Status: DC | PRN
  Administered 2021-11-27: 2 mg via INTRAVENOUS

## 2021-11-27 MED ORDER — LACTATED RINGERS IV BOLUS
500.0000 mL | Freq: Once | INTRAVENOUS | Status: AC
  Administered 2021-11-27: 500 mL via INTRAVENOUS

## 2021-11-27 MED ORDER — KETOROLAC TROMETHAMINE 30 MG/ML IJ SOLN
INTRAMUSCULAR | Status: AC
  Filled 2021-11-27: qty 1

## 2021-11-27 MED ORDER — CELECOXIB 200 MG PO CAPS
400.0000 mg | ORAL_CAPSULE | ORAL | Status: AC
  Administered 2021-11-27: 400 mg via ORAL
  Filled 2021-11-27: qty 2

## 2021-11-27 MED ORDER — SUGAMMADEX SODIUM 200 MG/2ML IV SOLN
INTRAVENOUS | Status: DC | PRN
  Administered 2021-11-27: 200 mg via INTRAVENOUS

## 2021-11-27 MED ORDER — ACETAMINOPHEN 500 MG PO TABS: 1000.0000 mg | ORAL_TABLET | ORAL | Status: DC

## 2021-11-27 MED ORDER — ACETAMINOPHEN 500 MG PO TABS
1000.0000 mg | ORAL_TABLET | ORAL | Status: AC
  Administered 2021-11-27: 1000 mg via ORAL
  Filled 2021-11-27: qty 2

## 2021-11-27 MED ORDER — ACETAMINOPHEN 10 MG/ML IV SOLN
INTRAVENOUS | Status: DC | PRN
  Administered 2021-11-27: 1000 mg via INTRAVENOUS

## 2021-11-27 MED ORDER — FENTANYL CITRATE (PF) 100 MCG/2ML IJ SOLN
INTRAMUSCULAR | Status: AC
  Filled 2021-11-27: qty 2

## 2021-11-27 MED ORDER — PROMETHAZINE HCL 25 MG/ML IJ SOLN: 6.2500 mg | INTRAMUSCULAR | Status: DC | PRN

## 2021-11-27 MED ORDER — LIDOCAINE HCL (CARDIAC) PF 100 MG/5ML IV SOSY
PREFILLED_SYRINGE | INTRAVENOUS | Status: DC | PRN
  Administered 2021-11-27: 100 mg via INTRAVENOUS

## 2021-11-27 MED ORDER — ROCURONIUM BROMIDE 10 MG/ML (PF) SYRINGE
PREFILLED_SYRINGE | INTRAVENOUS | Status: AC
  Filled 2021-11-27: qty 10

## 2021-11-27 MED ORDER — GABAPENTIN 300 MG PO CAPS: 300.0000 mg | ORAL_CAPSULE | ORAL | Status: DC

## 2021-11-27 MED ORDER — BUPIVACAINE-EPINEPHRINE (PF) 0.5% -1:200000 IJ SOLN
INTRAMUSCULAR | Status: AC
  Filled 2021-11-27: qty 30

## 2021-11-27 MED ORDER — LACTATED RINGERS IR SOLN
Status: DC | PRN
  Administered 2021-11-27: 300 mL

## 2021-11-27 MED ORDER — ROCURONIUM BROMIDE 100 MG/10ML IV SOLN
INTRAVENOUS | Status: DC | PRN
  Administered 2021-11-27: 40 mg via INTRAVENOUS
  Administered 2021-11-27: 20 mg via INTRAVENOUS

## 2021-11-27 MED ORDER — FENTANYL CITRATE (PF) 100 MCG/2ML IJ SOLN
INTRAMUSCULAR | Status: DC | PRN
  Administered 2021-11-27 (×2): 50 ug via INTRAVENOUS

## 2021-11-27 MED ORDER — SUCCINYLCHOLINE CHLORIDE 200 MG/10ML IV SOSY
PREFILLED_SYRINGE | INTRAVENOUS | Status: AC
  Filled 2021-11-27: qty 10

## 2021-11-27 MED ORDER — PIPERACILLIN-TAZOBACTAM 3.375 G IVPB
INTRAVENOUS | Status: AC
  Filled 2021-11-27: qty 50

## 2021-11-27 MED ORDER — GABAPENTIN 300 MG PO CAPS
300.0000 mg | ORAL_CAPSULE | ORAL | Status: AC
  Administered 2021-11-27: 300 mg via ORAL
  Filled 2021-11-27: qty 1

## 2021-11-27 MED ORDER — LACTATED RINGERS IV SOLN: INTRAVENOUS | Status: DC

## 2021-11-27 MED ORDER — CELECOXIB 200 MG PO CAPS
ORAL_CAPSULE | ORAL | Status: AC
  Filled 2021-11-27: qty 2

## 2021-11-27 MED ORDER — ACETAMINOPHEN 10 MG/ML IV SOLN
INTRAVENOUS | Status: AC
  Filled 2021-11-27: qty 100

## 2021-11-27 MED ORDER — SUCCINYLCHOLINE CHLORIDE 200 MG/10ML IV SOSY
PREFILLED_SYRINGE | INTRAVENOUS | Status: DC | PRN
  Administered 2021-11-27: 80 mg via INTRAVENOUS

## 2021-11-27 SURGICAL SUPPLY — 72 items
BLADE SURG SZ11 CARB STEEL (BLADE) ×4 IMPLANT
CATH ROBINSON RED A/P 16FR (CATHETERS) ×4 IMPLANT
CHLORAPREP W/TINT 26 (MISCELLANEOUS) ×4 IMPLANT
CORD MONOPOLAR M/FML 12FT (MISCELLANEOUS) IMPLANT
CUTTER FLEX LINEAR 45M (STAPLE) IMPLANT
DERMABOND ADVANCED .7 DNX12 (GAUZE/BANDAGES/DRESSINGS) ×8 IMPLANT
ELECT CAUTERY BLADE TIP 2.5 (TIP) ×4
ELECT REM PT RETURN 9FT ADLT (ELECTROSURGICAL) ×4
ELECTRODE CAUTERY BLDE TIP 2.5 (TIP) ×4 IMPLANT
ELECTRODE REM PT RTRN 9FT ADLT (ELECTROSURGICAL) ×4 IMPLANT
GAUZE 4X4 16PLY ~~LOC~~+RFID DBL (SPONGE) ×8 IMPLANT
GLOVE BIO SURGEON STRL SZ 6.5 (GLOVE) ×4 IMPLANT
GLOVE PI ORTHO PRO STRL 7.5 (GLOVE) ×4 IMPLANT
GLOVE SURG SYN 7.0 (GLOVE) ×16 IMPLANT
GLOVE SURG SYN 7.0 PF PI (GLOVE) ×4 IMPLANT
GLOVE SURG SYN 7.5  E (GLOVE) ×16
GLOVE SURG SYN 7.5 E (GLOVE) ×16 IMPLANT
GLOVE SURG SYN 7.5 PF PI (GLOVE) ×4 IMPLANT
GLOVE SURG UNDER LTX SZ7 (GLOVE) ×4 IMPLANT
GOWN STRL REUS W/ TWL LRG LVL3 (GOWN DISPOSABLE) ×20 IMPLANT
GOWN STRL REUS W/TWL LRG LVL3 (GOWN DISPOSABLE) ×12
GOWN STRL REUS W/TWL XL LVL4 (GOWN DISPOSABLE) ×4 IMPLANT
GRASPER SUT TROCAR 14GX15 (MISCELLANEOUS) IMPLANT
HEMOSTAT SURGICEL 2X3 (HEMOSTASIS) IMPLANT
IRRIGATION STRYKERFLOW (MISCELLANEOUS) ×4 IMPLANT
IRRIGATOR STRYKERFLOW (MISCELLANEOUS) ×4
IV LACTATED RINGERS 1000ML (IV SOLUTION) ×4 IMPLANT
IV NS 1000ML (IV SOLUTION)
IV NS 1000ML BAXH (IV SOLUTION) ×4 IMPLANT
KIT PINK PAD W/HEAD ARE REST (MISCELLANEOUS) ×4 IMPLANT
KIT PINK PAD W/HEAD ARM REST (MISCELLANEOUS) ×4 IMPLANT
KIT TURNOVER CYSTO (KITS) ×4 IMPLANT
KIT TURNOVER KIT A (KITS) ×4 IMPLANT
LABEL OR SOLS (LABEL) ×4 IMPLANT
LIGASURE LAP MARYLAND 5MM 37CM (ELECTROSURGICAL) IMPLANT
MANIFOLD NEPTUNE II (INSTRUMENTS) ×8 IMPLANT
NEEDLE HYPO 22GX1.5 SAFETY (NEEDLE) ×4 IMPLANT
NS IRRIG 500ML POUR BTL (IV SOLUTION) ×8 IMPLANT
PACK GYN LAPAROSCOPIC (MISCELLANEOUS) ×4 IMPLANT
PACK LAP CHOLECYSTECTOMY (MISCELLANEOUS) ×4 IMPLANT
PAD OB MATERNITY 4.3X12.25 (PERSONAL CARE ITEMS) ×4 IMPLANT
PAD PREP 24X41 OB/GYN DISP (PERSONAL CARE ITEMS) ×4 IMPLANT
POUCH ENDO CATCH 10MM SPEC (MISCELLANEOUS) IMPLANT
RELOAD 45 VASCULAR/THIN (ENDOMECHANICALS) IMPLANT
RELOAD STAPLE 45 2.5 WHT GRN (ENDOMECHANICALS) IMPLANT
RELOAD STAPLE 45 3.5 BLU ETS (ENDOMECHANICALS) ×4 IMPLANT
RELOAD STAPLE TA45 3.5 REG BLU (ENDOMECHANICALS) ×4 IMPLANT
SCISSORS METZENBAUM CVD 33 (INSTRUMENTS) ×4 IMPLANT
SCRUB CHG 4% DYNA-HEX 4OZ (MISCELLANEOUS) ×4 IMPLANT
SET TUBE SMOKE EVAC HIGH FLOW (TUBING) ×4 IMPLANT
SHEARS HARMONIC ACE PLUS 36CM (ENDOMECHANICALS) IMPLANT
SLEEVE ADV FIXATION 5X100MM (TROCAR) ×4 IMPLANT
SLEEVE Z-THREAD 5X100MM (TROCAR) ×4 IMPLANT
SUT MNCRL 4-0 (SUTURE) ×4
SUT MNCRL 4-0 27XMFL (SUTURE) ×4
SUT VIC AB 3-0 SH 27 (SUTURE)
SUT VIC AB 3-0 SH 27X BRD (SUTURE) IMPLANT
SUT VIC AB 4-0 FS2 27 (SUTURE) ×4 IMPLANT
SUT VICRYL 0 AB UR-6 (SUTURE) ×8 IMPLANT
SUTURE MNCRL 4-0 27XMF (SUTURE) ×4 IMPLANT
SYS BAG RETRIEVAL 10MM (BASKET) ×4
SYS KII FIOS ACCESS ABD 5X100 (TROCAR) ×4
SYSTEM BAG RETRIEVAL 10MM (BASKET) ×4 IMPLANT
SYSTEM KII FIOS ACES ABD 5X100 (TROCAR) ×4 IMPLANT
TRAP FLUID SMOKE EVACUATOR (MISCELLANEOUS) ×8 IMPLANT
TRAY FOLEY MTR SLVR 16FR STAT (SET/KITS/TRAYS/PACK) ×4 IMPLANT
TROCAR BALLN GELPORT 12X130M (ENDOMECHANICALS) ×4 IMPLANT
TROCAR XCEL NON-BLD 5MMX100MML (ENDOMECHANICALS) ×4 IMPLANT
TROCAR XCEL UNIV SLVE 11M 100M (ENDOMECHANICALS) IMPLANT
TROCAR Z-THRD FIOS HNDL 11X100 (TROCAR) ×4 IMPLANT
TUBING EVAC SMOKE HEATED PNEUM (TUBING) ×4 IMPLANT
WATER STERILE IRR 500ML POUR (IV SOLUTION) ×8 IMPLANT

## 2021-11-27 NOTE — Progress Notes (Addendum)
Hospital Day # 1, admitted for colicky LLQ pain, cannot r/o appendicitis, right complex ovarian cyst with possible intermittent torsion.    Language Line Interpreter Donald Pore [phone] used for Spanish interpretation  Subjective: Patient reports pain since earlier this morning. Around 5 AM notes pain was severe and had difficulty ambulating as she felt like she was going to pass out.  Pain has improved some with pain medication  but still notable. Denies any further episodes of nausea/vomiting since admission. Denies fevers or chills.   Of note, patient's urine output noted to be decreased over night for last 4 hours, 25 cc/hr. Given IVF bolus this morning.   Objective: Temp:  [97.7 F (36.5 C)-98.5 F (36.9 C)] 97.8 F (36.6 C) (11/01 0308) Pulse Rate:  [54-95] 57 (11/01 0308) Resp:  [13-18] 18 (11/01 0308) BP: (92-120)/(47-70) 92/52 (11/01 0308) SpO2:  [97 %-100 %] 100 % (11/01 0308) Weight:  [79.4 kg] 79.4 kg (10/31 1315)  I/O last 3 completed shifts: In: 1250 [P.O.:250; IV Piggyback:1000] Out: 200 [Urine:200] No intake/output data recorded.   Physical Exam:  General: alert and no distress  Lungs: clear to auscultation bilaterally Heart: regular rate and rhythm, S1, S2 normal, no murmur, click, rub or gallop Abdomen:  soft, moderately tender to palpation but no guarding or rebound. Bowel sounds normal. Pelvis:Bleeding: deferred.  Extremities: DVT Evaluation: No evidence of DVT seen on physical exam. No cords or calf tenderness. No significant calf/ankle edema.  Labs:  CBC    Latest Ref Rng & Units 11/27/2021    6:14 AM 11/26/2021    1:17 PM 08/28/2021    6:35 PM  CBC EXTENDED  WBC 4.0 - 10.5 K/uL 8.5  17.3  10.2   RBC 3.87 - 5.11 MIL/uL 3.90  4.82  4.75   Hemoglobin 12.0 - 15.0 g/dL 54.0  08.6  76.1   HCT 36.0 - 46.0 % 34.5  43.2  42.0   Platelets 150 - 400 K/uL 251  284  298   NEUT# 1.7 - 7.7 K/uL 4.3     Lymph# 0.7 - 4.0 K/uL 3.2            Latest Ref Rng & Units  11/26/2021    1:17 PM  CMP  Glucose 70 - 99 mg/dL 96   BUN 6 - 20 mg/dL 9   Creatinine 9.50 - 9.32 mg/dL 6.71   Sodium 245 - 809 mmol/L 136   Potassium 3.5 - 5.1 mmol/L 4.3   Chloride 98 - 111 mmol/L 105   CO2 22 - 32 mmol/L 23   Calcium 8.9 - 10.3 mg/dL 8.9   Total Protein 6.5 - 8.1 g/dL 7.6   Total Bilirubin 0.3 - 1.2 mg/dL 0.5   Alkaline Phos 38 - 126 U/L 69   AST 15 - 41 U/L 16   ALT 0 - 44 U/L 14      Assessment/Plan: Right complex ovarian cyst, concern for intermittent torsion - patient with intermittent pain, gains slow relief with pain medication. Felt lightheaded earlier this morning around 5 AM when trying to ambulate, however had recently received pain medication as well.  Has not required IV pain medication yet during admission. Currently desires to ambulate to restroom. Able to ambulate with assistance from nurse without difficulty this morning. Discussed that if pain is unable to be managed with medication then next step would be to proceed with surgical intervention. Patient has been made NPO in case of surgical intervention. Hgb noted to be decreased since admission.  Concern for possible rupture of cyst. Will repeat CBC and possibly repeat US.  Appendicitis (vs inflammatory reaction) - appears to be responding to antibiotics, WBC trending downward since yesterday, however pain still present in LLQ. Will discuss plan with General Surgery to see if surgical intervention warranted by later this afternoon.  Continue Ted hose for DVT prophylaxis.     A total of 35 minutes were spent during this encounter, including review of recent imaging and labs, face-to-face with time with patient involving counseling and coordination of care, as well as documentation for current visit.    Rubie Maid, MD [facility]

## 2021-11-27 NOTE — Anesthesia Procedure Notes (Signed)
Procedure Name: Intubation Date/Time: 11/27/2021 8:46 PM  Performed by: Jonna Clark, CRNAPre-anesthesia Checklist: Patient identified, Patient being monitored, Timeout performed, Emergency Drugs available and Suction available Patient Re-evaluated:Patient Re-evaluated prior to induction Oxygen Delivery Method: Circle system utilized Preoxygenation: Pre-oxygenation with 100% oxygen Induction Type: IV induction Ventilation: Mask ventilation without difficulty Laryngoscope Size: 3 and McGraph Grade View: Grade I Tube type: Oral Tube size: 7.0 mm Number of attempts: 1 Airway Equipment and Method: Stylet Placement Confirmation: ETT inserted through vocal cords under direct vision, positive ETCO2 and breath sounds checked- equal and bilateral Secured at: 21 cm Tube secured with: Tape Dental Injury: Teeth and Oropharynx as per pre-operative assessment

## 2021-11-27 NOTE — Op Note (Signed)
Procedure(s): APPENDECTOMY LAPAROSCOPIC LAPAROSCOPIC OVARIAN CYSTECTOMY  LYSIS OF ADHESIONS Procedure Note  Audrey Munoz female 26 y.o. 11/27/2021  Indications: The patient is a 26 y.o. [phone] female with LLQ pain, right ~ 5 cm complex ovarian cyst (suspected hemorrhagic) with intermittent torsion, suspected appendicitis. Patient was admitted overnight for expectant management with IV antibiotics (Zosyn) and for pain control, with worsening pain. Decision made for surgical management.  Pre-operative Diagnosis:  LLQ pain, right ~ 5 cm complex ovarian cyst with intermittent torsion, suspected appendicitis, small hemoperitoneum  Post-operative Diagnosis: Same with abdominal adhesions, no evidence of torsion  Surgeon: Suzie Portela, MD and Rubie Maid, MD (co-surgeons),   Assistants:  Surgical scrub assist.   Anesthesia: General endotracheal anesthesia  Findings: The uterus was sounded to 8 Fallopian tubes and left ovary appeared normal. Right ovary with normal. Adhesions of the omentum to the anterior wall from mid-abdomen down to pelvis.   Procedure Details: The patient was seen in the Holding Room. The risks, benefits, complications, treatment options, and expected outcomes were discussed with the patient.  The patient concurred with the proposed plan, giving informed consent.  The site of surgery properly noted/marked. The patient was taken to the Operating Room, identified as Audrey Munoz and the procedure verified as Procedure(s) (LRB): APPENDECTOMY LAPAROSCOPIC (N/A) LAPAROSCOPIC OVARIAN CYSTECTOMY (Right).   She was then placed under general anesthesia without difficulty. She was placed in the dorsal lithotomy position, and was prepped and draped in a sterile manner.  A Time Out was held and the above information confirmed.  A foley catheter was inserted. A sterile speculum was inserted into the vagina and the cervix was grasped at the anterior lip using a single-toothed  tenaculum.  The uterus was sounded to 8.5 cm, and a Hulka clamp was placed for uterine manipulation.  The speculum and tenaculum were then removed. Attention was turned to the abdomen where an umbilical incision was made with the scalpel.  The incision was carried down to the level of the fascia using the bovie.  The fascia was grasped with Kocher clamps. Incision was made into the fascia using sharp and blunt techniques.  The peritoneum was entered bluntly.  11-mm Hasson balloon trocar and sleeve were then advanced without difficulty The abdomen was then insufflated with carbon dioxide gas and adequate pneumoperitoneum was obtained. A 5-mm left lower quadrant port and an 5-mm lower midline port were then placed under direct visualization.  A survey of the patient's pelvis and abdomen revealed the findings as above. The Ligasure device was then used to perform adhesiolysis of the abdominal adhesions noted from mid-abdomen descending to the pelvis.    Next, the right ovary was examined and there was noted to be a small 2-3 mm site of rupture with mild oozing from the cyst.  No evidence of torsion.  The rupture site was then extended to allow for drainage of remaining blood and removal of clots.  The cyst wall was then excised, and any bleeding noted was cauterized using the Ligasure.  Hemostasis was achieved.  Surgicele was placed inside the remaining segment of the ovary.  Left ovary, bilateral fallopian tubes, and ovaries appeared normal. The cyst wall was placed in the anterior cul-de-sac to be removed along with the appendix at termination of procedure. The Hulk clamp was removed from the vagina.    See Dr. Wille Glaser Piscoya's operative note for remaining details of appendectomy procedure.   An experienced assistant was required given the standard of surgical care  given the complexity of the case.  This assistant was needed for exposure, dissection, suctioning, retraction, instrument exchange, and for overall  help during the procedure.   Estimated Blood Loss:  20 ml (mostly from hemoperitoneum.       Drains: foley catheterization prior to procedure          Total IV Fluids:  100 ml  Specimens: Right ovarian cyst wall         Implants: None         Complications:  None; patient tolerated the procedure well.         Disposition: Patient remains in [place] for completion of General Surgery procedure (appendectomy)         Condition: stable   Rubie Maid, MD [place] at [facility])

## 2021-11-27 NOTE — Anesthesia Preprocedure Evaluation (Signed)
Anesthesia Evaluation  Patient identified by MRN, date of birth, ID band Patient awake    Reviewed: Allergy & Precautions, H&P , NPO status , Patient's Chart, lab work & pertinent test results  History of Anesthesia Complications Negative for: history of anesthetic complications  Airway Mallampati: III  TM Distance: >3 FB Neck ROM: full    Dental  (+) Chipped, Dental Advidsory Given   Pulmonary neg pulmonary ROS, former smoker,           Cardiovascular Exercise Tolerance: Good negative cardio ROS       Neuro/Psych Seizures - (as a child),  negative psych ROS   GI/Hepatic GERD  ,  Endo/Other  negative endocrine ROS  Renal/GU   negative genitourinary   Musculoskeletal   Abdominal   Peds  Hematology negative hematology ROS (+)   Anesthesia Other Findings Past Medical History: No date: Gestational diabetes No date: Hypertension     Comment:  with pregnancy No date: Idiopathic intracranial hypertension No date: Seizures (HCC)     Comment:  Seizures as a child with ?med No date: Syncope  Past Surgical History: No date: FOOT GANGLION EXCISION     Comment:  Cyst removed from foot No date: FOOT SURGERY  BMI    Body Mass Index: 35.78 kg/m      Reproductive/Obstetrics negative OB ROS                             Anesthesia Physical  Anesthesia Plan  ASA: III  Anesthesia Plan: General   Post-op Pain Management:    Induction: Intravenous, Rapid sequence and Cricoid pressure planned  PONV Risk Score and Plan: 3 and Ondansetron, Dexamethasone, Midazolam and Treatment may vary due to age or medical condition  Airway Management Planned: Oral ETT  Additional Equipment:   Intra-op Plan:   Post-operative Plan: Extubation in OR  Informed Consent: I have reviewed the patients History and Physical, chart, labs and discussed the procedure including the risks, benefits and  alternatives for the proposed anesthesia with the patient or authorized representative who has indicated his/her understanding and acceptance.       Plan Discussed with: Anesthesiologist  Anesthesia Plan Comments: (Consent via interpreter )        Anesthesia Quick Evaluation

## 2021-11-27 NOTE — Transfer of Care (Signed)
Immediate Anesthesia Transfer of Care Note  Patient: Audrey Munoz  Procedure(s) Performed: APPENDECTOMY LAPAROSCOPIC (Abdomen) LAPAROSCOPIC OVARIAN CYSTECTOMY (Right: Abdomen) LYSIS OF ADHESION LAPAROSCOPIC OVARIAN CYSTECTOMY (Right)  Patient Location: PACU  Anesthesia Type:General  Level of Consciousness: drowsy and patient cooperative  Airway & Oxygen Therapy: Patient Spontanous Breathing  Post-op Assessment: Report given to RN and Post -op Vital signs reviewed and stable  Post vital signs: Reviewed and stable  Last Vitals:  Vitals Value Taken Time  BP 94/83 11/27/21 2231  Temp    Pulse 64 11/27/21 2231  Resp 11 11/27/21 2231  SpO2 96 % 11/27/21 2231  Vitals shown include unvalidated device data.  Last Pain:  Vitals:   11/27/21 1434  TempSrc: Temporal  PainSc: 6          Complications: No notable events documented.

## 2021-11-27 NOTE — Progress Notes (Signed)
[facility] SURGICAL PROGRESS NOTE  Hospital Day(s): 0.   Interval History:  Patient seen and examined No acute events or new complaints overnight.  Patient reports she continues to have right sided abdominal discomfort Continues to have nausea No fever, chills Leukocytosis normalized; 8.5K (from 17.3K) No additional labs this morning She continues on Zosyn She is NPO   Vital signs in last 24 hours: [min-max] current  Temp:  [97.7 F (36.5 C)-98.5 F (36.9 C)] 97.8 F (36.6 C) (11/01 0805) Pulse Rate:  [54-95] 60 (11/01 0805) Resp:  [13-20] 20 (11/01 0805) BP: (92-120)/(47-70) 114/65 (11/01 0805) SpO2:  [96 %-100 %] 96 % (11/01 0805) Weight:  [79.4 kg] 79.4 kg (10/31 1315)     Height: 5\' 3"  (160 cm) Weight: 79.4 kg BMI (Calculated): 31.02   Intake/Output last 2 shifts:  10/31 0701 - 11/01 0700 In: 1250 [P.O.:250; IV Piggyback:1000] Out: 200 [Urine:200]   Physical Exam:  Constitutional: alert, cooperative and no distress  Respiratory: breathing non-labored at rest  Cardiovascular: regular rate and sinus rhythm  Gastrointestinal: Soft, she continues to have RLQ/flank tenderness, unchanged, consistent with retrocecal appearance of appendix on imaging, non-distended, no rebound/guarding. Integumentary: warm, dry  Labs:     Latest Ref Rng & Units 11/27/2021    6:14 AM 11/26/2021    1:17 PM 08/28/2021    6:35 PM  CBC  WBC 4.0 - 10.5 K/uL 8.5  17.3  10.2   Hemoglobin 12.0 - 15.0 g/dL 10/28/2021  48.1  85.6   Hematocrit 36.0 - 46.0 % 34.5  43.2  42.0   Platelets 150 - 400 K/uL 251  284  298       Latest Ref Rng & Units 11/26/2021    1:17 PM 08/28/2021    6:35 PM 08/28/2019   10:16 AM  CMP  Glucose 70 - 99 mg/dL 96  95  10/28/2019   BUN 6 - 20 mg/dL 9  10  12    Creatinine 0.44 - 1.00 mg/dL 970   2.63   Sodium 135 - 145 mmol/L 136  138  137   Potassium 3.5 - 5.1 mmol/L 4.3  3.9  4.0   Chloride 98 - 111 mmol/L 105  109  105   CO2 22 - 32 mmol/L 23  25  19     Calcium 8.9 - 10.3 mg/dL 8.9  8.9  8.9   Total Protein 6.5 - 8.1 g/dL 7.6   8.2   Total Bilirubin 0.3 - 1.2 mg/dL 0.5   0.8   Alkaline Phos 38 - 126 U/L 69   97   AST 15 - 41 U/L 16   16   ALT 0 - 44 U/L 14   29     Imaging studies: No new pertinent imaging studies   Assessment/Plan: (ICD-10's: K35.80) 26 y.o. female with persistent RLQ abdominal pain concerning for possible appendicitis concomitant with possible right complex ovarian cyst    - Although her WBC did improve, she continues to have persistent right sided abdominal pain consistent with location of retrocecal appendix. Given this, we do feel it would be most prudent to proceed with appendectomy +/- GYN involvement pending further work up of right complex ovarian cyst. This will likely be this afternoon with Dr 8.85 pending OR/Anesthesia availability.   - All risks, benefits, and alternatives to above procedure(s) were discussed with the patient, all of her questions were answered to her expressed satisfaction, patient expresses she wishes to proceed, and informed consent was  obtained.   - Continue NPO + IVF Support - Continue IV Abx (Zosyn)   - Monitor abdominal examination   - Pain control prn; antiemetics prn   All of the above findings and recommendations were discussed with the patient, and the medical team, and all of patient's questions were answered to her expressed satisfaction.  -- Edison Simon, PA-C [facility] 11/27/2021, 9:21 AM M-F: 7am - 4pm

## 2021-11-27 NOTE — Op Note (Signed)
  Procedure Date:  11/27/2021  Pre-operative Diagnosis:  Acute appendicitis, right ovarian complex cyst with intermittent torsion  Post-operative Diagnosis: Acute appendicitis, right ovarian complex cyst with intermittent torsion  Procedure: Laparoscopic ovarian cystectomy Laparoscopic appendectomy  Surgeons:  Melvyn Neth, MD and Rubie Maid, MD  Anesthesia:  General endotracheal  Estimated Blood Loss:  20 ml  Specimens:   Right ovarian cyst wall Appendix  Complications:  None  Indications for Procedure:  This is a 26 y.o. female who presents with abdominal pain and workup revealing a right ovarian hemorrhagic cyst with intermittent torsion as well as possible acute appendicitis.  The options of surgery versus observation were reviewed with the patient and/or family.  After observation overnight, the patient's pain continued and repeat U/S showed possible rupture of the cyst.  The risks of bleeding, infection, recurrence of symptoms, negative laparoscopy, potential for an open procedure, bowel injury, abscess or infection, were all discussed with the patient and she was willing to proceed.  Description of Procedure: Please see Dr. Andreas Blower operative note for the first portion of the surgery including port placement and lysis of adhesions of the omentum to the anterior abdominal wall.  We then proceeded with the laparoscopic ovarian cystectomy.  Please see Dr. Andreas Blower op note for further details.  Once her portion was completed, the right lower quadrant was inspected and the appendix was in retrocecal position.  The terminal ileum and cecum were mobilized off the abdominal sidewall using LigaSure.  The appendix was then exposed and was found to be distended with hyperemia, consistent with appendicitis.  The appendix was carefully dissected.  The mesoappendix was divided using the LigaSure.  The base of the appendix was dissected out and divided with a standard load Endo GIA.  The  appendix and the ovarian cyst wall were placed in an Endocatch bag.  The right lower quadrant was then inspected again revealing an intact staple line, no bleeding, and no bowel injury.  The area was irrigated.  The 5 mm ports were removed under direct visualization and the Hasson trocar was removed.  The Endocatch bag was brought out through the umbilical incision.  The fascial opening was closed using 0 vicryl suture.  Local anesthetic was infused in all incisions and the incisions were closed with 4-0 Monocryl.  The wounds were cleaned and sealed with DermaBond.  Foley catheter was removed and the patient was emerged from anesthesia and extubated and brought to the recovery room for further management.  The patient tolerated the procedure well and all counts were correct at the end of the case.   Melvyn Neth, MD

## 2021-11-28 ENCOUNTER — Other Ambulatory Visit: Payer: Self-pay

## 2021-11-28 ENCOUNTER — Encounter: Payer: Self-pay | Admitting: Surgery

## 2021-11-28 MED ORDER — OXYCODONE-ACETAMINOPHEN 5-325 MG PO TABS: 1.0000 | ORAL_TABLET | Freq: Four times a day (QID) | ORAL | 0 refills | Status: DC | PRN

## 2021-11-28 MED ORDER — LACTATED RINGERS IV BOLUS
500.0000 mL | Freq: Once | INTRAVENOUS | Status: AC
  Administered 2021-11-28: 500 mL via INTRAVENOUS

## 2021-11-28 MED ORDER — DOCUSATE SODIUM 100 MG PO CAPS: 100.0000 mg | ORAL_CAPSULE | Freq: Two times a day (BID) | ORAL | 0 refills | Status: DC | PRN

## 2021-11-28 MED ORDER — SODIUM CHLORIDE 0.9 % IV SOLN: INTRAVENOUS | Status: DC | PRN

## 2021-11-28 MED ORDER — SIMETHICONE 80 MG PO CHEW: 80.0000 mg | CHEWABLE_TABLET | Freq: Four times a day (QID) | ORAL | 1 refills | Status: DC | PRN

## 2021-11-28 MED ORDER — IBUPROFEN 800 MG PO TABS: 800.0000 mg | ORAL_TABLET | Freq: Three times a day (TID) | ORAL | 0 refills | Status: DC | PRN

## 2021-11-28 NOTE — Progress Notes (Signed)
[place] Hospital Day(s): 0.   Post op day(s): 1 Day Post-Op.   Interval History:  Patient seen and examined No acute events or new complaints overnight.  Patient reports she is doing better; abdominal soreness No fever, chills, nausea, emesis  No new labs this AM She is on regular diet Continues on Zosyn   Vital signs in last 24 hours: [min-max] current  Temp:  [97.4 F (36.3 C)-98.8 F (37.1 C)] 98.8 F (37.1 C) (11/02 0403) Pulse Rate:  [56-90] 75 (11/02 0403) Resp:  [13-23] 18 (11/02 0403) BP: (85-115)/(43-83) 115/56 (11/02 0403) SpO2:  [93 %-99 %] 99 % (11/02 0403) Weight:  [79.4 kg] 79.4 kg (11/01 1434)     Height: 5\' 3"  (160 cm) Weight: 79.4 kg BMI (Calculated): 31.02   Intake/Output last 2 shifts:  11/01 0701 - 11/02 0700 In: 2754.8 [P.O.:480; I.V.:1625.1; IV Piggyback:649.7] Out: 1125 [Urine:1100; Blood:25]   Physical Exam:  Constitutional: alert, cooperative and no distress  Respiratory: breathing non-labored at rest  Cardiovascular: regular rate and sinus rhythm  Gastrointestinal: soft, incisional soreness, and non-distended. No rebound/guarding Integumentary: Laparoscopic incisions are CDI with dermabond, no erythema or drainage   Labs:     Latest Ref Rng & Units 11/27/2021   11:45 AM 11/27/2021    6:14 AM 11/26/2021    1:17 PM  CBC  WBC 4.0 - 10.5 K/uL 8.3  8.5  17.3   Hemoglobin 12.0 - 15.0 g/dL 10.9  11.4  14.2   Hematocrit 36.0 - 46.0 % 33.1  34.5  43.2   Platelets 150 - 400 K/uL 236  251  284       Latest Ref Rng & Units 11/26/2021    1:17 PM 08/28/2021    6:35 PM 08/28/2019   10:16 AM  CMP  Glucose 70 - 99 mg/dL 96  95  102   BUN 6 - 20 mg/dL 9  10  12    Creatinine 0.44 - 1.00 mg/dL 0.54  0.72  0.73   Sodium 135 - 145 mmol/L 136  138  137   Potassium 3.5 - 5.1 mmol/L 4.3  3.9  4.0   Chloride 98 - 111 mmol/L 105  109  105   CO2 22 - 32 mmol/L 23  25  19    Calcium 8.9 - 10.3 mg/dL 8.9  8.9  8.9    Total Protein 6.5 - 8.1 g/dL 7.6   8.2   Total Bilirubin 0.3 - 1.2 mg/dL 0.5   0.8   Alkaline Phos 38 - 126 U/L 69   97   AST 15 - 41 U/L 16   16   ALT 0 - 44 U/L 14   29      Imaging studies: No new pertinent imaging studies   Assessment/Plan: 26 y.o. female 1 Day Post-Op s/p laparoscopic appendectomy for acute appendicitis and laparoscopic right ovarian cystectomy for right ~ 5 cm complex ovarian cyst (suspected hemorrhagic) with intermittent torsion .   - Okay to continue regular diet  - No need for ABx - Monitor abdominal examination - Pain control prn; antiemetics prn   - Mobilization as tolerated    - Discharge Planning: Okay for discharge from surgical perspective. Will update follow up and post-op instructions from general surgery standpoint. Work note given.   All of the above findings and recommendations were discussed with the patient, and the medical team, and all of patient's questions were answered to her expressed satisfaction.  -- Alroy Dust  Freda Jackson [facility] 11/28/2021, 7:24 AM M-F: 7am - 4pm

## 2021-11-28 NOTE — Progress Notes (Signed)
Hospital Day # 2, admitted for colicky LLQ pain, cannot r/o appendicitis, right complex ovarian cyst with possible intermittent torsion.   POD#1 s/p laparoscopic right ovarian cystectomy and appendectomy.   Language Line Interpreter Barnabas Lister [phone] used for Spanish interpretation  Subjective: Patient denies any major complaints today.  Notes she does feel sore in her incisions but pain overall has improved.  Feels a lot of gas pain.  Objective: Vitals:   11/27/21 2358 11/28/21 0113 11/28/21 0403 11/28/21 0740  BP: 98/64 114/61 (!) 115/56 (!) 100/57  Pulse: 71 90 75 (!) 57  Resp: 16 18 18 17   Temp: 97.7 F (36.5 C) 98.3 F (36.8 C) 98.8 F (37.1 C) 97.7 F (36.5 C)  TempSrc: Oral Oral Oral Oral  SpO2: 96% 97% 99% 99%  Weight:      Height:        Physical Exam:  General: alert and no distress  Lungs: clear to auscultation bilaterally Heart: regular rate and rhythm, S1, S2 normal, no murmur, click, rub or gallop Abdomen:  soft, appropriately tender to palpation. Bowel sounds normal.  Incision sites clean, dry, and intact. Pelvis:Bleeding: deferred.  Extremities: DVT Evaluation: No evidence of DVT seen on physical exam. No cords or calf tenderness. No significant calf/ankle edema.  Labs:  CBC    Latest Ref Rng & Units 11/27/2021   11:45 AM 11/27/2021    6:14 AM 11/26/2021    1:17 PM  CBC EXTENDED  WBC 4.0 - 10.5 K/uL 8.3  8.5  17.3   RBC 3.87 - 5.11 MIL/uL 3.76  3.90  4.82   Hemoglobin 12.0 - 15.0 g/dL 10.9  11.4  14.2   HCT 36.0 - 46.0 % 33.1  34.5  43.2   Platelets 150 - 400 K/uL 236  251  284   NEUT# 1.7 - 7.7 K/uL  4.3    Lymph# 0.7 - 4.0 K/uL  3.2           Latest Ref Rng & Units 11/26/2021    1:17 PM  CMP  Glucose 70 - 99 mg/dL 96   BUN 6 - 20 mg/dL 9   Creatinine 0.44 - 1.00 mg/dL 0.54   Sodium 135 - 145 mmol/L 136   Potassium 3.5 - 5.1 mmol/L 4.3   Chloride 98 - 111 mmol/L 105   CO2 22 - 32 mmol/L 23   Calcium 8.9 - 10.3 mg/dL 8.9   Total Protein 6.5 -  8.1 g/dL 7.6   Total Bilirubin 0.3 - 1.2 mg/dL 0.5   Alkaline Phos 38 - 126 U/L 69   AST 15 - 41 U/L 16   ALT 0 - 44 U/L 14      Assessment/Plan: POD#1 s/p laparoscopic right ovarian cystectomy and appendectomy. -Continue routine postoperative care. -Discussed methods to decrease gas pain. -Encouraged ambulation -Regular diet as tolerated -Can DC home later today as long as pain remains controlled and tolerating regular diet.  A total of 15 minutes were spent face-to-face with the patient during this encounter and over half of that time involved counseling and coordination of discharge care.    Rubie Maid, MD [facility] at [facility]

## 2021-11-28 NOTE — Anesthesia Postprocedure Evaluation (Signed)
Anesthesia Post Note  Patient: Jaquisha Frech  Procedure(s) Performed: APPENDECTOMY LAPAROSCOPIC (Abdomen) LAPAROSCOPIC OVARIAN CYSTECTOMY (Right: Abdomen) LYSIS OF ADHESION LAPAROSCOPIC OVARIAN CYSTECTOMY (Right)  Patient location during evaluation: PACU Anesthesia Type: General Level of consciousness: awake and alert Pain management: pain level controlled Vital Signs Assessment: post-procedure vital signs reviewed and stable Respiratory status: spontaneous breathing, nonlabored ventilation, respiratory function stable and patient connected to nasal cannula oxygen Cardiovascular status: blood pressure returned to baseline and stable Postop Assessment: no apparent nausea or vomiting Anesthetic complications: no   No notable events documented.   Last Vitals:  Vitals:   11/27/21 2358 11/28/21 0113  BP: 98/64 114/61  Pulse: 71 90  Resp: 16 18  Temp: 36.5 C 36.8 C  SpO2: 96% 97%    Last Pain:  Vitals:   11/28/21 0114  TempSrc:   PainSc: 5                  Arita Miss

## 2021-11-28 NOTE — Discharge Summary (Signed)
Physician Discharge Summary  Patient ID: Audrey Munoz MRN: [id number] DOB/AGE: 1995/08/23 26 y.o.  Admit date: 11/26/2021 Discharge date: 11/28/2021  Admission Diagnoses: Right ovarian cyst with intermittent torsion, acute appendicitis  Discharge Diagnoses:  Principal Problem:   Ovarian cyst Active Problems:   Pelvic adhesions   Acute appendicitis with localized peritonitis, without perforation, abscess, or gangrene   Discharged Condition: good  Hospital Course: The patient was admitted to the hospital for observation after presentation to the emergency room due to complaints of right lower and mid upper quadrant pain associated with nausea and vomiting x2 days.  On CT scan and ultrasound it was demonstrated the patient had a right adnexal complex cyst with concerns for ovarian torsion, as well as an enlarged appendix with some associated inflammation.  Expectant management initially performed with treatment of IV antibiotics (Zosyn) and pain control.  However patient's pain progressively worsened and the decision was made to proceed with surgical intervention.  On hospital day #1, patient underwent laparoscopic appendectomy and right ovarian cyst aspiration and cystectomy.  By postop day #1, HD #2, patient was ambulating well, tolerating regular diet, and pain was well controlled.  She was deemed stable for discharge.  Consults: general surgery and gynecology  Significant Diagnostic Studies: labs:  and radiology: CT scan:   and Ultrasound:   Results for orders placed or performed during the hospital encounter of 11/26/21  Chlamydia/NGC rt PCR ([facility] only)   Specimen: Cervical/Vaginal swab; GU  Result Value Ref Range   Specimen source GC/Chlam ENDOCERVICAL    Chlamydia Tr NOT DETECTED NOT DETECTED   N gonorrhoeae NOT DETECTED NOT DETECTED  Wet prep, genital   Specimen: Cervical/Vaginal swab  Result Value Ref Range   Yeast Wet Prep HPF POC NONE SEEN NONE SEEN   Trich, Wet Prep  NONE SEEN NONE SEEN   Clue Cells Wet Prep HPF POC NONE SEEN NONE SEEN   WBC, Wet Prep HPF POC <10 <10   Sperm NONE SEEN   Lipase, blood  Result Value Ref Range   Lipase 32 11 - 51 U/L  Comprehensive metabolic panel  Result Value Ref Range   Sodium 136 135 - 145 mmol/L   Potassium 4.3 3.5 - 5.1 mmol/L   Chloride 105 98 - 111 mmol/L   CO2 23 22 - 32 mmol/L   Glucose, Bld 96 70 - 99 mg/dL   BUN 9 6 - 20 mg/dL   Creatinine, Ser 1.32 0.44 - 1.00 mg/dL   Calcium 8.9 8.9 - 44.0 mg/dL   Total Protein 7.6 6.5 - 8.1 g/dL   Albumin 4.0 3.5 - 5.0 g/dL   AST 16 15 - 41 U/L   ALT 14 0 - 44 U/L   Alkaline Phosphatase 69 38 - 126 U/L   Total Bilirubin 0.5 0.3 - 1.2 mg/dL   GFR, Estimated >10 >27 mL/min   Anion gap 8 5 - 15  CBC  Result Value Ref Range   WBC 17.3 (H) 4.0 - 10.5 K/uL   RBC 4.82 3.87 - 5.11 MIL/uL   Hemoglobin 14.2 12.0 - 15.0 g/dL   HCT 25.3 66.4 - 40.3 %   MCV 89.6 80.0 - 100.0 fL   MCH 29.5 26.0 - 34.0 pg   MCHC 32.9 30.0 - 36.0 g/dL   RDW 47.4 25.9 - 56.3 %   Platelets 284 150 - 400 K/uL   nRBC 0.0 0.0 - 0.2 %  Urinalysis, Routine w reflex microscopic  Result Value Ref Range  Color, Urine YELLOW (A) YELLOW   APPearance HAZY (A) CLEAR   Specific Gravity, Urine 1.017 1.005 - 1.030   pH 7.0 5.0 - 8.0   Glucose, UA NEGATIVE NEGATIVE mg/dL   Hgb urine dipstick NEGATIVE NEGATIVE   Bilirubin Urine NEGATIVE NEGATIVE   Ketones, ur NEGATIVE NEGATIVE mg/dL   Protein, ur NEGATIVE NEGATIVE mg/dL   Nitrite NEGATIVE NEGATIVE   Leukocytes,Ua NEGATIVE NEGATIVE  Pregnancy, urine  Result Value Ref Range   Preg Test, Ur NEGATIVE NEGATIVE  CBC with Differential/Platelet  Result Value Ref Range   WBC 8.5 4.0 - 10.5 K/uL   RBC 3.90 3.87 - 5.11 MIL/uL   Hemoglobin 11.4 (L) 12.0 - 15.0 g/dL   HCT 03.8 (L) 33.3 - 83.2 %   MCV 88.5 80.0 - 100.0 fL   MCH 29.2 26.0 - 34.0 pg   MCHC 33.0 30.0 - 36.0 g/dL   RDW 91.9 16.6 - 06.0 %   Platelets 251 150 - 400 K/uL   nRBC 0.0 0.0 - 0.2  %   Neutrophils Relative % 51 %   Neutro Abs 4.3 1.7 - 7.7 K/uL   Lymphocytes Relative 38 %   Lymphs Abs 3.2 0.7 - 4.0 K/uL   Monocytes Relative 8 %   Monocytes Absolute 0.7 0.1 - 1.0 K/uL   Eosinophils Relative 3 %   Eosinophils Absolute 0.2 0.0 - 0.5 K/uL   Basophils Relative 0 %   Basophils Absolute 0.0 0.0 - 0.1 K/uL   Immature Granulocytes 0 %   Abs Immature Granulocytes 0.02 0.00 - 0.07 K/uL  CBC  Result Value Ref Range   WBC 8.3 4.0 - 10.5 K/uL   RBC 3.76 (L) 3.87 - 5.11 MIL/uL   Hemoglobin 10.9 (L) 12.0 - 15.0 g/dL   HCT 04.5 (L) 99.7 - 74.1 %   MCV 88.0 80.0 - 100.0 fL   MCH 29.0 26.0 - 34.0 pg   MCHC 32.9 30.0 - 36.0 g/dL   RDW 42.3 95.3 - 20.2 %   Platelets 236 150 - 400 K/uL   nRBC 0.0 0.0 - 0.2 %  Type and screen [facility]  Result Value Ref Range   ABO/RH(D) O POS    Antibody Screen NEG    Sample Expiration      11/30/2021,2359 Performed at [facility], [address] [place], [place] [address]      US PELVIC COMPLETE W TRANSVAGINAL AND TORSION R/O CLINICAL DATA:  Pelvic pain  EXAM: TRANSABDOMINAL AND TRANSVAGINAL ULTRASOUND OF PELVIS  DOPPLER ULTRASOUND OF OVARIES  TECHNIQUE: Both transabdominal and transvaginal ultrasound examinations of the pelvis were performed. Transabdominal technique was performed for global imaging of the pelvis including uterus, ovaries, adnexal regions, and pelvic cul-de-sac.  It was necessary to proceed with endovaginal exam following the transabdominal exam to visualize the endometrium and ovaries. Color and duplex Doppler ultrasound was utilized to evaluate blood flow to the ovaries.  COMPARISON:  11/26/2021  FINDINGS: Uterus  Measurements: 8.3 x 3.8 x 4.1 cm = volume: 66 mL. No fibroids or other mass visualized.  Endometrium  Thickness: 5.2.  No focal abnormality visualized.  Right ovary  Measurements: 5.3 x 3.5 x 3.4 cm = volume: 33.2 mL. There is 4.3 x 3.7 x 3.4 cm  complex cyst.  Left ovary  Measurements: 2.9 x 2 x 1.7 cm = volume: 5 mL. Normal appearance/no adnexal mass.  Pulsed Doppler evaluation of both ovaries demonstrates normal low-resistance arterial and venous waveforms.  Other findings  Small to moderate amount of free fluid is seen in pelvis.  IMPRESSION: There is no evidence of ovarian torsion. There is 4.3 cm complex cyst in the right adnexa suggesting hemorrhagic functional cyst. Small to moderate amount of free fluid in pelvis may be due to recent rupture of ovarian cyst or follicle.  Electronically Signed   By: Elmer Picker M.D.   On: 11/27/2021 14:54   Treatments: IV hydration, antibiotics: Zosyn, analgesia: Percocet, and surgery: Laparoscopic appendectomy and right ovarian aspiration and cystectomy  Discharge Exam: Blood pressure (!) 100/57, pulse (!) 57, temperature 97.7 F (36.5 C), temperature source Oral, resp. rate 17, height 5\' 3"  (1.6 m), weight 79.4 kg, last menstrual period 10/07/2021, SpO2 99 %, unknown if currently breastfeeding. General: alert and no distress  Lungs: clear to auscultation bilaterally Heart: regular rate and rhythm, S1, S2 normal, no murmur, click, rub or gallop Abdomen:  soft, appropriately tender to palpation. Bowel sounds normal.  Incision sites clean, dry, and intact. Pelvis:Bleeding: deferred.  Extremities: DVT Evaluation: No evidence of DVT seen on physical exam. No cords or calf tenderness. No significant calf/ankle edema.    Disposition: Discharge disposition: 01-Home or Self Care       Discharge Instructions     Discharge patient   Complete by: As directed    Discharge disposition: 01-Home or Self Care   Discharge patient date: 11/28/2021      Allergies as of 11/28/2021   No Known Allergies      Medication List     STOP taking these medications    cyclobenzaprine 5 MG tablet Commonly known as: FLEXERIL   naproxen 500 MG tablet Commonly known as:  NAPROSYN       TAKE these medications    docusate sodium 100 MG capsule Commonly known as: COLACE Take 1 capsule (100 mg total) by mouth 2 (two) times daily as needed for mild constipation.   ibuprofen 800 MG tablet Commonly known as: ADVIL Take 1 tablet (800 mg total) by mouth every 8 (eight) hours as needed.   oxyCODONE-acetaminophen 5-325 MG tablet Commonly known as: PERCOCET/ROXICET Take 1-2 tablets by mouth every 6 (six) hours as needed for severe pain (moderate to severe pain (when tolerating fluids)).   simethicone 80 MG chewable tablet Commonly known as: Gas-X Chew 1 tablet (80 mg total) by mouth 4 (four) times daily as needed for flatulence (bloating).        Follow-up Information     Rubie Maid, MD Follow up.   Specialties: Obstetrics and Gynecology, Radiology Why: 7-10 days for post-op appointment Contact information: [place] [address] [phone]         Olean Ree, MD Follow up in 2 week(s).   Specialty: General Surgery Why: for post-op visit Contact information: [address] [place] [address] [phone]                 Signed: Rubie Maid, MD 11/28/2021, 1:36 PM

## 2021-11-28 NOTE — Progress Notes (Signed)
Patient discharged. Discharge instructions given. Patient verbalizes understanding. Transported by axillary. 

## 2021-11-28 NOTE — Discharge Instructions (Signed)
In addition to included general post-operative instructions,  Diet: Resume home diet.   Activity: No heavy lifting >20 pounds (children, pets, laundry, garbage) or strenuous activity for 4 weeks, but light activity and walking are encouraged. Do not drive or drink alcohol if taking narcotic pain medications or having pain that might distract from driving.  Wound care: 2 days after surgery (11/03), you may shower/get incision wet with soapy water and pat dry (do not rub incisions), but no baths or submerging incision underwater until follow-up.   Medications: Resume all home medications. For mild to moderate pain: acetaminophen (Tylenol) or ibuprofen/naproxen (if no kidney disease). Combining Tylenol with alcohol can substantially increase your risk of causing liver disease. Narcotic pain medications, if prescribed, can be used for severe pain, though may cause nausea, constipation, and drowsiness. Do not combine Tylenol and Percocet (or similar) within a 6 hour period as Percocet (and similar) contain(s) Tylenol. If you do not need the narcotic pain medication, you do not need to fill the prescription.  Call office [phone] - General Surgery) at any time if any questions, worsening pain, fevers/chills, bleeding, drainage from incision site, or other concerns.

## 2021-12-02 ENCOUNTER — Telehealth: Payer: Self-pay

## 2021-12-02 NOTE — Telephone Encounter (Signed)
-----   Message from Olean Ree, MD sent at 11/29/2021  2:26 PM EDT ----- Regarding: post-op follow up Hi,  This patient had a combined lap appy and right ovarian cystectomy with me and Dr. Marcelline Mates on 11/1.  She was discharged yesterday.  Can y'all contact her to set up a follow up with me in 2 weeks?  Thanks,  [employer]

## 2021-12-02 NOTE — Telephone Encounter (Signed)
Message left via interpretor services for the patient to call the office back to schedule a 2 week post op visit.

## 2021-12-03 LAB — SURGICAL PATHOLOGY

## 2021-12-11 ENCOUNTER — Other Ambulatory Visit: Payer: Self-pay

## 2021-12-11 ENCOUNTER — Ambulatory Visit (INDEPENDENT_AMBULATORY_CARE_PROVIDER_SITE_OTHER): Payer: Medicaid Other | Admitting: Obstetrics and Gynecology

## 2021-12-11 ENCOUNTER — Ambulatory Visit (INDEPENDENT_AMBULATORY_CARE_PROVIDER_SITE_OTHER): Payer: Medicaid Other | Admitting: Surgery

## 2021-12-11 ENCOUNTER — Encounter: Payer: Self-pay | Admitting: Surgery

## 2021-12-11 ENCOUNTER — Encounter: Payer: Self-pay | Admitting: Obstetrics and Gynecology

## 2021-12-11 VITALS — BP 115/68 | HR 63 | Resp 16 | Ht 63.0 in | Wt 179.6 lb

## 2021-12-11 VITALS — BP 109/76 | HR 79 | Temp 98.4°F | Ht 63.0 in | Wt 176.0 lb

## 2021-12-11 DIAGNOSIS — R300 Dysuria: Secondary | ICD-10-CM | POA: Diagnosis not present

## 2021-12-11 DIAGNOSIS — Z09 Encounter for follow-up examination after completed treatment for conditions other than malignant neoplasm: Secondary | ICD-10-CM

## 2021-12-11 DIAGNOSIS — K353 Acute appendicitis with localized peritonitis, without perforation or gangrene: Secondary | ICD-10-CM

## 2021-12-11 LAB — POCT URINALYSIS DIPSTICK
Bilirubin, UA: NEGATIVE
Blood, UA: NEGATIVE
Glucose, UA: NEGATIVE
Ketones, UA: NEGATIVE
Leukocytes, UA: NEGATIVE
Nitrite, UA: NEGATIVE
Protein, UA: POSITIVE — AB
Spec Grav, UA: 1.02 (ref 1.010–1.025)
Urobilinogen, UA: 0.2 E.U./dL
pH, UA: 6.5 (ref 5.0–8.0)

## 2021-12-11 MED ORDER — CIPROFLOXACIN HCL 500 MG PO TABS: 500.0000 mg | ORAL_TABLET | Freq: Two times a day (BID) | ORAL | 0 refills | Status: DC

## 2021-12-11 NOTE — Progress Notes (Signed)
12/11/2021  HPI: Audrey Munoz is a 26 y.o. female s/p laparoscopic appendectomy and right ovarian cystectomy on 11/27/21 with me and Dr. Valentino Saxon.  She presents today for follow up.  Reports some discomfort at the incisions, but her mobility and pain are improving.  She reports burning sensation with urination.  Vital signs: BP 109/76   Pulse 79   Temp 98.4 F (36.9 C) (Oral)   Ht 5\' 3"  (1.6 m)   Wt 176 lb (79.8 kg)   LMP 10/07/2021   SpO2 97%   BMI 31.18 kg/m    Physical Exam: Constitutional: No acute distress Abdomen:  soft, non-distended, appropriately sore to palpation.  Incisions are clean, dry, intact, with mild resolving ecchymosis.  Assessment/Plan: This is a 26 y.o. female s/p laparoscopic appendectomy and right ovarian cystectomy  --Discussed appy path results -- acute appendicitis, negative for malignancy. --Discussed that the discomfort at the incisions is normal and will continue to improve.  Every patient is different and some take longer or shorter time.   --Reminded of no heavy lilftin/pushing restrictions.  She may return to work on 12/30/21.  Work note given. --She reports having urinary burning sensation.  She did have a foley catheter during surgery.  As a precaution, will given course of Cipro. --Follow up as needed.   14/4/23, MD [facility]

## 2021-12-11 NOTE — Progress Notes (Unsigned)
    OBSTETRICS/GYNECOLOGY POST-OPERATIVE CLINIC VISIT  Subjective:    [employer] [phone] used for today's visit.    Audrey Munoz is a 26 y.o. female who presents to the clinic 2 weeks status post LAPAROSCOPIC APPENDECTOMY AND OVARIAN CYSTECTOMY, LYSIS OF ADHESIONs  for appendicitis, right complex ovarian cyst . Eating a regular diet with difficulty. Bowel movements are normal. Pain is controlled with current analgesics. Medications being used: ibuprofen (OTC). Reports difficulties with urination, notes it is painful with urination feels like something stabbing and like the catheter is still there.    The following portions of the patient's history were reviewed and updated as appropriate: allergies, current medications, past family history, past medical history, past social history, past surgical history, and problem list.  Review of Systems Pertinent items are noted in HPI.   Objective:   Resp 16   Ht 5\' 3"  (1.6 m)   Wt 179 lb 9.6 oz (81.5 kg)   LMP 10/07/2021   BMI 31.81 kg/m  Body mass index is 31.81 kg/m.  General:  alert and no distress  Abdomen: soft, bowel sounds active, non-tender  Incision:   healing well, no drainage, no erythema, no hernia, no seroma, no swelling, no dehiscence, incision well approximated     Pathology:   A.  OVARIAN CYST WALL, RIGHT; CYSTECTOMY:  - HEMORRHAGIC CORPUS LUTEAL CYST.  - NEGATIVE FOR MALIGNANCY.   B.  APPENDIX; APPENDECTOMY:  - ACUTE APPENDICITIS AND PERIAPPENDICITIS.  - NEGATIVE FOR MALIGNANCY.     Labs:  Results for orders placed or performed in visit on 12/11/21  POCT Urinalysis Dipstick  Result Value Ref Range   Color, UA     Clarity, UA     Glucose, UA Negative Negative   Bilirubin, UA Negative    Ketones, UA Negative    Spec Grav, UA 1.020 1.010 - 1.025   Blood, UA Negative    pH, UA 6.5 5.0 - 8.0   Protein, UA Positive (A) Negative   Urobilinogen, UA 0.2 0.2 or 1.0 E.U./dL   Nitrite, UA  Negative    Leukocytes, UA Negative Negative   Appearance     Odor      Assessment:   Patient s/p APPENDECTOMY LAPAROSCOPIC LAPAROSCOPIC OVARIAN CYSTECTOMY LYSIS OF ADHESION  (surgery)  Postoperative course complicated by urinary symptoms.    Plan:   1. Continue any current medications as instructed by provider.  2. Wound care discussed. 3. Operative findings again reviewed. Pathology report discussed. 4. Activity restrictions: none 5. Anticipated return to work: now if applicable. 6. UA performed for urinary symptoms.Advised on OTC AZO for urinary symptoms as testing negative for UTI today.  7. Follow up: as needed.     12/13/21, MD [facility]

## 2021-12-11 NOTE — Patient Instructions (Signed)

## 2022-02-04 ENCOUNTER — Telehealth: Payer: Self-pay

## 2022-02-04 NOTE — Telephone Encounter (Signed)
Received today. Will complete this week when I am scheduled FMLA.

## 2022-02-04 NOTE — Telephone Encounter (Signed)
Padman from [employer] calling to check on the status of form faxed to [place] on 12/27/21; ref # [id number].  [phone]

## 2022-02-05 NOTE — Telephone Encounter (Signed)
See patient message

## 2022-05-14 ENCOUNTER — Emergency Department
Admission: EM | Admit: 2022-05-14 | Discharge: 2022-05-14 | Disposition: A | Discharge status: Home / Self Care | Payer: Medicaid Other | Attending: Emergency Medicine | Admitting: Emergency Medicine

## 2022-05-14 ENCOUNTER — Other Ambulatory Visit: Payer: Self-pay

## 2022-05-14 DIAGNOSIS — J069 Acute upper respiratory infection, unspecified: Secondary | ICD-10-CM | POA: Diagnosis not present

## 2022-05-14 DIAGNOSIS — U071 COVID-19: Secondary | ICD-10-CM | POA: Diagnosis not present

## 2022-05-14 DIAGNOSIS — R059 Cough, unspecified: Secondary | ICD-10-CM | POA: Diagnosis present

## 2022-05-14 LAB — RESP PANEL BY RT-PCR (RSV, FLU A&B, COVID)  RVPGX2
Influenza A by PCR: NEGATIVE
Influenza B by PCR: NEGATIVE
Resp Syncytial Virus by PCR: NEGATIVE
SARS Coronavirus 2 by RT PCR: POSITIVE — AB

## 2022-05-14 LAB — GROUP A STREP BY PCR: Group A Strep by PCR: NOT DETECTED

## 2022-05-14 NOTE — ED Provider Notes (Signed)
[facility] Provider Note    Event Date/Time   First MD Initiated Contact with Patient 05/14/22 1743     (approximate)   History   URI   HPI  Audrey Munoz is a 27 y.o. female who presents today for evaluation of nasal congestion, cough, and sore throat for the past 2 days.  She is unsure of any sick contacts.  No fevers or chills.  No chest pain or shortness of breath.  No dysphagia or voice change.  She reports that she has gotten the COVID vaccinations.  Patient Active Problem List   Diagnosis Date Noted   Pelvic adhesions 11/27/2021   Acute appendicitis with localized peritonitis, without perforation, abscess, or gangrene    Ovarian cyst 11/26/2021   Abdominal pain    Idiopathic intracranial hypertension 10/22/2018   Obesity (BMI 30.0-34.9) 08/05/2018          Physical Exam   Triage Vital Signs: ED Triage Vitals  Enc Vitals Group     BP 05/14/22 1641 130/68     Pulse Rate 05/14/22 1641 (!) 101     Resp 05/14/22 1641 16     Temp 05/14/22 1641 98.4 F (36.9 C)     Temp Source 05/14/22 1641 Oral     SpO2 05/14/22 1641 100 %     Weight 05/14/22 1638 179 lb 10.8 oz (81.5 kg)     Height 05/14/22 1638  (1.6 m)     Head Circumference --      Peak Flow --      Pain Score 05/14/22 1638 9     Pain Loc --      Pain Edu? --      Excl. in GC? --     Most recent vital signs: Vitals:   05/14/22 1641  BP: 130/68  Pulse: (!) 101  Resp: 16  Temp: 98.4 F (36.9 C)  SpO2: 100%    Physical Exam Vitals and nursing note reviewed.  Constitutional:      General: Awake and alert. No acute distress.    Appearance: Normal appearance. The patient is overweight.  HENT:     Head: Normocephalic and atraumatic.     Mouth: Mucous membranes are moist. Uvula midline.  No tonsillar exudate.  No soft palate fluctuance.  No trismus.  No voice change.  No sublingual swelling.  No tender cervical lymphadenopathy.  No nuchal rigidity Nasal  congestion present Eyes:     General: PERRL. Normal EOMs        Right eye: No discharge.        Left eye: No discharge.     Conjunctiva/sclera: Conjunctivae normal.  Cardiovascular:     Rate and Rhythm: Normal rate and regular rhythm.     Pulses: Normal pulses.  Pulmonary:     Effort: Pulmonary effort is normal. No respiratory distress.  Speaking easily in complete sentences, no accessory muscle use    Breath sounds: Normal breath sounds.  Abdominal:     Abdomen is soft. There is no abdominal tenderness. No rebound or guarding. No distention. Musculoskeletal:        General: No swelling. Normal range of motion.     Cervical back: Normal range of motion and neck supple.  No cervical lymphadenopathy Skin:    General: Skin is warm and dry.     Capillary Refill: Capillary refill takes less than 2 seconds.     Findings: No rash.  Neurological:     Mental  Status: The patient is awake and alert.      ED Results / Procedures / Treatments   Labs (all labs ordered are listed, but only abnormal results are displayed) Labs Reviewed  RESP PANEL BY RT-PCR (RSV, FLU A&B, COVID)  [id number] - Abnormal; Notable for the following components:      Result Value   SARS Coronavirus 2 by RT PCR POSITIVE (*)    All other components within normal limits  GROUP A STREP BY PCR     EKG     RADIOLOGY     PROCEDURES:  Critical Care performed:   Procedures   MEDICATIONS ORDERED IN ED: Medications - No data to display   IMPRESSION / MDM / ASSESSMENT AND PLAN / ED COURSE  I reviewed the triage vital signs and the nursing notes.   Differential diagnosis includes, but is not limited to, COVID, flu, strep pharyngitis, other URI.  Patient is awake and alert, hemodynamically stable and afebrile.  She has normal oxygen saturation 100% on room air and demonstrates no increased work of breathing.  Lungs are clear to auscultation bilaterally, no fever, do not suspect pneumonia.  Swab obtained in  triage is positive for COVID-19.  We discussed this diagnosis and the fact that she is highly contagious to others.  We discussed the option of Paxlovid the patient prefers to use Tylenol/Motrin.  She requested a work note which was provided.  We discussed return precautions and importance of close outpatient follow-up.  Patient understands and agrees with plan.  She was discharged in stable condition.  Patient's presentation is most consistent with acute complicated illness / injury requiring diagnostic workup.    FINAL CLINICAL IMPRESSION(S) / ED DIAGNOSES   Final diagnoses:  COVID-19     Rx / DC Orders   ED Discharge Orders     None        Note:  This document was prepared using Dragon voice recognition software and may include unintentional dictation errors.   Keturah Shavers 05/14/22 1810    Georga Hacking, MD 05/14/22 2030

## 2022-05-14 NOTE — Discharge Instructions (Signed)
Your swab is positive for COVID-19.  Please remain in isolation for 5 days and then when you return to work, wear a mask for an additional 5 days.  You may take Tylenol and ibuprofen per package instructions to help with your symptoms.  Please return for any new, worsening, or change in symptoms or other concerns.  It was a pleasure caring for you today.

## 2022-05-14 NOTE — ED Triage Notes (Signed)
Also c/o sore throat

## 2022-05-14 NOTE — ED Triage Notes (Signed)
Arrives with C/O burning to back of eyes since last night. Continues today.  C/O nasal congestion and headache.  C/O chills

## 2022-07-16 ENCOUNTER — Other Ambulatory Visit: Payer: Self-pay

## 2022-07-16 DIAGNOSIS — R102 Pelvic and perineal pain: Secondary | ICD-10-CM

## 2022-07-16 DIAGNOSIS — N926 Irregular menstruation, unspecified: Secondary | ICD-10-CM

## 2022-07-17 ENCOUNTER — Ambulatory Visit
Admission: RE | Admit: 2022-07-17 | Discharge: 2022-07-17 | Disposition: A | Discharge status: Home / Self Care | Payer: Medicaid Other | Source: Ambulatory Visit | Attending: Family Medicine | Admitting: Family Medicine

## 2022-07-17 DIAGNOSIS — R102 Pelvic and perineal pain: Secondary | ICD-10-CM | POA: Diagnosis present

## 2022-07-17 DIAGNOSIS — N926 Irregular menstruation, unspecified: Secondary | ICD-10-CM | POA: Insufficient documentation

## 2022-08-07 ENCOUNTER — Encounter: Payer: Self-pay | Admitting: Obstetrics and Gynecology

## 2022-08-07 ENCOUNTER — Other Ambulatory Visit (HOSPITAL_COMMUNITY)
Admission: RE | Admit: 2022-08-07 | Discharge: 2022-08-07 | Disposition: A | Discharge status: Home / Self Care | Payer: Medicaid Other | Source: Ambulatory Visit | Attending: Obstetrics and Gynecology | Admitting: Obstetrics and Gynecology

## 2022-08-07 ENCOUNTER — Ambulatory Visit (INDEPENDENT_AMBULATORY_CARE_PROVIDER_SITE_OTHER): Payer: Medicaid Other | Admitting: Obstetrics and Gynecology

## 2022-08-07 VITALS — BP 100/71 | HR 83 | Resp 16 | Ht 63.0 in | Wt 178.2 lb

## 2022-08-07 DIAGNOSIS — Z01419 Encounter for gynecological examination (general) (routine) without abnormal findings: Secondary | ICD-10-CM | POA: Diagnosis present

## 2022-08-07 DIAGNOSIS — Z124 Encounter for screening for malignant neoplasm of cervix: Secondary | ICD-10-CM | POA: Insufficient documentation

## 2022-08-07 DIAGNOSIS — Z1159 Encounter for screening for other viral diseases: Secondary | ICD-10-CM

## 2022-08-07 DIAGNOSIS — D271 Benign neoplasm of left ovary: Secondary | ICD-10-CM

## 2022-08-07 DIAGNOSIS — Z872 Personal history of diseases of the skin and subcutaneous tissue: Secondary | ICD-10-CM

## 2022-08-07 DIAGNOSIS — N644 Mastodynia: Secondary | ICD-10-CM

## 2022-08-07 DIAGNOSIS — Z131 Encounter for screening for diabetes mellitus: Secondary | ICD-10-CM

## 2022-08-07 NOTE — Progress Notes (Signed)
GYNECOLOGY ANNUAL PHYSICAL EXAM PROGRESS NOTE  Subjective:   [place] Spanish Interpreter present for today's visit.   Audrey Munoz is a Hispanic 27 y.o. [phone] female who presents for an annual exam.  The patient is sexually active. The patient participates in regular exercise: no. Has the patient ever been transfused or tattooed?: yes. The patient reports that there is not domestic violence in her life.   The patient has the following complaints/concerns today. Notes that she is planning on having a breast reduction. Has been seen by the Plastic Surgeon for consultation. Requesting a mammogram to follow up on breast cysts that she was diagnosed with several years ago in [place].  Does note some tenderness of her left nipple recently.  Complains of tenderness in her right groin region. Notes that this has been ongoing for the past several weeks intermittently. Has been using an OTC cream for comfort.  Notes recently having an ultrasound performed due to pelvic pain, also with some irregular vaginal bleeding.  Results were normal with the exception of small dermoid cyst.    Menstrual History: Menarche age: 50 Patient's last menstrual period was 07/28/2022 (exact date). Period Duration (Days): 2-3 Period Pattern: (!) Irregular Menstrual Flow: Heavy Menstrual Control: Maxi pad Menstrual Control Change Freq (Hours): 2-3 Dysmenorrhea: (!) Severe Dysmenorrhea Symptoms: Cramping, Diarrhea     Gynecologic History:  Contraception: none History of STI's: Denies Last Pap: 08/22/2019. Results were: normal.  Denies h/o abnormal pap smears.     Upstream - 08/07/22 1318       Pregnancy Intention Screening   Does the patient want to become pregnant in the next year? No    Does the patient's partner want to become pregnant in the next year? No    Would the patient like to discuss contraceptive options today? No      Contraception Wrap Up   Current Method No Method - Other  Reason    End Method No Method - Other Reason    Contraception Counseling Provided No    How was the end contraceptive method provided? N/A            The pregnancy intention screening data noted above was reviewed. Potential methods of contraception were discussed. The patient elected to proceed with No Method - Other Reason.   OB History  Gravida Para Term Preterm AB Living  4 1 1  0 3 1  SAB IAB Ectopic Multiple Live Births  2 1 0 0 1    # Outcome Date GA Lbr Len/2nd Weight Sex Type Anes PTL Lv  4 SAB 02/05/21 [redacted]w[redacted]d         3 Term 02/25/19 [redacted]w[redacted]d / 05:26 7 lb 5.1 oz (3.32 kg) M CS-LTranv EPI  LIV     Birth Comments: No anomalies noted at delivery     Name: CRUZ IBANEZ,BOY Helaina     Apgar1: 9  Apgar5: 9  2 IAB           1 SAB             Past Medical History:  Diagnosis Date   Family history of ovarian cancer    8/21 cancer genetic testing letter sent   Gestational diabetes    History of gestational diabetes 04/22/2019   Hypertension    with pregnancy   Idiopathic intracranial hypertension    Seizures (HCC)    Seizures as a child with ?med   Syncope     Past Surgical History:  Procedure Laterality Date   CESAREAN SECTION N/A 02/25/2019   Procedure: CESAREAN SECTION;  Surgeon: Nadara Mustard, MD;  Location: [facility];  Service: Obstetrics;  Laterality: N/A;   FOOT GANGLION EXCISION     Cyst removed from foot   FOOT SURGERY     LAPAROSCOPIC APPENDECTOMY N/A 11/27/2021   Procedure: APPENDECTOMY LAPAROSCOPIC;  Surgeon: Henrene Dodge, MD;  Location: [facility];  Service: General;  Laterality: N/A;   LAPAROSCOPIC OVARIAN CYSTECTOMY Right 11/27/2021   Procedure: LAPAROSCOPIC OVARIAN CYSTECTOMY;  Surgeon: Henrene Dodge, MD;  Location: [facility];  Service: General;  Laterality: Right;   LAPAROSCOPIC OVARIAN CYSTECTOMY Right 11/27/2021   Procedure: LAPAROSCOPIC OVARIAN CYSTECTOMY;  Surgeon: Hildred Laser, MD;  Location: [facility];  Service: Gynecology;  Laterality: Right;    LYSIS OF ADHESION  11/27/2021   Procedure: LYSIS OF ADHESION;  Surgeon: Henrene Dodge, MD;  Location: [facility];  Service: General;;    Family History  Problem Relation Age of Onset   Asthma Father    Seizures Sister    Ovarian cancer Maternal Grandmother    Cancer - Ovarian Maternal Grandmother    Cancer Maternal Grandmother    Diabetes Mother     Social History   Socioeconomic History   Marital status: Single    Spouse name: Not on file   Number of children: Not on file   Years of education: 12   Highest education level: Not on file  Occupational History   Not on file  Tobacco Use   Smoking status: Former    Current packs/day: 0.00    Types: Cigarettes    Quit date: 07/16/2018    Years since quitting: 4.0   Smokeless tobacco: Never   Tobacco comments:    3 per day  Vaping Use   Vaping status: Never Used  Substance and Sexual Activity   Alcohol use: Not Currently    Comment: Last ETOH 03/12/18   Drug use: Never   Sexual activity: Yes    Birth control/protection: None    Comment: 01/2018 last used  Other Topics Concern   Not on file  Social History Narrative   Not on file   Social Determinants of Health   Financial Resource Strain: Low Risk  (07/15/2022)   Received from [facility], [facility]   Overall Financial Resource Strain (CARDIA)    Difficulty of Paying Living Expenses: Not hard at all  Food Insecurity: No Food Insecurity (07/15/2022)   Received from [facility], [facility]   Hunger Vital Sign    Worried About Running Out of Food in the Last Year: Never true    Ran Out of Food in the Last Year: Never true  Transportation Needs: No Transportation Needs (07/15/2022)   Received from [facility], [facility]   [facility] - Transportation    In the past 12 months, has lack of transportation kept you from medical appointments or from getting medications?:  No    Lack of Transportation (Non-Medical): No  Physical Activity: Inactive (08/04/2018)   Exercise Vital Sign    Days of Exercise per Week: 0 days    Minutes of Exercise per Session: 0 min  Stress: Not on file  Social Connections: Not on file  Intimate Partner Violence: Not on file    Current Outpatient Medications on File Prior to Visit  Medication Sig Dispense Refill   ibuprofen (ADVIL) 800 MG tablet Take 1 tablet (800 mg  total) by mouth every 8 (eight) hours as needed. 60 tablet 0   No current facility-administered medications on file prior to visit.    No Known Allergies   Review of Systems Constitutional: negative for chills, fatigue, fevers and sweats Eyes: negative for irritation, redness and visual disturbance Ears, nose, mouth, throat, and face: negative for hearing loss, nasal congestion, snoring and tinnitus Respiratory: negative for asthma, cough, sputum Cardiovascular: negative for chest pain, dyspnea, exertional chest pressure/discomfort, irregular heart beat, palpitations and syncope Gastrointestinal: negative for abdominal pain, change in bowel habits, nausea and vomiting Genitourinary: negative for abnormal menstrual periods, genital lesions, sexual problems and vaginal discharge, dysuria and urinary incontinence. Positive for right groin tenderness.  Integument/breast: negative for breast lump,and nipple discharge.  Positive for left nipple tenderness.  Hematologic/lymphatic: negative for bleeding and easy bruising Musculoskeletal:negative for back pain and muscle weakness Neurological: negative for dizziness, headaches, vertigo and weakness Endocrine: negative for diabetic symptoms including polydipsia, polyuria and skin dryness Allergic/Immunologic: negative for hay fever and urticaria      Objective:   Blood pressure 100/71, pulse 83, resp. rate 16, height 5\' 3"  (1.6 m), weight 178 lb 3.2 oz (80.8 kg), last menstrual period 07/28/2022, unknown if currently  breastfeeding.  Body mass index is 31.57 kg/m.  General Appearance:    Alert, cooperative, no distress, appears stated age, mild obesity  Head:    Normocephalic, without obvious abnormality, atraumatic  Eyes:    PERRL, conjunctiva/corneas clear, EOM's intact, both eyes  Ears:    Normal external ear canals, both ears  Nose:   Nares normal, septum midline, mucosa normal, no drainage or sinus tenderness  Throat:   Lips, mucosa, and tongue normal; teeth and gums normal  Neck:   Supple, symmetrical, trachea midline, no adenopathy; thyroid: no enlargement/tenderness/nodules; no carotid bruit or JVD  Back:     Symmetric, no curvature, ROM normal, no CVA tenderness  Lungs:     Clear to auscultation bilaterally, respirations unlabored  Chest Wall:    No tenderness or deformity   Heart:    Regular rate and rhythm, S1 and S2 normal, no murmur, rub or gallop  Breast Exam:    No tenderness, masses, or nipple abnormality  Abdomen:     Soft, non-tender, bowel sounds active all four quadrants, no masses, no organomegaly.    Genitalia:    Pelvic:external genitalia normal with exception of right groin chafing, vagina without lesions, discharge, or tenderness, rectovaginal septum  normal. Cervix normal in appearance, no cervical motion tenderness, no adnexal masses or tenderness.  Uterus normal size, shape, mobile, regular contours, nontender.  Rectal:    Normal external sphincter.  No hemorrhoids appreciated. Internal exam not done.   Extremities:   Extremities normal, atraumatic, no cyanosis or edema  Pulses:   2+ and symmetric all extremities  Skin:   Skin color, texture, turgor normal, no rashes or lesions  Lymph nodes:   Cervical, supraclavicular, and axillary nodes normal  Neurologic:   CNII-XII intact, normal strength, sensation and reflexes throughout     Labs:  Lab Results  Component Value Date   WBC 8.3 11/27/2021   HGB 10.9 (L) 11/27/2021   HCT 33.1 (L) 11/27/2021   MCV 88.0 11/27/2021   PLT  236 11/27/2021    Lab Results  Component Value Date   CREATININE 0.54 11/26/2021   BUN 9 11/26/2021   NA 136 11/26/2021   K 4.3 11/26/2021   CL 105 11/26/2021   CO2 23 11/26/2021  Lab Results  Component Value Date   ALT 14 11/26/2021   AST 16 11/26/2021   ALKPHOS 69 11/26/2021   BILITOT 0.5 11/26/2021    Lab Results  Component Value Date   TSH 0.679 10/15/2018    Imaging: US PELVIC COMPLETE WITH TRANSVAGINAL CLINICAL DATA:  Left lower quadrant pain for 6 months.  EXAM: TRANSABDOMINAL AND TRANSVAGINAL ULTRASOUND OF PELVIS  TECHNIQUE: Both transabdominal and transvaginal ultrasound examinations of the pelvis were performed. Transabdominal technique was performed for global imaging of the pelvis including uterus, ovaries, adnexal regions, and pelvic cul-de-sac. It was necessary to proceed with endovaginal exam following the transabdominal exam to visualize the adnexal structures.  COMPARISON:  Pelvic ultrasound 11/27/2021  FINDINGS: Uterus  Measurements: 6.2 x 4.0 x 5.1 cm = volume: 64.9 mL. No fibroids or other mass visualized.  Endometrium  Thickness: 8 mm.  No focal abnormality visualized.  Right ovary  Measurements: 2.7 x 2.8 x 2.6 cm = volume: 10.1 mL. Normal appearance/no adnexal mass.  Left ovary  Measurements: 1.9 x 2.1 x 2.2 cm = volume: 4.6 mL. Within the left ovary there is a 9 mm echogenic mass in an adjacent 9 mm echogenic mass.  Other findings  Trace fluid in the pelvis.  IMPRESSION: 1. There are 2 echogenic masses within the left ovary which may represent small dermoids. Recommend follow-up pelvic ultrasound in 6 months to assess for stability. 2. No acute process.  Electronically Signed   By: Annia Belt M.D.   On: 07/17/2022 13:47   Assessment:   1. Encounter for well woman exam with routine gynecological exam   2. Cervical cancer screening   3. Need for hepatitis C screening test   4. Screening for diabetes mellitus  (DM)   5. History of cyst of breast   6. Nipple tenderness   7. Dermoid cyst of left ovary      Plan:  Blood tests: Hep C screening test ordered.  Breast self exam technique reviewed and patient encouraged to perform self-exam monthly. Contraception: none. Discussed healthy lifestyle modifications. Mammogram  : Ordered for pre-operative evaluation due to h/o breast cysts, and current breast tenderness.  Pap smear  Performed today . Discussed use of hydrocortisone cream and to mix with Aquaphor for groin chafing.  Discussed findings of dermoid cyst. Advised that cysts were very small, not a likely cause of her pain. No other findings on [place].  Follow up in 1 year for annual exam   Hildred Laser, MD [facility]

## 2022-08-08 LAB — HEPATITIS C ANTIBODY: Hep C Virus Ab: NONREACTIVE

## 2022-08-11 LAB — CYTOLOGY - PAP: Diagnosis: NEGATIVE

## 2022-08-14 ENCOUNTER — Ambulatory Visit
Admission: RE | Admit: 2022-08-14 | Discharge: 2022-08-14 | Disposition: A | Discharge status: Home / Self Care | Payer: Medicaid Other | Source: Ambulatory Visit | Attending: Obstetrics and Gynecology | Admitting: Obstetrics and Gynecology

## 2022-08-14 DIAGNOSIS — N644 Mastodynia: Secondary | ICD-10-CM

## 2022-08-14 DIAGNOSIS — Z872 Personal history of diseases of the skin and subcutaneous tissue: Secondary | ICD-10-CM

## 2022-08-20 ENCOUNTER — Encounter: Payer: Self-pay | Admitting: Obstetrics and Gynecology

## 2022-08-21 ENCOUNTER — Ambulatory Visit: Payer: Medicaid Other | Admitting: Plastic Surgery

## 2022-08-21 ENCOUNTER — Encounter: Payer: Self-pay | Admitting: Plastic Surgery

## 2022-08-21 VITALS — BP 111/68 | HR 84 | Ht 63.0 in | Wt 181.8 lb

## 2022-08-21 DIAGNOSIS — N62 Hypertrophy of breast: Secondary | ICD-10-CM | POA: Diagnosis not present

## 2022-08-21 DIAGNOSIS — Z6832 Body mass index (BMI) 32.0-32.9, adult: Secondary | ICD-10-CM

## 2022-08-21 DIAGNOSIS — M546 Pain in thoracic spine: Secondary | ICD-10-CM | POA: Diagnosis not present

## 2022-08-21 DIAGNOSIS — M542 Cervicalgia: Secondary | ICD-10-CM | POA: Diagnosis not present

## 2022-08-21 NOTE — Progress Notes (Signed)
Referring Provider No referring provider defined for this encounter.   CC:  Chief Complaint  Patient presents with   Consult      Audrey Munoz is an 27 y.o. female.  HPI: [name] is a 27 year old female who presents today with complaints of upper back and neck pain which she states have worsened over the past year.  She states that nothing has changed specifically other than her breast have gotten larger.  She has been seen for breast pain and had an ultrasound performed earlier this year which was read as BI-RADS 1 with no specific findings and recommendations for general management of breast pain.  She does complain of her bra straps digging into her shoulders and has grooves in her shoulders from her bra straps.  She is requesting a breast reduction.  No Known Allergies  Outpatient Encounter Medications as of 08/21/2022  Medication Sig   Vitamin D, Ergocalciferol, (DRISDOL) 1.25 MG (50000 UNIT) CAPS capsule Take 50,000 Units by mouth once a week.   No facility-administered encounter medications on file as of 08/21/2022.     Past Medical History:  Diagnosis Date   Family history of ovarian cancer    8/21 cancer genetic testing letter sent   Gestational diabetes    History of gestational diabetes 04/22/2019   Hypertension    with pregnancy   Idiopathic intracranial hypertension    Seizures (HCC)    Seizures as a child with ?med   Syncope     Past Surgical History:  Procedure Laterality Date   CESAREAN SECTION N/A 02/25/2019   Procedure: CESAREAN SECTION;  Surgeon: Nadara Mustard, MD;  Location: [facility];  Service: Obstetrics;  Laterality: N/A;   FOOT GANGLION EXCISION     Cyst removed from foot   FOOT SURGERY     LAPAROSCOPIC APPENDECTOMY N/A 11/27/2021   Procedure: APPENDECTOMY LAPAROSCOPIC;  Surgeon: Henrene Dodge, MD;  Location: [facility];  Service: General;  Laterality: N/A;   LAPAROSCOPIC OVARIAN CYSTECTOMY Right 11/27/2021   Procedure: LAPAROSCOPIC OVARIAN  CYSTECTOMY;  Surgeon: Henrene Dodge, MD;  Location: [facility];  Service: General;  Laterality: Right;   LAPAROSCOPIC OVARIAN CYSTECTOMY Right 11/27/2021   Procedure: LAPAROSCOPIC OVARIAN CYSTECTOMY;  Surgeon: Hildred Laser, MD;  Location: [facility];  Service: Gynecology;  Laterality: Right;   LYSIS OF ADHESION  11/27/2021   Procedure: LYSIS OF ADHESION;  Surgeon: Henrene Dodge, MD;  Location: [facility];  Service: General;;    Family History  Problem Relation Age of Onset   Asthma Father    Seizures Sister    Ovarian cancer Maternal Grandmother    Cancer - Ovarian Maternal Grandmother    Cancer Maternal Grandmother    Diabetes Mother     Social History   Social History Narrative   Not on file     Review of Systems General: Denies fevers, chills, weight loss CV: Denies chest pain, shortness of breath, palpitations Breast: Only specific complaint is breast pain and she has been evaluated with an ultrasound for this with no findings.  She does complain of upper back and neck pain which she attributes to the large size of her breast  Physical Exam    08/21/2022    9:39 AM 08/07/2022    1:19 PM 05/14/2022    4:41 PM  Vitals with BMI  Height 5\' 3"  5\' 3"    Weight 181 lbs 13 oz 178 lbs 3 oz   BMI 32.21 31.57   Systolic 111 100 443  Diastolic 68 71 68  Pulse 84 83 101    General:  No acute distress,  Alert and oriented, Non-Toxic, Normal speech and affect Breast: Patient has large pendulous breast with grade 3 ptosis.  There are no dominant masses on physical exam and the nipples are normal in appearance without evidence of nipple discharge.  Her sternal notch to nipple distance is 32 cm on the right and 35 cm on the left her nipple to fold distance is 20 cm on the right and 20 cm on the left. Mammogram: She reports that she had a mammogram several years ago in [place] but was not told anything about it.  As noted above she had an ultrasound which was BI-RADS 1 this was performed within the  past month. Assessment/Plan Macromastia: The entire interview today was conducted with the assistance of a Spanish interpreter.  I believe I can take 500 g per breast.  I discussed breast reductions at length with the patient including the location of the incisions and the unpredictable nature of scarring.  We discussed the risks of bleeding, infection, and seroma formation she understands I will use drains postoperatively.  We discussed the risk of nipple loss due to nipple ischemia.  We discussed the postoperative limitations including no heavy lifting greater than 20 pounds, no vigorous activity, and no submerging the incisions in water for 6 weeks.  She will be able to return to light activity as tolerated after surgery and will be able to drive when she has been off narcotic pain medicine for 24 hours.  She is encouraged to begin ambulation immediately after surgery to help prevent DVT.  She understands that no specific cup size can be assured nor is it guaranteed.  All questions were answered to her satisfaction.  Photographs were obtained today with her consent.  Will submit her for a bilateral breast reduction at her request.  Santiago Glad 08/21/2022, 10:05 AM

## 2022-09-04 ENCOUNTER — Other Ambulatory Visit: Payer: Self-pay | Admitting: Family Medicine

## 2022-09-04 DIAGNOSIS — M542 Cervicalgia: Secondary | ICD-10-CM

## 2022-09-25 ENCOUNTER — Other Ambulatory Visit: Payer: Medicaid Other

## 2022-09-25 NOTE — Telephone Encounter (Signed)
Patient notified via mychart of denial from [facility]. Med [facility] only for cancer, trauma, or previous breast surgery. For this reason we will not appeal.

## 2022-10-17 ENCOUNTER — Institutional Professional Consult (permissible substitution): Payer: Medicaid Other | Admitting: Plastic Surgery

## 2022-10-30 ENCOUNTER — Ambulatory Visit
Admission: RE | Admit: 2022-10-30 | Discharge: 2022-10-30 | Disposition: A | Discharge status: Home / Self Care | Payer: Medicaid Other | Source: Ambulatory Visit | Attending: Family Medicine | Admitting: Family Medicine

## 2022-10-30 DIAGNOSIS — M542 Cervicalgia: Secondary | ICD-10-CM

## 2022-12-02 ENCOUNTER — Other Ambulatory Visit: Payer: Self-pay

## 2022-12-02 ENCOUNTER — Emergency Department
Admission: EM | Admit: 2022-12-02 | Discharge: 2022-12-02 | Disposition: A | Discharge status: Home / Self Care | Payer: Medicaid Other | Attending: Emergency Medicine | Admitting: Emergency Medicine

## 2022-12-02 DIAGNOSIS — I10 Essential (primary) hypertension: Secondary | ICD-10-CM | POA: Insufficient documentation

## 2022-12-02 DIAGNOSIS — R200 Anesthesia of skin: Secondary | ICD-10-CM | POA: Diagnosis present

## 2022-12-02 DIAGNOSIS — R202 Paresthesia of skin: Secondary | ICD-10-CM | POA: Diagnosis not present

## 2022-12-02 NOTE — ED Provider Notes (Signed)
   [facility] Provider Note    Event Date/Time   First MD Initiated Contact with Patient 12/02/22 1245     (approximate)   History   Numbness   HPI  Audrey Munoz is a 27 y.o. female with history of seizures, hypertension, syncope presents emergency department complaining of numbness and tingling.  Patient was prescribed Diamox by her neurologist for headaches.  She has been having numbness and tingling in her hands since taking the medication.  Patient did call her neurologist but was not able to get an appointment.  Stop taking the medication yesterday      Physical Exam   Triage Vital Signs: ED Triage Vitals  Encounter Vitals Group     BP 12/02/22 1233 135/68     Systolic BP Percentile --      Diastolic BP Percentile --      Pulse Rate 12/02/22 1233 68     Resp 12/02/22 1233 17     Temp 12/02/22 1233 97.9 F (36.6 C)     Temp Source 12/02/22 1233 Oral     SpO2 12/02/22 1233 98 %     Weight 12/02/22 1233 176 lb (79.8 kg)     Height 12/02/22 1233 5\' 3"  (1.6 m)     Head Circumference --      Peak Flow --      Pain Score 12/02/22 1234 5     Pain Loc --      Pain Education --      Exclude from Growth Chart --     Most recent vital signs: Vitals:   12/02/22 1233  BP: 135/68  Pulse: 68  Resp: 17  Temp: 97.9 F (36.6 C)  SpO2: 98%     General: Awake, no distress.   CV:  Good peripheral perfusion. regular rate and  rhythm Resp:  Normal effort.  Abd:  No distention.   Other:  Cranial nerves II through XII grossly intact   ED Results / Procedures / Treatments   Labs (all labs ordered are listed, but only abnormal results are displayed) Labs Reviewed - No data to display   EKG     RADIOLOGY     PROCEDURES:   Procedures   MEDICATIONS ORDERED IN ED: Medications - No data to display   IMPRESSION / MDM / ASSESSMENT AND PLAN / ED COURSE  I reviewed the triage vital signs and the nursing notes.                               Differential diagnosis includes, but is not limited to, medication reaction, headache, numbness  Patient's presentation is most consistent with acute, uncomplicated illness.   Patient's exam is benign. Diamox has a common side effect of numbness and tingling.  I did explain this to the patient.  She is to follow-up with her neurologist tomorrow.  She is in agreement treatment plan.  Discharged stable condition with work note.      FINAL CLINICAL IMPRESSION(S) / ED DIAGNOSES   Final diagnoses:  Numbness     Rx / DC Orders   ED Discharge Orders     None        Note:  This document was prepared using Dragon voice recognition software and may include unintentional dictation errors.    Faythe Ghee, PA-C 12/02/22 1603    Jene Every, MD 12/09/22 [phone]

## 2022-12-02 NOTE — ED Triage Notes (Signed)
Pt advises that she was prescribed Diamox by her neurologist. Pt sts that she was prescribed that for headaches. Pt sts that she has been having numbness in her hands and feet and red eyes.

## 2023-01-05 ENCOUNTER — Inpatient Hospital Stay
Admission: RE | Admit: 2023-01-05 | Discharge: 2023-01-05 | Disposition: A | Discharge status: Home / Self Care | Payer: Self-pay | Source: Ambulatory Visit | Attending: Neurosurgery | Admitting: Neurosurgery

## 2023-01-05 ENCOUNTER — Other Ambulatory Visit: Payer: Self-pay | Admitting: Family Medicine

## 2023-01-05 DIAGNOSIS — Z049 Encounter for examination and observation for unspecified reason: Secondary | ICD-10-CM

## 2023-01-07 NOTE — Progress Notes (Unsigned)
Referring Physician:  Alm Bustard, NP [address] [place],  [place] [address]  Primary Physician:  Patient, No Pcp Per  History of Present Illness: 01/08/2023 Audrey Munoz is here today with a chief complaint of pain in her shoulders that has been ongoing for some time.  She has been evaluated for breast reduction, and is felt to be an appropriate candidate for this.  She comes in today for evaluation of the possible cervical cause for her neck and arm pain.  She also has 1 day history of electrical pain in her right leg.  Conservative measures:  Physical therapy:  has had PT in August was discharged but it did not help Multimodal medical therapy including regular antiinflammatories:  tramadol, flexeril, prednisone, robaxin Injections:   12/22/2022: bilateral T10 trigger point injection   Past Surgery:   Burgess Estelle has no symptoms of cervical myelopathy.  The symptoms are causing a significant impact on the patient's life.   I have utilized the care everywhere function in epic to review the outside records available from external health systems.  Review of Systems:  A 10 point review of systems is negative, except for the pertinent positives and negatives detailed in the HPI.  Past Medical History: Past Medical History:  Diagnosis Date   Family history of ovarian cancer    8/21 cancer genetic testing letter sent   Gestational diabetes    History of gestational diabetes 04/22/2019   Hypertension    with pregnancy   Idiopathic intracranial hypertension    Seizures (HCC)    Seizures as a child with ?med   Syncope     Past Surgical History: Past Surgical History:  Procedure Laterality Date   CESAREAN SECTION N/A 02/25/2019   Procedure: CESAREAN SECTION;  Surgeon: Nadara Mustard, MD;  Location: [facility];  Service: Obstetrics;  Laterality: N/A;   FOOT GANGLION EXCISION     Cyst removed from foot   FOOT SURGERY     LAPAROSCOPIC  APPENDECTOMY N/A 11/27/2021   Procedure: APPENDECTOMY LAPAROSCOPIC;  Surgeon: Henrene Dodge, MD;  Location: [facility];  Service: General;  Laterality: N/A;   LAPAROSCOPIC OVARIAN CYSTECTOMY Right 11/27/2021   Procedure: LAPAROSCOPIC OVARIAN CYSTECTOMY;  Surgeon: Henrene Dodge, MD;  Location: [facility];  Service: General;  Laterality: Right;   LAPAROSCOPIC OVARIAN CYSTECTOMY Right 11/27/2021   Procedure: LAPAROSCOPIC OVARIAN CYSTECTOMY;  Surgeon: Hildred Laser, MD;  Location: [facility];  Service: Gynecology;  Laterality: Right;   LYSIS OF ADHESION  11/27/2021   Procedure: LYSIS OF ADHESION;  Surgeon: Henrene Dodge, MD;  Location: [facility];  Service: General;;    Allergies: Allergies as of 01/08/2023   (No Known Allergies)    Medications: No current outpatient medications on file.  Social History: Social History   Tobacco Use   Smoking status: Former    Current packs/day: 0.00    Types: Cigarettes    Quit date: 07/16/2018    Years since quitting: 4.4   Smokeless tobacco: Never   Tobacco comments:    3 per day  Vaping Use   Vaping status: Never Used  Substance Use Topics   Alcohol use: Not Currently    Comment: Last ETOH 03/12/18   Drug use: Never    Family Medical History: Family History  Problem Relation Age of Onset   Asthma Father    Seizures Sister    Ovarian cancer Maternal Grandmother    Cancer - Ovarian Maternal Grandmother    Cancer  Maternal Grandmother    Diabetes Mother     Physical Examination: Vitals:   01/08/23 1106  BP: 112/66    General: Patient is in no apparent distress. Attention to examination is appropriate.  Neck:   Supple.  Full range of motion with pulling into her left trapezius muscle when she rotates her neck.  Respiratory: Patient is breathing without any difficulty.   NEUROLOGICAL:     Awake, alert, oriented to person, place, and time.  Speech is clear and fluent.   Cranial Nerves: Pupils equal round and reactive to light.  Facial  tone is symmetric.  Facial sensation is symmetric. Shoulder shrug is symmetric. Tongue protrusion is midline.  There is no pronator drift.  Strength: Side Biceps Triceps Deltoid Interossei Grip Wrist Ext. Wrist Flex.  R 5 5 5 5 5 5 5   L 5 5 5 5 5 5 5    Side Iliopsoas Quads Hamstring PF DF EHL  R 5 5 5 5 5 5   L 5 5 5 5 5 5    Reflexes are 1+ and symmetric at the biceps, triceps, brachioradialis, patella and achilles.   Hoffman's is absent.   Bilateral upper and lower extremity sensation is intact to light touch.    No evidence of dysmetria noted.  Gait is normal.     Medical Decision Making  Imaging: MRI CL spine 12/12/2022 FINDINGS:  Alignment: Reversal of the normal cervical lordosis with apex at C4-5. No  significant listhesis.  Bone marrow Signal: No suspicious lesions.  Spinal cord: No compression. Normal in size and signal.   Paraspinal Soft Tissues: Unremarkable.   Occiput-C1: No significant degenerative change.  Atlanto-dental interval: normal  C1-2 lateral masses: no significant degenerative change   C2-C3: No significant stenosis.  C3-C4: Tiny central disc protrusion without significant stenosis.  C4-C5: Tiny central disc protrusion without significant stenosis.  C5-C6: No significant stenosis.  C6-C7: No significant stenosis.  C7-T1: No significant stenosis.    IMPRESSION:  Mild degenerative change of the cervical spine without high-grade stenosis.   T12-L1: Unremarkable.    L1-L2: Unremarkable.  L2-L3: Unremarkable.    L3-L4: Unremarkable.    L4-L5: Unremarkable.    L5-S1: Small posterior central disc protrusion. No significant stenosis.      IMPRESSION:  Small posterior disc protrusion at L5-S1 without significant stenosis.   Electronically Signed by:  Grafton Folk, MD, [facility]  Electronically Signed on:  12/12/2022 5:13 PM   I have personally reviewed the images and agree with the above interpretation.  Assessment and Plan: Ms.  Audrey Munoz is a pleasant 27 y.o. female with neck pain that appears to be secondary to her bra strap.  I think she should be considered for breast reduction.  There is no cause of her neck pain on her MRI scan.  The disc protrusions that she is suffering from are insignificant both in her cervical and lumbar spine.  I have recommended that she move forward with breast reduction.  We will schedule a follow-up appointment approximately 6 weeks after her breast reduction to determine whether further intervention in her lower back is necessary.  I think it is extremely unlikely she will need surgical intervention, but may benefit from injections if she does not show improvements after her breast reduction.   I spent a total of 30 minutes in this patient's care today. This time was spent reviewing pertinent records including imaging studies, obtaining and confirming history, performing a directed evaluation, formulating and discussing my recommendations, and  documenting the visit within the medical record.      Thank you for involving me in the care of this patient.      Jarrel Knoke K. Myer Haff MD, [facility]

## 2023-01-08 ENCOUNTER — Ambulatory Visit: Payer: Medicaid Other | Admitting: Neurosurgery

## 2023-01-08 ENCOUNTER — Encounter: Payer: Self-pay | Admitting: Neurosurgery

## 2023-01-08 VITALS — BP 112/66 | Ht 63.0 in | Wt 176.0 lb

## 2023-01-08 DIAGNOSIS — M5441 Lumbago with sciatica, right side: Secondary | ICD-10-CM

## 2023-01-08 DIAGNOSIS — M542 Cervicalgia: Secondary | ICD-10-CM | POA: Diagnosis not present

## 2023-02-11 ENCOUNTER — Telehealth: Payer: Self-pay

## 2023-02-11 NOTE — Telephone Encounter (Signed)
 Using interpreter services, a voicemail was left for the patient to call us  to verify if she has the same insurance information from previous visit or has it changed.

## 2023-02-12 ENCOUNTER — Ambulatory Visit: Payer: Commercial Managed Care - PPO | Admitting: Plastic Surgery

## 2023-02-12 DIAGNOSIS — N62 Hypertrophy of breast: Secondary | ICD-10-CM | POA: Diagnosis not present

## 2023-02-12 NOTE — Progress Notes (Signed)
[name] returns today to discuss her breast reduction denial.  She has recently changed insurance and would like to have the request resubmitted to the new insurance company.  We will resubmit for her and proceed based on authorization.

## 2023-02-20 ENCOUNTER — Emergency Department: Payer: Commercial Managed Care - PPO

## 2023-02-20 ENCOUNTER — Other Ambulatory Visit: Payer: Self-pay

## 2023-02-20 ENCOUNTER — Emergency Department
Admission: EM | Admit: 2023-02-20 | Discharge: 2023-02-20 | Disposition: A | Discharge status: Home / Self Care | Payer: Commercial Managed Care - PPO | Attending: Emergency Medicine | Admitting: Emergency Medicine

## 2023-02-20 DIAGNOSIS — M542 Cervicalgia: Secondary | ICD-10-CM | POA: Diagnosis present

## 2023-02-20 LAB — SEDIMENTATION RATE: Sed Rate: 20 mm/h (ref 0–20)

## 2023-02-20 MED ORDER — CYCLOBENZAPRINE HCL 10 MG PO TABS
10.0000 mg | ORAL_TABLET | Freq: Three times a day (TID) | ORAL | 0 refills | Status: DC | PRN
Start: 2023-02-20 — End: 2023-07-09

## 2023-02-20 MED ORDER — OXYCODONE-ACETAMINOPHEN 5-325 MG PO TABS: 1.0000 | ORAL_TABLET | Freq: Three times a day (TID) | ORAL | 0 refills | Status: AC | PRN

## 2023-02-20 NOTE — ED Triage Notes (Addendum)
Pt to ED via POV from [facility]. Pt reports left sided facial pain, tingling of head and left eye double vision that started on Wednesday morning. Pt with hx of intracranial hypertension. PA in triage for orders

## 2023-02-20 NOTE — ED Provider Triage Note (Signed)
Emergency Medicine Provider Triage Evaluation Note  Audrey Munoz , a 28 y.o. female  was evaluated in triage.  Pt complains of left sided facial pain and blurry vision that began on Wednesday. Numbness and tingling on the left side of the face also began on Wednesday. Denies eye trauma. No contact use.  Hx of idiopathic intracranial hypertension.  Review of Systems  Positive: Facial pain, left sided blurry vision, left eye pain Negative:   Physical Exam  There were no vitals taken for this visit. Gen:   Awake, no distress   Resp:  Normal effort  MSK:   Moves extremities without difficulty  Other:    Medical Decision Making  Medically screening exam initiated at 1:43 PM.  Appropriate orders placed.  Audrey Munoz was informed that the remainder of the evaluation will be completed by another provider, this initial triage assessment does not replace that evaluation, and the importance of remaining in the ED until their evaluation is complete.     Audrey Ali, PA-C 02/20/23 1348

## 2023-02-20 NOTE — ED Provider Notes (Signed)
[facility] Provider Note    Event Date/Time   First MD Initiated Contact with Patient 02/20/23 1522     (approximate)   History   Eye Problem and Facial Pain   HPI  Audrey Munoz is a 28 y.o. female  with history of 3 days of cephalea, left ocular pain, left eye blurred vision, tenderness in left temporal area. Patient with history of Pseudotumor cerebri. Patient states seeing neurology for back pain.      Physical Exam   Triage Vital Signs: ED Triage Vitals [02/20/23 1346]  Encounter Vitals Group     BP 121/75     Systolic BP Percentile      Diastolic BP Percentile      Pulse Rate 69     Resp 20     Temp 98 F (36.7 C)     Temp Source Oral     SpO2 98 %     Weight      Height      Head Circumference      Peak Flow      Pain Score 4     Pain Loc      Pain Education      Exclude from Growth Chart     Most recent vital signs: Vitals:   02/20/23 1346  BP: 121/75  Pulse: 69  Resp: 20  Temp: 98 F (36.7 C)  SpO2: 98%     Constitutional: Alert, non distress Eyes: Conjunctivae are normal.  Head: Atraumatic, tender to palpation on left temporal and coronary occipital area.. Nose: No congestion/rhinnorhea. Mouth/Throat: Mucous membranes are moist.   Neck: Painless ROM.  Cardiovascular:   Good peripheral circulation. Respiratory: Normal respiratory effort.  No retractions.  Gastrointestinal: Soft and nontender.  Musculoskeletal:  no deformity Neurologic:  MAE spontaneously. No gross focal neurologic deficits are appreciated. Left arm decreased strength 4/5, decreased sensation. Pulses positive.   Skin:  Skin is warm, dry and intact. No rash noted. Psychiatric: Mood and affect are normal. Speech and behavior are normal.    ED Results / Procedures / Treatments   Labs (all labs ordered are listed, but only abnormal results are displayed) Labs Reviewed  SEDIMENTATION RATE     EKG    RADIOLOGY I independently  reviewed and interpreted imaging and agree with radiologists findings.      PROCEDURES:  Critical Care performed:   Procedures   MEDICATIONS ORDERED IN ED: Medications - No data to display   IMPRESSION / MDM / ASSESSMENT AND PLAN / ED COURSE  I reviewed the triage vital signs and the nursing notes.  Differential diagnosis includes, but is not limited to, temporal artery vasculitis, epidural hematoma, migraine, cervical radiculopathy, pseudo tumor cerebri.   Patient's presentation is most consistent with acute complicated illness / injury requiring diagnostic workup.   Patient's diagnosis is consistent with cervicalgia. I independently reviewed and interpreted imaging and agree with radiologists findings. Labs are rea reassuring. I did review the patient's allergies and medications. Patient will be discharged home with prescriptions for Paxil, oxycodone. Patient is to follow up with neurology as needed or otherwise directed. Patient is given ED precautions to return to the ED for any worsening or new symptoms. Discussed plan of care with patient, answered all of patient's questions, Patient agreeable to plan of care. Advised patient to take medications according to the instructions on the label. Discussed possible side effects of new medications. Patient verbalized understanding. Clinical Course as of 02/20/23 1700  Fri Feb 20, 2023  1644 Sed Rate: 20 Within normal limits [AE]  1700 Sedimentation rate Within normal limits [AE]  1700 CT Cervical Spine Wo Contrast [AE]  1700  No acute fracture or traumatic listhesis. 2. No advanced degenerative changes in the cervical spine.   [AE]    Clinical Course User Index [AE] Gladys Damme, PA-C     FINAL CLINICAL IMPRESSION(S) / ED DIAGNOSES   Final diagnoses:  Cervicalgia     Rx / DC Orders   ED Discharge Orders          Ordered    cyclobenzaprine (FLEXERIL) 10 MG tablet  3 times daily PRN        02/20/23 1651     oxyCODONE-acetaminophen (PERCOCET) 5-325 MG tablet  Every 8 hours PRN        02/20/23 1651             Note:  This document was prepared using Dragon voice recognition software and may include unintentional dictation errors.   Gladys Damme, PA-C 02/20/23 1701    Jene Every, MD 02/20/23 1747

## 2023-02-20 NOTE — Discharge Instructions (Signed)
You have been diagnosed with cervicalgia, please take Flexeril 3 times per day after main meals.  Please take Percocet every 8 hours for pain.  Please drink plenty of fluids.  Please go to your appointment with neurology on Tuesday.

## 2023-03-02 ENCOUNTER — Other Ambulatory Visit: Payer: Self-pay | Admitting: Neurology

## 2023-03-02 DIAGNOSIS — G932 Benign intracranial hypertension: Secondary | ICD-10-CM

## 2023-03-02 DIAGNOSIS — H5712 Ocular pain, left eye: Secondary | ICD-10-CM

## 2023-03-02 DIAGNOSIS — H538 Other visual disturbances: Secondary | ICD-10-CM

## 2023-03-17 ENCOUNTER — Telehealth: Payer: Self-pay | Admitting: Plastic Surgery

## 2023-03-17 NOTE — Telephone Encounter (Signed)
 Patient would like to see if there is any earlier date for surgery, will need interpreter

## 2023-03-20 ENCOUNTER — Ambulatory Visit
Admission: RE | Admit: 2023-03-20 | Discharge: 2023-03-20 | Disposition: A | Discharge status: Home / Self Care | Payer: Commercial Managed Care - PPO | Source: Ambulatory Visit | Attending: Neurology | Admitting: Neurology

## 2023-03-20 DIAGNOSIS — G932 Benign intracranial hypertension: Secondary | ICD-10-CM

## 2023-03-20 DIAGNOSIS — H5712 Ocular pain, left eye: Secondary | ICD-10-CM

## 2023-03-20 DIAGNOSIS — H538 Other visual disturbances: Secondary | ICD-10-CM

## 2023-03-20 MED ORDER — GADOPICLENOL 0.5 MMOL/ML IV SOLN
7.5000 mL | Freq: Once | INTRAVENOUS | Status: AC | PRN
  Administered 2023-03-20: 7.5 mL via INTRAVENOUS

## 2023-04-21 ENCOUNTER — Encounter: Payer: Self-pay | Admitting: Surgical

## 2023-04-21 ENCOUNTER — Encounter: Payer: Commercial Managed Care - PPO | Admitting: Surgical

## 2023-04-21 ENCOUNTER — Ambulatory Visit (INDEPENDENT_AMBULATORY_CARE_PROVIDER_SITE_OTHER): Admitting: Surgical

## 2023-04-21 VITALS — BP 108/62 | HR 56 | Ht 63.0 in | Wt 174.2 lb

## 2023-04-21 DIAGNOSIS — N62 Hypertrophy of breast: Secondary | ICD-10-CM

## 2023-04-21 DIAGNOSIS — F17291 Nicotine dependence, other tobacco product, in remission: Secondary | ICD-10-CM

## 2023-04-21 NOTE — Progress Notes (Signed)
 Patient ID: Audrey Munoz, female    DOB: [birth year], 28 y.o.   MRN: [id number]  Chief Complaint  Patient presents with   pre op      ICD-10-CM   1. 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  female  with a history of macromastia.  She presents for preoperative evaluation for upcoming procedure, Bilateral Breast Reduction, scheduled for 05/05/2023 with Dr.  Ladona Ridgel.  She is presents today with family and Spanish interpreter.  The patient has not had problems with anesthesia. No history of DVT/PE.  No family history of DVT/PE.  No family or personal history of bleeding or clotting disorders.  Patient is not currently taking any blood thinners.  No history of CVA/MI.  Patient denies any cardiac or pulmonary disease.  She was seen at [facility] clinic today for evaluation of her chronic back pain.  She also reports that when she is having chronic back pain she has noticed some chest pain associated with this.  She reports that she had an EKG today which was normal.  Per EMR review she presented with spinal pain, burning up and down the center and across the scapular areas.  She reported pressure and tightness that was worse when wearing a bra.  Better when not wearing a bra and resting.  She denied chest pain at the visit.  She was prescribed prednisone and Skelaxin today.  5 days of prednisone.  EKG showed NSR.  Summary of Previous Visit: STN 32 cm on the right, 35 cm on the left.  Nipple to fold 20 cm bilaterally.  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  Provided patient with letter today.  PMH Significant for: Idiopathic intracranial hypertension, hypertension, possible OSA (patient referred for sleep  study by neurology, but patient reports she has not done that test).  History of gestational diabetes.  She reports that she has tolerated surgery well in the past.  She is a former smoker, quit about 1 year ago.  She does report that she uses nicotine vapes, but stopped using these about 4 weeks ago.  She will be 6 weeks without nicotine at surgical date.  She is aware that she will need to complete a nicotine test.  Past Medical History: Allergies: No Known Allergies  Current Medications:  Current Outpatient Medications:    cyclobenzaprine (FLEXERIL) 10 MG tablet, Take 1 tablet (10 mg total) by mouth 3 (three) times daily as needed for muscle spasms., Disp: 30 tablet, Rfl: 0  Past Medical Problems: Past Medical History:  Diagnosis Date   Family history of ovarian cancer    8/21 cancer genetic testing letter sent   Gestational diabetes    History of gestational diabetes 04/22/2019   Hypertension    with pregnancy   Idiopathic intracranial hypertension    Seizures (HCC)    Seizures as a child with ?med   Syncope     Past Surgical History: Past Surgical History:  Procedure Laterality Date   CESAREAN SECTION N/A 02/25/2019   Procedure: CESAREAN SECTION;  Surgeon: Nadara Mustard, MD;  Location: [facility] ORS;  Service: Obstetrics;  Laterality: N/A;   FOOT GANGLION EXCISION     Cyst  removed from foot   FOOT SURGERY     LAPAROSCOPIC APPENDECTOMY N/A 11/27/2021   Procedure: APPENDECTOMY LAPAROSCOPIC;  Surgeon: Henrene Dodge, MD;  Location: [facility] ORS;  Service: General;  Laterality: N/A;   LAPAROSCOPIC OVARIAN CYSTECTOMY Right 11/27/2021   Procedure: LAPAROSCOPIC OVARIAN CYSTECTOMY;  Surgeon: Henrene Dodge, MD;  Location: [facility] ORS;  Service: General;  Laterality: Right;   LAPAROSCOPIC OVARIAN CYSTECTOMY Right 11/27/2021   Procedure: LAPAROSCOPIC OVARIAN CYSTECTOMY;  Surgeon: Hildred Laser, MD;  Location: [facility] ORS;  Service: Gynecology;  Laterality: Right;   LYSIS OF ADHESION  11/27/2021    Procedure: LYSIS OF ADHESION;  Surgeon: Henrene Dodge, MD;  Location: [facility] ORS;  Service: General;;    Social History: Social History   Socioeconomic History   Marital status: Single    Spouse name: Not on file   Number of children: Not on file   Years of education: 12   Highest education level: Not on file  Occupational History   Not on file  Tobacco Use   Smoking status: Former    Current packs/day: 0.00    Types: Cigarettes    Quit date: 07/16/2018    Years since quitting: 4.7   Smokeless tobacco: Never   Tobacco comments:    3 per day  Vaping Use   Vaping status: Never Used  Substance and Sexual Activity   Alcohol use: Not Currently    Comment: Last ETOH 03/12/18   Drug use: Never   Sexual activity: Yes    Birth control/protection: None    Comment: 01/2018 last used  Other Topics Concern   Not on file  Social History Narrative   Not on file   Social Drivers of Health   Financial Resource Strain: High Risk (12/17/2022)   Received from [facility]   Overall Financial Resource Strain (CARDIA)    Difficulty of Paying Living Expenses: Very hard  Food Insecurity: Food Insecurity Present (12/17/2022)   Received from [facility]   Hunger Vital Sign    Ran Out of Food in the Last Year: Sometimes true    Worried About Running Out of Food in the Last Year: Sometimes true  Transportation Needs: No Transportation Needs (12/17/2022)   Received from [facility]   PRAPARE - Transportation    Lack of Transportation (Non-Medical): No    In the past 12 months, has lack of transportation kept you from medical appointments or from getting medications?: No  Physical Activity: Insufficiently Active (09/12/2022)   Received from [facility]   Exercise Vital Sign    Days of Exercise per Week: 7 days    Minutes of Exercise per Session: 20 min  Stress: Stress Concern Present (09/16/2022)   Received from [facility] - Occupational Stress Questionnaire    Feeling of Stress : Very much  Social Connections: Socially Isolated (09/12/2022)   Received from [facility]   Social Connection and Isolation Panel [NHANES]    Frequency of Communication with Friends and Family: Twice a week    Frequency of Social Gatherings with Friends and Family: Never    Attends Religious Services: Never    Database administrator or Organizations: No    Attends Banker Meetings: Never    Marital Status: Never married  Catering manager Violence: Not on file    Family History: Family History  Problem Relation Age of Onset   Asthma  Father    Seizures Sister    Ovarian cancer Maternal Grandmother    Cancer - Ovarian Maternal Grandmother    Cancer Maternal Grandmother    Diabetes Mother     Review of Systems: Review of Systems  Constitutional: Negative.   Respiratory: Negative.    Cardiovascular: Negative.   Gastrointestinal: Negative.   Musculoskeletal:  Positive for back pain and neck pain.    Physical Exam: Vital Signs BP 108/62 (BP Location: Right Arm, Patient Position: Sitting, Cuff Size: Large)   Pulse (!) 56   Ht 5\' 3"  (1.6 m)   Wt 174 lb 3.2 oz (79 kg)   SpO2 100%   BMI 30.86 kg/m   Physical Exam Constitutional:      General: Not in acute distress.    Appearance: Normal appearance. Not ill-appearing.  HENT:     Head: Normocephalic and atraumatic.  Eyes:     Pupils: Pupils are equal, round Neck:     Musculoskeletal: Normal range of motion.  Cardiovascular:     Rate and Rhythm: Normal rate    Pulses: Normal pulses.  Pulmonary:     Effort: Pulmonary effort is normal. No respiratory distress.  Musculoskeletal: Normal range of motion.  Skin:    General: Skin is warm and dry.     Findings: No erythema or rash.  Neurological:     General: No focal deficit present.     Mental Status: Alert and  oriented to person, place, and time. Mental status is at baseline.     Motor: No weakness.  Psychiatric:        Mood and Affect: Mood normal.        Behavior: Behavior normal.    Assessment/Plan: The patient is scheduled for bilateral breast reduction with Dr. Ladona Ridgel.  Risks, benefits, and alternatives of procedure discussed, questions answered and consent obtained.    Smoking Status: Quit smoking cigarettes 1 year ago, quit smoking nicotine vape 4 weeks ago.; Counseling Given?  Discussed increased risk of postoperative complications including nipple areolar loss and delayed wound healing with use of nicotine.  Provided patient with nicotine cotinine test today.  Discussed with patient she will need to complete this prior to surgery, discussed with patient she will need to complete that this week in order for her to result prior to surgery.  Last Mammogram: Patient did have an ultrasound of her left breast in July 2024 which was BI-RADS 1 negative.  Caprini Score: 3, moderate; Risk Factors include: BMI > 25, and length of planned surgery. Recommendation for mechanical prophylaxis. Encourage early ambulation.   Pictures obtained: @consult   Post-op Rx sent to pharmacy: Oxycodone, Zofran  Patient was provided with the breast reduction and General Surgical Risk consent document and Pain Medication Agreement prior to their appointment.  They had adequate time to read through the risk consent documents and Pain Medication Agreement. We also discussed them in person together during this preop appointment. All of their questions were answered to their satisfaction.  Recommended calling if they have any further questions.  Risk consent form and Pain Medication Agreement to be scanned into patient's chart.  The risk that can be encountered with breast reduction were discussed and include the following but not limited to these:  Breast asymmetry, fluid accumulation, firmness of the breast, inability to  breast feed, loss of nipple or areola, skin loss, decrease or no nipple sensation, fat necrosis of the breast tissue, bleeding, infection, healing delay.  There are risks of anesthesia, changes  to skin sensation and injury to nerves or blood vessels.  The muscle can be temporarily or permanently injured.  You may have an allergic reaction to tape, suture, glue, blood products which can result in skin discoloration, swelling, pain, skin lesions, poor healing.  Any of these can lead to the need for revisonal surgery or stage procedures.  A reduction has potential to interfere with diagnostic procedures.  Nipple or breast piercing can increase risks of infection.  This procedure is best done when the breast is fully developed.  Changes in the breast will continue to occur over time.  Pregnancy can alter the outcomes of previous breast reduction surgery, weight gain and weigh loss can also effect the long term appearance.   Spanish interpreter was present throughout the entire encounter, Spanish interpreter went through the entire breast reduction consent form with patient and we reviewed all of her questions during today's encounter.  Patient is aware that she needs to complete nicotine cotinine test prior to surgery.    Will send clearance to PCP.  Electronically signed by: Kermit Balo Falan Hensler, PA-C 04/21/2023 4:34 PM

## 2023-04-21 NOTE — H&P (View-Only) (Signed)
 Patient ID: Audrey Munoz, female    DOB: [birth year], 28 y.o.   MRN: [id number]  Chief Complaint  Patient presents with   pre op      ICD-10-CM   1. 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  female  with a history of macromastia.  She presents for preoperative evaluation for upcoming procedure, Bilateral Breast Reduction, scheduled for 05/05/2023 with Dr.  Ladona Ridgel.  She is presents today with family and Spanish interpreter.  The patient has not had problems with anesthesia. No history of DVT/PE.  No family history of DVT/PE.  No family or personal history of bleeding or clotting disorders.  Patient is not currently taking any blood thinners.  No history of CVA/MI.  Patient denies any cardiac or pulmonary disease.  She was seen at [facility] clinic today for evaluation of her chronic back pain.  She also reports that when she is having chronic back pain she has noticed some chest pain associated with this.  She reports that she had an EKG today which was normal.  Per EMR review she presented with spinal pain, burning up and down the center and across the scapular areas.  She reported pressure and tightness that was worse when wearing a bra.  Better when not wearing a bra and resting.  She denied chest pain at the visit.  She was prescribed prednisone and Skelaxin today.  5 days of prednisone.  EKG showed NSR.  Summary of Previous Visit: STN 32 cm on the right, 35 cm on the left.  Nipple to fold 20 cm bilaterally.  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  Provided patient with letter today.  PMH Significant for: Idiopathic intracranial hypertension, hypertension, possible OSA (patient referred for sleep  study by neurology, but patient reports she has not done that test).  History of gestational diabetes.  She reports that she has tolerated surgery well in the past.  She is a former smoker, quit about 1 year ago.  She does report that she uses nicotine vapes, but stopped using these about 4 weeks ago.  She will be 6 weeks without nicotine at surgical date.  She is aware that she will need to complete a nicotine test.  Past Medical History: Allergies: No Known Allergies  Current Medications:  Current Outpatient Medications:    cyclobenzaprine (FLEXERIL) 10 MG tablet, Take 1 tablet (10 mg total) by mouth 3 (three) times daily as needed for muscle spasms., Disp: 30 tablet, Rfl: 0  Past Medical Problems: Past Medical History:  Diagnosis Date   Family history of ovarian cancer    8/21 cancer genetic testing letter sent   Gestational diabetes    History of gestational diabetes 04/22/2019   Hypertension    with pregnancy   Idiopathic intracranial hypertension    Seizures (HCC)    Seizures as a child with ?med   Syncope     Past Surgical History: Past Surgical History:  Procedure Laterality Date   CESAREAN SECTION N/A 02/25/2019   Procedure: CESAREAN SECTION;  Surgeon: Nadara Mustard, MD;  Location: [facility] ORS;  Service: Obstetrics;  Laterality: N/A;   FOOT GANGLION EXCISION     Cyst  removed from foot   FOOT SURGERY     LAPAROSCOPIC APPENDECTOMY N/A 11/27/2021   Procedure: APPENDECTOMY LAPAROSCOPIC;  Surgeon: Henrene Dodge, MD;  Location: [facility] OR[facility];  Service: General;  Laterality: N/A;   LAPAROSCOPIC OVARIAN CYSTECTOMY Right 11/27/2021   Procedure: LAPAROSCOPIC OVARIAN CYSTECTOMY;  Surgeon: Henrene Dodge, MD;  Location: [facility] OR[facility];  Service: General;  Laterality: Right;   LAPAROSCOPIC OVARIAN CYSTECTOMY Right 11/27/2021   Procedure: LAPAROSCOPIC OVARIAN CYSTECTOMY;  Surgeon: Hildred Laser, MD;  Location: [facility] OR[facility];  Service: Gynecology;  Laterality: Right;   LYSIS OF ADHESION  11/27/2021    Procedure: LYSIS OF ADHESION;  Surgeon: Henrene Dodge, MD;  Location: [facility] ORS;  Service: General;;    Social History: Social History   Socioeconomic History   Marital status: Single    Spouse name: Not on file   Number of children: Not on file   Years of education: 12   Highest education level: Not on file  Occupational History   Not on file  Tobacco Use   Smoking status: Former    Current packs/day: 0.00    Types: Cigarettes    Quit date: 07/16/2018    Years since quitting: 4.7   Smokeless tobacco: Never   Tobacco comments:    3 per day  Vaping Use   Vaping status: Never Used  Substance and Sexual Activity   Alcohol use: Not Currently    Comment: Last ETOH 03/12/18   Drug use: Never   Sexual activity: Yes    Birth control/protection: None    Comment: 01/2018 last used  Other Topics Concern   Not on file  Social History Narrative   Not on file   Social Drivers of Health   Financial Resource Strain: High Risk (12/17/2022)   Received from [facility]   Overall Financial Resource Strain (CARDIA)    Difficulty of Paying Living Expenses: Very hard  Food Insecurity: Food Insecurity Present (12/17/2022)   Received from [facility]   Hunger Vital Sign    Ran Out of Food in the Last Year: Sometimes true    Worried About Running Out of Food in the Last Year: Sometimes true  Transportation Needs: No Transportation Needs (12/17/2022)   Received from [facility]   PRAPARE - Transportation    Lack of Transportation (Non-Medical): No    In the past 12 months, has lack of transportation kept you from medical appointments or from getting medications?: No  Physical Activity: Insufficiently Active (09/12/2022)   Received from [facility]   Exercise Vital Sign    Days of Exercise per Week: 7 days    Minutes of Exercise per Session: 20 min  Stress: Stress Concern Present (09/16/2022)   Received from [facility] - Occupational Stress Questionnaire    Feeling of Stress : Very much  Social Connections: Socially Isolated (09/12/2022)   Received from [facility]   Social Connection and Isolation Panel [NHANES]    Frequency of Communication with Friends and Family: Twice a week    Frequency of Social Gatherings with Friends and Family: Never    Attends Religious Services: Never    Database administrator or Organizations: No    Attends Banker Meetings: Never    Marital Status: Never married  Catering manager Violence: Not on file    Family History: Family History  Problem Relation Age of Onset   Asthma  Father    Seizures Sister    Ovarian cancer Maternal Grandmother    Cancer - Ovarian Maternal Grandmother    Cancer Maternal Grandmother    Diabetes Mother     Review of Systems: Review of Systems  Constitutional: Negative.   Respiratory: Negative.    Cardiovascular: Negative.   Gastrointestinal: Negative.   Musculoskeletal:  Positive for back pain and neck pain.    Physical Exam: Vital Signs BP 108/62 (BP Location: Right Arm, Patient Position: Sitting, Cuff Size: Large)   Pulse (!) 56   Ht 5\' 3"  (1.6 m)   Wt 174 lb 3.2 oz (79 kg)   SpO2 100%   BMI 30.86 kg/m   Physical Exam Constitutional:      General: Not in acute distress.    Appearance: Normal appearance. Not ill-appearing.  HENT:     Head: Normocephalic and atraumatic.  Eyes:     Pupils: Pupils are equal, round Neck:     Musculoskeletal: Normal range of motion.  Cardiovascular:     Rate and Rhythm: Normal rate    Pulses: Normal pulses.  Pulmonary:     Effort: Pulmonary effort is normal. No respiratory distress.  Musculoskeletal: Normal range of motion.  Skin:    General: Skin is warm and dry.     Findings: No erythema or rash.  Neurological:     General: No focal deficit present.     Mental Status: Alert and  oriented to person, place, and time. Mental status is at baseline.     Motor: No weakness.  Psychiatric:        Mood and Affect: Mood normal.        Behavior: Behavior normal.    Assessment/Plan: The patient is scheduled for bilateral breast reduction with Dr. Ladona Ridgel.  Risks, benefits, and alternatives of procedure discussed, questions answered and consent obtained.    Smoking Status: Quit smoking cigarettes 1 year ago, quit smoking nicotine vape 4 weeks ago.; Counseling Given?  Discussed increased risk of postoperative complications including nipple areolar loss and delayed wound healing with use of nicotine.  Provided patient with nicotine cotinine test today.  Discussed with patient she will need to complete this prior to surgery, discussed with patient she will need to complete that this week in order for her to result prior to surgery.  Last Mammogram: Patient did have an ultrasound of her left breast in July 2024 which was BI-RADS 1 negative.  Caprini Score: 3, moderate; Risk Factors include: BMI > 25, and length of planned surgery. Recommendation for mechanical prophylaxis. Encourage early ambulation.   Pictures obtained: @consult   Post-op Rx sent to pharmacy: Oxycodone, Zofran  Patient was provided with the breast reduction and General Surgical Risk consent document and Pain Medication Agreement prior to their appointment.  They had adequate time to read through the risk consent documents and Pain Medication Agreement. We also discussed them in person together during this preop appointment. All of their questions were answered to their satisfaction.  Recommended calling if they have any further questions.  Risk consent form and Pain Medication Agreement to be scanned into patient's chart.  The risk that can be encountered with breast reduction were discussed and include the following but not limited to these:  Breast asymmetry, fluid accumulation, firmness of the breast, inability to  breast feed, loss of nipple or areola, skin loss, decrease or no nipple sensation, fat necrosis of the breast tissue, bleeding, infection, healing delay.  There are risks of anesthesia, changes  to skin sensation and injury to nerves or blood vessels.  The muscle can be temporarily or permanently injured.  You may have an allergic reaction to tape, suture, glue, blood products which can result in skin discoloration, swelling, pain, skin lesions, poor healing.  Any of these can lead to the need for revisonal surgery or stage procedures.  A reduction has potential to interfere with diagnostic procedures.  Nipple or breast piercing can increase risks of infection.  This procedure is best done when the breast is fully developed.  Changes in the breast will continue to occur over time.  Pregnancy can alter the outcomes of previous breast reduction surgery, weight gain and weigh loss can also effect the long term appearance.   Spanish interpreter was present throughout the entire encounter, Spanish interpreter went through the entire breast reduction consent form with patient and we reviewed all of her questions during today's encounter.  Patient is aware that she needs to complete nicotine cotinine test prior to surgery.    Will send clearance to PCP.  Electronically signed by: Kermit Balo Falan Hensler, PA-C 04/21/2023 4:34 PM

## 2023-04-22 ENCOUNTER — Telehealth: Payer: Self-pay

## 2023-04-22 NOTE — Telephone Encounter (Signed)
 Faxed Request for Surgical Clearance form to patient's PCP: Glenda L. Fields, NP, with confirmed receipt.   [facility] - Internal Medicine Ph: [phone] Fax: [phone]

## 2023-04-28 ENCOUNTER — Encounter (HOSPITAL_BASED_OUTPATIENT_CLINIC_OR_DEPARTMENT_OTHER): Payer: Self-pay | Admitting: Plastic Surgery

## 2023-04-28 ENCOUNTER — Other Ambulatory Visit: Payer: Self-pay

## 2023-04-28 NOTE — Progress Notes (Signed)
   04/28/23 1309  PAT Phone Screen  Is the patient taking a GLP-1 receptor agonist? No  Do You Have Diabetes? No  Do You Have Hypertension? Yes (no meds)  Have You Ever Been to the ER for Asthma? No  Have You Taken Oral Steroids in the Past 3 Months? No  Do you Take Phenteramine or any Other Diet Drugs? No  Recent  Lab Work, EKG, CXR? Yes  Where was this test performed? 04/21/23 [facility] EKG AND CXR all wnl.- Work up w/ PCP for back and chest pain- determined to be related to chest size/ need for surgery.  Do you have a history of heart problems? No  Any Recent Hospitalizations? No  Height 5\' 3"  (1.6 m)  Weight 79 kg  Pat Appointment Scheduled No  Reason for No Appointment Not Needed

## 2023-04-29 LAB — NICOTINE/COTININE METABOLITES
Cotinine: 9.7 ng/mL
Nicotine: <1 ng/mL

## 2023-05-03 NOTE — Anesthesia Preprocedure Evaluation (Signed)
 Anesthesia Evaluation  Patient identified by MRN, date of birth, ID band Patient awake    Reviewed: Allergy & Precautions, NPO status , Patient's Chart, lab work & pertinent test results  History of Anesthesia Complications Negative for: history of anesthetic complications  Airway Mallampati: III  TM Distance: >3 FB Neck ROM: Full    Dental  (+) Dental Advisory Given   Pulmonary neg pulmonary ROS, former smoker   Pulmonary exam normal breath sounds clear to auscultation       Cardiovascular hypertension, (-) angina (-) Past MI, (-) Cardiac Stents and (-) CABG (-) dysrhythmias  Rhythm:Regular Rate:Normal     Neuro/Psych Seizures - (as a child), Well Controlled,  Idiopathic intracranial HTN    GI/Hepatic negative GI ROS, Neg liver ROS,,,  Endo/Other  negative endocrine ROS    Renal/GU negative Renal ROS     Musculoskeletal   Abdominal  (+) + obese  Peds  Hematology negative hematology ROS (+)   Anesthesia Other Findings   Reproductive/Obstetrics                              Anesthesia Physical Anesthesia Plan  ASA: 2  Anesthesia Plan: General   Post-op Pain Management: Tylenol PO (pre-op)*   Induction: Intravenous  PONV Risk Score and Plan: 3 and Ondansetron, Dexamethasone, Midazolam, Scopolamine patch - Pre-op and Treatment may vary due to age or medical condition  Airway Management Planned: Oral ETT  Additional Equipment:   Intra-op Plan:   Post-operative Plan: Extubation in OR  Informed Consent: I have reviewed the patients History and Physical, chart, labs and discussed the procedure including the risks, benefits and alternatives for the proposed anesthesia with the patient or authorized representative who has indicated his/her understanding and acceptance.     Dental advisory given and Interpreter used for interview  Plan Discussed with: Anesthesiologist and  CRNA  Anesthesia Plan Comments: (Risks of general anesthesia discussed including, but not limited to, sore throat, hoarse voice, chipped/damaged teeth, injury to vocal cords, nausea and vomiting, allergic reactions, lung infection, heart attack, stroke, and death. All questions answered. )         Anesthesia Quick Evaluation

## 2023-05-05 ENCOUNTER — Encounter (HOSPITAL_BASED_OUTPATIENT_CLINIC_OR_DEPARTMENT_OTHER)
Admission: RE | Disposition: A | Discharge status: Home / Self Care | Payer: Self-pay | Source: Home / Self Care | Attending: Plastic Surgery

## 2023-05-05 ENCOUNTER — Ambulatory Visit (HOSPITAL_BASED_OUTPATIENT_CLINIC_OR_DEPARTMENT_OTHER): Admitting: Anesthesiology

## 2023-05-05 ENCOUNTER — Encounter (HOSPITAL_BASED_OUTPATIENT_CLINIC_OR_DEPARTMENT_OTHER): Payer: Self-pay | Admitting: Plastic Surgery

## 2023-05-05 ENCOUNTER — Ambulatory Visit (HOSPITAL_BASED_OUTPATIENT_CLINIC_OR_DEPARTMENT_OTHER)
Admission: RE | Admit: 2023-05-05 | Discharge: 2023-05-05 | Disposition: A | Discharge status: Home / Self Care | Payer: Commercial Managed Care - PPO | Attending: Plastic Surgery | Admitting: Plastic Surgery

## 2023-05-05 DIAGNOSIS — N6011 Diffuse cystic mastopathy of right breast: Secondary | ICD-10-CM | POA: Insufficient documentation

## 2023-05-05 DIAGNOSIS — N6032 Fibrosclerosis of left breast: Secondary | ICD-10-CM | POA: Diagnosis not present

## 2023-05-05 DIAGNOSIS — N62 Hypertrophy of breast: Secondary | ICD-10-CM | POA: Insufficient documentation

## 2023-05-05 DIAGNOSIS — G932 Benign intracranial hypertension: Secondary | ICD-10-CM | POA: Insufficient documentation

## 2023-05-05 DIAGNOSIS — Z01818 Encounter for other preprocedural examination: Secondary | ICD-10-CM

## 2023-05-05 DIAGNOSIS — Z87891 Personal history of nicotine dependence: Secondary | ICD-10-CM | POA: Insufficient documentation

## 2023-05-05 DIAGNOSIS — I10 Essential (primary) hypertension: Secondary | ICD-10-CM | POA: Diagnosis not present

## 2023-05-05 HISTORY — PX: BREAST REDUCTION SURGERY: SHX8

## 2023-05-05 LAB — POCT PREGNANCY, URINE: Preg Test, Ur: NEGATIVE

## 2023-05-05 SURGERY — MAMMOPLASTY, REDUCTION
Anesthesia: General | Site: Breast | Laterality: Bilateral

## 2023-05-05 MED ORDER — SUGAMMADEX SODIUM 200 MG/2ML IV SOLN
INTRAVENOUS | Status: DC | PRN
  Administered 2023-05-05: 200 mg via INTRAVENOUS

## 2023-05-05 MED ORDER — SCOPOLAMINE 1 MG/3DAYS TD PT72
MEDICATED_PATCH | TRANSDERMAL | Status: AC
  Filled 2023-05-05: qty 1

## 2023-05-05 MED ORDER — FENTANYL CITRATE (PF) 100 MCG/2ML IJ SOLN
25.0000 ug | INTRAMUSCULAR | Status: DC | PRN
  Administered 2023-05-05 (×3): 50 ug via INTRAVENOUS

## 2023-05-05 MED ORDER — DEXAMETHASONE SODIUM PHOSPHATE 10 MG/ML IJ SOLN
INTRAMUSCULAR | Status: AC
  Filled 2023-05-05: qty 1

## 2023-05-05 MED ORDER — SUCCINYLCHOLINE CHLORIDE 200 MG/10ML IV SOSY
PREFILLED_SYRINGE | INTRAVENOUS | Status: AC
  Filled 2023-05-05: qty 10

## 2023-05-05 MED ORDER — PHENYLEPHRINE HCL (PRESSORS) 10 MG/ML IV SOLN
INTRAVENOUS | Status: DC | PRN
  Administered 2023-05-05: 80 ug via INTRAVENOUS
  Administered 2023-05-05 (×2): 160 ug via INTRAVENOUS
  Administered 2023-05-05: 80 ug via INTRAVENOUS

## 2023-05-05 MED ORDER — MIDAZOLAM HCL 2 MG/2ML IJ SOLN
INTRAMUSCULAR | Status: AC
  Filled 2023-05-05: qty 2

## 2023-05-05 MED ORDER — CEFAZOLIN SODIUM-DEXTROSE 2-4 GM/100ML-% IV SOLN
2.0000 g | INTRAVENOUS | Status: AC
  Administered 2023-05-05: 2 g via INTRAVENOUS

## 2023-05-05 MED ORDER — OXYCODONE HCL 5 MG PO TABS: 5.0000 mg | ORAL_TABLET | Freq: Once | ORAL | Status: DC | PRN

## 2023-05-05 MED ORDER — OXYCODONE HCL 5 MG/5ML PO SOLN: 5.0000 mg | Freq: Once | ORAL | Status: DC | PRN

## 2023-05-05 MED ORDER — ATROPINE SULFATE 0.4 MG/ML IV SOLN
INTRAVENOUS | Status: AC
  Filled 2023-05-05: qty 1

## 2023-05-05 MED ORDER — MIDAZOLAM HCL 5 MG/5ML IJ SOLN
INTRAMUSCULAR | Status: DC | PRN
  Administered 2023-05-05: 2 mg via INTRAVENOUS

## 2023-05-05 MED ORDER — PROPOFOL 10 MG/ML IV BOLUS
INTRAVENOUS | Status: DC | PRN
  Administered 2023-05-05: 200 mg via INTRAVENOUS

## 2023-05-05 MED ORDER — DEXAMETHASONE SODIUM PHOSPHATE 4 MG/ML IJ SOLN
INTRAMUSCULAR | Status: DC | PRN
  Administered 2023-05-05: 8 mg via INTRAVENOUS

## 2023-05-05 MED ORDER — LACTATED RINGERS IV SOLN: INTRAVENOUS | Status: DC

## 2023-05-05 MED ORDER — ACETAMINOPHEN 500 MG PO TABS
ORAL_TABLET | ORAL | Status: AC
  Filled 2023-05-05: qty 1

## 2023-05-05 MED ORDER — PHENYLEPHRINE 80 MCG/ML (10ML) SYRINGE FOR IV PUSH (FOR BLOOD PRESSURE SUPPORT)
PREFILLED_SYRINGE | INTRAVENOUS | Status: AC
  Filled 2023-05-05: qty 10

## 2023-05-05 MED ORDER — CHLORHEXIDINE GLUCONATE CLOTH 2 % EX PADS: 6.0000 | MEDICATED_PAD | Freq: Once | CUTANEOUS | Status: DC

## 2023-05-05 MED ORDER — ONDANSETRON 4 MG PO TBDP
4.0000 mg | ORAL_TABLET | Freq: Once | ORAL | Status: AC
  Administered 2023-05-05: 4 mg via ORAL

## 2023-05-05 MED ORDER — AMISULPRIDE (ANTIEMETIC) 5 MG/2ML IV SOLN
INTRAVENOUS | Status: AC
  Filled 2023-05-05: qty 4

## 2023-05-05 MED ORDER — FENTANYL CITRATE (PF) 100 MCG/2ML IJ SOLN
INTRAMUSCULAR | Status: AC
  Filled 2023-05-05: qty 2

## 2023-05-05 MED ORDER — BUPIVACAINE LIPOSOME 1.3 % IJ SUSP
INTRAMUSCULAR | Status: AC
  Filled 2023-05-05: qty 20

## 2023-05-05 MED ORDER — ROCURONIUM BROMIDE 10 MG/ML (PF) SYRINGE
PREFILLED_SYRINGE | INTRAVENOUS | Status: AC
  Filled 2023-05-05: qty 10

## 2023-05-05 MED ORDER — OXYCODONE HCL 5 MG PO TABS: 5.0000 mg | ORAL_TABLET | Freq: Four times a day (QID) | ORAL | 0 refills | Status: AC | PRN

## 2023-05-05 MED ORDER — LIDOCAINE HCL (CARDIAC) PF 100 MG/5ML IV SOSY
PREFILLED_SYRINGE | INTRAVENOUS | Status: DC | PRN
  Administered 2023-05-05: 20 mg via INTRAVENOUS

## 2023-05-05 MED ORDER — SODIUM CHLORIDE (PF) 0.9 % IJ SOLN
INTRAMUSCULAR | Status: DC | PRN
  Administered 2023-05-05: 100 mL via INTRAMUSCULAR

## 2023-05-05 MED ORDER — ONDANSETRON HCL 4 MG PO TABS: 4.0000 mg | ORAL_TABLET | Freq: Three times a day (TID) | ORAL | 0 refills | Status: DC | PRN

## 2023-05-05 MED ORDER — BUPIVACAINE HCL (PF) 0.25 % IJ SOLN
INTRAMUSCULAR | Status: AC
  Filled 2023-05-05: qty 30

## 2023-05-05 MED ORDER — SCOPOLAMINE 1 MG/3DAYS TD PT72SCOPOLAMINE 1 MG/3DAYS
1.0000 | MEDICATED_PATCH | TRANSDERMAL | Status: DC
Start: 2023-05-05 — End: 2023-05-05
  Administered 2023-05-05: 1.5 mg via TRANSDERMAL

## 2023-05-05 MED ORDER — EPHEDRINE 5 MG/ML INJ
INTRAVENOUS | Status: AC
  Filled 2023-05-05: qty 5

## 2023-05-05 MED ORDER — ONDANSETRON HCL 4 MG/2ML IJ SOLN
INTRAMUSCULAR | Status: AC
  Filled 2023-05-05: qty 2

## 2023-05-05 MED ORDER — LIDOCAINE 2% (20 MG/ML) 5 ML SYRINGE
INTRAMUSCULAR | Status: AC
  Filled 2023-05-05: qty 5

## 2023-05-05 MED ORDER — HYDROMORPHONE HCL 1 MG/ML IJ SOLN
INTRAMUSCULAR | Status: DC | PRN
Start: 2023-05-05 — End: 2023-05-05
  Administered 2023-05-05: .5 mg via INTRAVENOUS

## 2023-05-05 MED ORDER — ONDANSETRON 4 MG PO TBDP
ORAL_TABLET | ORAL | Status: AC
  Filled 2023-05-05: qty 1

## 2023-05-05 MED ORDER — ROCURONIUM BROMIDE 100 MG/10ML IV SOLN
INTRAVENOUS | Status: DC | PRN
  Administered 2023-05-05: 60 mg via INTRAVENOUS

## 2023-05-05 MED ORDER — 0.9 % SODIUM CHLORIDE (POUR BTL) OPTIME
TOPICAL | Status: DC | PRN
  Administered 2023-05-05 (×2): 1000 mL

## 2023-05-05 MED ORDER — AMISULPRIDE (ANTIEMETIC) 5 MG/2ML IV SOLN
10.0000 mg | Freq: Once | INTRAVENOUS | Status: AC | PRN
  Administered 2023-05-05: 10 mg via INTRAVENOUS

## 2023-05-05 MED ORDER — FENTANYL CITRATE (PF) 100 MCG/2ML IJ SOLN
INTRAMUSCULAR | Status: AC
Start: 2023-05-05 — End: ?
  Filled 2023-05-05: qty 2

## 2023-05-05 MED ORDER — HYDROMORPHONE HCL 1 MG/ML IJ SOLN
INTRAMUSCULAR | Status: AC
  Filled 2023-05-05: qty 0.5

## 2023-05-05 MED ORDER — EPHEDRINE SULFATE (PRESSORS) 50 MG/ML IJ SOLN
INTRAMUSCULAR | Status: DC | PRN
Start: 2023-05-05 — End: 2023-05-05
  Administered 2023-05-05: 10 mg via INTRAVENOUS

## 2023-05-05 MED ORDER — ACETAMINOPHEN 500 MG PO TABS
1000.0000 mg | ORAL_TABLET | Freq: Once | ORAL | Status: AC
  Administered 2023-05-05: 1000 mg via ORAL

## 2023-05-05 MED ORDER — CEFAZOLIN SODIUM-DEXTROSE 2-4 GM/100ML-% IV SOLN
INTRAVENOUS | Status: AC
  Filled 2023-05-05: qty 100

## 2023-05-05 MED ORDER — SODIUM CHLORIDE (PF) 0.9 % IJ SOLN
INTRAMUSCULAR | Status: AC
  Filled 2023-05-05: qty 50

## 2023-05-05 MED ORDER — FENTANYL CITRATE (PF) 100 MCG/2ML IJ SOLN
INTRAMUSCULAR | Status: DC | PRN
  Administered 2023-05-05: 50 ug via INTRAVENOUS
  Administered 2023-05-05: 100 ug via INTRAVENOUS
  Administered 2023-05-05: 50 ug via INTRAVENOUS

## 2023-05-05 SURGICAL SUPPLY — 60 items
BINDER BREAST 3XL (GAUZE/BANDAGES/DRESSINGS) IMPLANT
BINDER BREAST LRG (GAUZE/BANDAGES/DRESSINGS) IMPLANT
BINDER BREAST MEDIUM (GAUZE/BANDAGES/DRESSINGS) IMPLANT
BINDER BREAST XLRG (GAUZE/BANDAGES/DRESSINGS) IMPLANT
BINDER BREAST XXLRG (GAUZE/BANDAGES/DRESSINGS) IMPLANT
BIOPATCH RED 1 DISK 7.0 (GAUZE/BANDAGES/DRESSINGS) ×2 IMPLANT
BLADE HEX COATED 2.75 (ELECTRODE) IMPLANT
BLADE SURG 10 STRL SS (BLADE) ×6 IMPLANT
BLADE SURG 15 STRL LF DISP TIS (BLADE) ×1 IMPLANT
CANISTER SUCT 1200ML W/VALVE (MISCELLANEOUS) ×1 IMPLANT
DERMABOND ADVANCED .7 DNX12 (GAUZE/BANDAGES/DRESSINGS) ×2 IMPLANT
DRAIN CHANNEL 19F RND (DRAIN) ×2 IMPLANT
DRAPE IMP U-DRAPE 54X76 (DRAPES) IMPLANT
DRAPE UTILITY XL STRL (DRAPES) ×1 IMPLANT
DRSG TEGADERM 4X10 (GAUZE/BANDAGES/DRESSINGS) IMPLANT
DRSG TEGADERM 4X4.75 (GAUZE/BANDAGES/DRESSINGS) ×2 IMPLANT
ELECT BLADE 4.0 EZ CLEAN MEGAD (MISCELLANEOUS) ×1 IMPLANT
ELECT REM PT RETURN 9FT ADLT (ELECTROSURGICAL) ×2 IMPLANT
ELECTRODE BLDE 4.0 EZ CLN MEGD (MISCELLANEOUS) ×1 IMPLANT
ELECTRODE REM PT RTRN 9FT ADLT (ELECTROSURGICAL) ×2 IMPLANT
EVACUATOR SILICONE 100CC (DRAIN) ×2 IMPLANT
GAUZE PAD ABD 8X10 STRL (GAUZE/BANDAGES/DRESSINGS) ×2 IMPLANT
GAUZE SPONGE 2X2 12PLY NS (GAUZE/BANDAGES/DRESSINGS) IMPLANT
GAUZE SPONGE 2X2 STRL 8-PLY (GAUZE/BANDAGES/DRESSINGS) ×2 IMPLANT
GLOVE BIO SURGEON STRL SZ 6.5 (GLOVE) IMPLANT
GLOVE BIO SURGEON STRL SZ7.5 (GLOVE) IMPLANT
GLOVE BIO SURGEON STRL SZ8 (GLOVE) ×1 IMPLANT
GLOVE BIOGEL PI IND STRL 7.0 (GLOVE) IMPLANT
GLOVE BIOGEL PI IND STRL 8 (GLOVE) ×1 IMPLANT
GOWN STRL REUS W/ TWL LRG LVL3 (GOWN DISPOSABLE) ×1 IMPLANT
GOWN STRL REUS W/TWL XL LVL3 (GOWN DISPOSABLE) ×1 IMPLANT
HEMOSTAT ARISTA ABSORB 3G PWDR (HEMOSTASIS) IMPLANT
HIBICLENS CHG 4% 4OZ BTL (MISCELLANEOUS) ×1 IMPLANT
MARKER SKIN DUAL TIP RULER LAB (MISCELLANEOUS) ×1 IMPLANT
NDL HYPO 22X1.5 SAFETY MO (MISCELLANEOUS) ×2 IMPLANT
NEEDLE HYPO 22X1.5 SAFETY MO (MISCELLANEOUS) ×3 IMPLANT
NS IRRIG 1000ML POUR BTL (IV SOLUTION) ×1 IMPLANT
PACK BASIN DAY SURGERY FS (CUSTOM PROCEDURE TRAY) ×1 IMPLANT
PACK UNIVERSAL I (CUSTOM PROCEDURE TRAY) ×1 IMPLANT
PENCIL SMOKE EVACUATOR (MISCELLANEOUS) ×2 IMPLANT
PIN SAFETY STERILE (MISCELLANEOUS) ×1 IMPLANT
SLEEVE SCD COMPRESS KNEE MED (STOCKING) ×1 IMPLANT
SPONGE T-LAP 18X18 ~~LOC~~+RFID (SPONGE) ×3 IMPLANT
STAPLER SKIN PROX WIDE 3.9 (STAPLE) ×1 IMPLANT
STRIP CLOSURE SKIN 1/2X4 (GAUZE/BANDAGES/DRESSINGS) IMPLANT
SUT MNCRL AB 3-0 PS2 27 (SUTURE) ×4 IMPLANT
SUT MNCRL AB 4-0 PS2 18 (SUTURE) ×4 IMPLANT
SUT MON AB 2-0 CT1 36 (SUTURE) ×1 IMPLANT
SUT MON AB 5-0 PS2 18 (SUTURE) IMPLANT
SUT SILK 2 0 SH (SUTURE) ×2 IMPLANT
SUT VIC AB 3-0 SH 27X BRD (SUTURE) IMPLANT
SYR 20ML LL LF (SYRINGE) ×2 IMPLANT
SYR BULB IRRIG 60ML STRL (SYRINGE) ×1 IMPLANT
SYR CONTROL 10ML LL (SYRINGE) ×1 IMPLANT
TAPE MEASURE VINYL STERILE (MISCELLANEOUS) IMPLANT
TOWEL GREEN STERILE FF (TOWEL DISPOSABLE) ×2 IMPLANT
TRAY DSU PREP LF (CUSTOM PROCEDURE TRAY) ×1 IMPLANT
TUBE CONNECTING 20X1/4 (TUBING) ×1 IMPLANT
UNDERPAD 30X36 HEAVY ABSORB (UNDERPADS AND DIAPERS) ×2 IMPLANT
YANKAUER SUCT BULB TIP NO VENT (SUCTIONS) ×1 IMPLANT

## 2023-05-05 NOTE — Transfer of Care (Signed)
 Immediate Anesthesia Transfer of Care Note  Patient: Audrey Munoz  Procedure(s) Performed: bilateral breast reduction (Bilateral: Breast)  Patient Location: PACU  Anesthesia Type:General  Level of Consciousness: awake, alert , oriented, drowsy, and patient cooperative  Airway & Oxygen Therapy: Patient Spontanous Breathing and Patient connected to face mask oxygen  Post-op Assessment: Report given to RN and Post -op Vital signs reviewed and stable  Post vital signs: Reviewed and stable  Last Vitals:  Vitals Value Taken Time  BP 112/67 05/05/23 1101  Temp 36.1 C 05/05/23 1100  Pulse 85 05/05/23 1102  Resp 19 05/05/23 1102  SpO2 100 % 05/05/23 1102  Vitals shown include unfiled device data.  Last Pain:  Vitals:   05/05/23 1100  TempSrc:   PainSc: 7          Complications: No notable events documented.

## 2023-05-05 NOTE — Anesthesia Postprocedure Evaluation (Signed)
 Anesthesia Post Note  Patient: Audrey Munoz  Procedure(s) Performed: bilateral breast reduction (Bilateral: Breast)     Patient location during evaluation: PACU Anesthesia Type: General Level of consciousness: awake Pain management: pain level controlled Vital Signs Assessment: post-procedure vital signs reviewed and stable Respiratory status: spontaneous breathing, nonlabored ventilation and respiratory function stable Cardiovascular status: blood pressure returned to baseline and stable Postop Assessment: no apparent nausea or vomiting Anesthetic complications: no   No notable events documented.  Last Vitals:  Vitals:   05/05/23 1145 05/05/23 1204  BP: 117/63 113/77  Pulse: 66 82  Resp: 17 18  Temp:  (!) 36.4 C  SpO2: 100% 97%    Last Pain:  Vitals:   05/05/23 1204  TempSrc: Temporal  PainSc: 0-No pain                 Linton Rump

## 2023-05-05 NOTE — Anesthesia Procedure Notes (Signed)
 Procedure Name: Intubation Date/Time: 05/05/2023 7:35 AM  Performed by: Ronnette Hila, CRNAPre-anesthesia Checklist: Patient identified, Emergency Drugs available, Suction available and Patient being monitored Patient Re-evaluated:Patient Re-evaluated prior to induction Oxygen Delivery Method: Circle system utilized Preoxygenation: Pre-oxygenation with 100% oxygen Induction Type: IV induction Ventilation: Mask ventilation without difficulty Laryngoscope Size: Mac and 3 Grade View: Grade I Tube type: Oral Tube size: 7.0 mm Number of attempts: 1 Airway Equipment and Method: Stylet and Oral airway Placement Confirmation: ETT inserted through vocal cords under direct vision, positive ETCO2 and breath sounds checked- equal and bilateral Secured at: 22 cm Tube secured with: Tape Dental Injury: Teeth and Oropharynx as per pre-operative assessment

## 2023-05-05 NOTE — Op Note (Signed)
 DATE OF OPERATION: 05/05/2023  LOCATION: [facility] operating Room  PREOPERATIVE DIAGNOSIS: Symptomatic macromastia  POSTOPERATIVE DIAGNOSIS: Same  PROCEDURE: Bilateral breast reduction  SURGEON: Loren Racer, MD  ASSISTANT: Matt Scheeler  EBL: 150 cc  CONDITION: Stable  COMPLICATIONS: None  INDICATION: The patient, Audrey Munoz, is a 28 y.o. female born on 19-Jan-1996, is here for treatment ongoing back and neck pain secondary to large breast size.   PROCEDURE DETAILS:  The patient was seen prior to surgery and marked.  The IV antibiotics were given. The patient was taken to the operating room and given a general anesthetic. A standard time out was performed and all information was confirmed by those in the room. SCDs were placed.   The chest was prepped and draped in usual sterile manner.  A 42 mm cookie cutter was used to outline the proposed nipple areolar complexes bilaterally and an 8 cm based inferior pedicle was outlined on each breast.  Laparotomy tape was placed at the base of the right breast and the pedicle was de-epithelialized sharply.  The electrocautery was used to dissect the borders of the pedicle to the chest wall.  The electrocautery was then used to resect the medial lateral and superior triangles of breast tissue and to develop and thin the superior skin flap.  The breast tissue removed constituted the bulk of the reduction and weighed 1040 g.  The surgical bed was inspected for bleeding and hemostasis achieved with the electrocautery.  50 mL of a mixture of Exparel, quarter percent plain Marcaine, saline was then infiltrated into the subfascial space over the pectoralis muscle and subcutaneous tissues.  The surgical bed was irrigated with warm normal saline and a 19 French round drain was placed behind the pedicle and brought out through a separate stab incision.  A 2-0 Monocryl suture was used to approximate the T point.  The skin edges were tailor tacked in place  with skin clips and the dermis closed with an interrupted and running 3-0 Monocryl suture.  The skin was closed with a running 4-0 Monocryl subcuticular stitch.  Attention was turned to the left breast where similar procedure was performed.  After placing a laparotomy tape at the base of the breast the pedicle was de-epithelialized and dissected to the chest wall.  The medial lateral and superior triangles of breast tissue were resected and the superior skin flap developed and thinned.  The breast tissue removed constituted the bulk of the reduction and weighed 1172 g.  All breast tissue was sent to pathology for routine examination.  Again the surgical bed was inspected for bleeding and hemostasis achieved.  The subfascial space and subcutaneous tissues were infiltrated with 50 mL of the Exparel mixture.  The wound was irrigated and a 19 [place] round drain placed behind the pedicle and brought out through a separate stab incision.  The T point was approximated with a 2-0 Monocryl and the skin edges tailor tacked in place with skin clips.  The dermis was closed with interrupted and running 3-0 Monocryl sutures and the skin closed with a running 4-0 Monocryl subcuticular stitch.  The incisions were then sealed with Dermabond and the patient was placed in a supportive compressive garment.  She was awakened from anesthesia without incident transferred to the recovery room in good condition.  All instrument needle and sponge counts were reported as correct and there were no complications appreciated during the procedure. The patient was allowed to wake up and taken to recovery room  in stable condition at the end of the case. The family was notified at the end of the case.   The advanced practice practitioner (APP) assisted throughout the case.  The APP was essential in retraction and counter traction when needed to make the case progress smoothly.  This retraction and assistance made it possible to see the tissue  plans for the procedure.  The assistance was needed for blood control, tissue re-approximation and assisted with closure of the incision site.

## 2023-05-05 NOTE — Discharge Instructions (Addendum)
 INSTRUCTIONS FOR AFTER BREAST SURGERY   You will likely have some questions about what to expect following your operation.  The following information will help you and your family understand what to expect when you are discharged from the hospital.  It is important to follow these guidelines to help ensure a smooth recovery and reduce complication.  Postoperative instructions include information on: diet, wound care, medications and physical activity.  AFTER SURGERY Expect to go home after the procedure.  In some cases, you may need to spend one night in the hospital for observation.  DIET Breast surgery does not require a specific diet.  However, the healthier you eat the better your body will heal. It is important to increasing your protein intake.  This means limiting the foods with sugar and carbohydrates.  Focus on vegetables and some meat.  If you have liposuction during your procedure be sure to drink water.  If your urine is bright yellow, then it is concentrated, and you need to drink more water.  As a general rule after surgery, you should have 8 ounces of water every hour while awake.  If you find you are persistently nauseated or unable to take in liquids let us know.  NO TOBACCO USE or EXPOSURE.  This will slow your healing process and lead to a wound.  WOUND CARE Leave the binder on at all times except when showering . Use fragrance free soap like Dial, Dove or [place].   After 24 hours you can remove the binder to shower. Once dry apply binder or sports bra. No baths, pools or hot tubs for four weeks. We close your incision to leave the smallest and best-looking scar. No ointment or creams on your incisions for four weeks.  No Neosporin (Too many skin reactions).  A few weeks after surgery you can use Mederma and start massaging the scar. We ask you to wear your binder or sports bra for the first 6 weeks around the clock, including while sleeping. This provides added comfort and helps  reduce the fluid accumulation at the surgery site. NO Ice or heating pads to the operative site.  You have a very high risk of a BURN before you feel the temperature change. Continue to empty, recharge, & record drainage from drains 2-3 times a day, as needed.   ACTIVITY No heavy lifting until cleared by the doctor.  This usually means no more than a half-gallon of milk.  It is OK to walk and climb stairs. Moving your legs is very important to decrease your risk of a blood clot.  It will also help keep you from getting deconditioned.  Every 1 to 2 hours get up and walk for 5 minutes. This will help with a quicker recovery back to normal.  Let pain be your guide so you don't do too much.  This time is for you to recover.  You will be more comfortable if you sleep and rest with your head elevated either with a few pillows under you or in a recliner.  No stomach sleeping for a three months.  WORK Everyone returns to work at different times. As a rough guide, most people take at least 1 - 2 weeks off prior to returning to work. If you need documentation for your job, give the forms to the front staff at the clinic.  DRIVING Arrange for someone to bring you home from the hospital after your surgery.  You may be able to drive a few days  after surgery but not while taking any narcotics or valium.  BOWEL MOVEMENTS Constipation can occur after anesthesia and while taking pain medication.  It is important to stay ahead for your comfort.  We recommend taking Milk of Magnesia (2 tablespoons; twice a day) while taking the pain pills.  MEDICATIONS You may be prescribed should start after surgery At your preoperative visit for you history and physical you may have been given the following medications: Zofran 4 mg:  This is to treat nausea and vomiting.  You can take this every 6 hours as needed and only if needed. Oxycodone 5 mg:  This is only to be used after you have taken the Motrin or the Tylenol. Every 8  hours as needed.   Over the counter Medication to take: Ibuprofen (Motrin) 600 mg:  Take this every 6 hours.  If you have additional pain then take 500 mg of the Tylenol every 8 hours.  Only take the Norco after you have tried these two. MiraLAX or Milk of Magnesia: Take this according to the bottle if you take the Norco.  WHEN TO CALL Call your surgeon's office if any of the following occur: Fever 101 degrees F or greater Excessive bleeding or fluid from the incision site. Pain that increases over time without aid from the medications Redness, warmth, or pus draining from incision sites Persistent nausea or inability to take in liquids Severe misshapen area that underwent the operation.  Here are some resources for breast cancer patients:  Plastic surgery website: https://www.plasticsurgery.org/for-medical-professionals/education-and-resources/publications/breast-reconstruction-magazine Breast Reconstruction Awareness Campaign:  ChessContest[employer]fr Plastic surgery Implant information:  https://www.plasticsurgery.org/patient-safety/breast-implant-safety  Information for Discharge Teaching: EXPAREL (bupivacaine liposome injectable suspension)   Pain relief is important to your recovery. The goal is to control your pain so you can move easier and return to your normal activities as soon as possible after your procedure. Your physician may use several types of medicines to manage pain, swelling, and more.  Your surgeon or anesthesiologist gave you EXPAREL(bupivacaine) to help control your pain after surgery.  EXPAREL is a local anesthetic designed to release slowly over an extended period of time to provide pain relief by numbing the tissue around the surgical site. EXPAREL is designed to release pain medication over time and can control pain for up to 72 hours. Depending on how you respond to EXPAREL, you may require less pain medication during your recovery. EXPAREL can help  reduce or eliminate the need for opioids during the first few days after surgery when pain relief is needed the most. EXPAREL is not an opioid and is not addictive. It does not cause sleepiness or sedation.   Important! A teal colored band has been placed on your arm with the date, time and amount of EXPAREL you have received. Please leave this armband in place for the full 96 hours following administration, and then you may remove the band. If you return to the hospital for any reason within 96 hours following the administration of EXPAREL, the armband provides important information that your health care providers to know, and alerts them that you have received this anesthetic.    Possible side effects of EXPAREL: Temporary loss of sensation or ability to move in the area where medication was injected. Nausea, vomiting, constipation Rarely, numbness and tingling in your mouth or lips, lightheadedness, or anxiety may occur. Call your doctor right away if you think you may be experiencing any of these sensations, or if you have other questions regarding possible side effects.  Follow all other discharge instructions given to you by your surgeon or nurse. Eat a healthy diet and drink plenty of water or other fluids.   Post Anesthesia Home Care Instructions  Activity: Get plenty of rest for the remainder of the day. A responsible individual must stay with you for 24 hours following the procedure.  For the next 24 hours, DO NOT: -Drive a car -Advertising copywriter -Drink alcoholic beverages -Take any medication unless instructed by your physician -Make any legal decisions or sign important papers.  Meals: Start with liquid foods such as gelatin or soup. Progress to regular foods as tolerated. Avoid greasy, spicy, heavy foods. If nausea and/or vomiting occur, drink only clear liquids until the nausea and/or vomiting subsides. Call your physician if vomiting continues.  Special  Instructions/Symptoms: Your throat may feel dry or sore from the anesthesia or the breathing tube placed in your throat during surgery. If this causes discomfort, gargle with warm salt water. The discomfort should disappear within 24 hours.  If you had a scopolamine patch placed behind your ear for the management of post- operative nausea and/or vomiting:  1. The medication in the patch is effective for 72 hours, after which it should be removed.  Wrap patch in a tissue and discard in the trash. Wash hands thoroughly with soap and water. 2. You may remove the patch earlier than 72 hours if you experience unpleasant side effects which may include dry mouth, dizziness or visual disturbances. 3. Avoid touching the patch. Wash your hands with soap and water after contact with the patch  Tylenol can be taken after 12:40 pm if needed

## 2023-05-05 NOTE — Interval H&P Note (Signed)
 History and Physical Interval Note: No change in exam or indication for surgery Marked for a bilateral breast reduction with her assistance All questions answered via interpreter Will proceed at her request   05/05/2023 7:07 AM  Audrey Munoz Audrey Munoz  has presented today for surgery, with the diagnosis of Macromastia.  The various methods of treatment have been discussed with the patient and family. After consideration of risks, benefits and other options for treatment, the patient has consented to  Procedure(s): bilateral breast reduction (Bilateral) as a surgical intervention.  The patient's history has been reviewed, patient examined, no change in status, stable for surgery.  I have reviewed the patient's chart and labs.  Questions were answered to the patient's satisfaction.     Santiago Glad

## 2023-05-06 ENCOUNTER — Encounter (HOSPITAL_BASED_OUTPATIENT_CLINIC_OR_DEPARTMENT_OTHER): Payer: Self-pay | Admitting: Plastic Surgery

## 2023-05-06 ENCOUNTER — Ambulatory Visit: Payer: Commercial Managed Care - PPO | Admitting: Surgical

## 2023-05-06 VITALS — BP 107/63 | HR 79

## 2023-05-06 DIAGNOSIS — N62 Hypertrophy of breast: Secondary | ICD-10-CM

## 2023-05-06 LAB — SURGICAL PATHOLOGY

## 2023-05-06 NOTE — Progress Notes (Signed)
 Patient is a 28 y.o.-year-old female status post bilateral breast reduction with Dr.  Ladona Ridgel. Patient is postop day 1.  She is here for bilateral JP drain removal.  Output has been 40 cc from each side over the 24 hours or so since surgery.  She reports that she is overall feeling well, reports she does have some discomfort in bilateral breast, right greater than left.  She is not having any infectious symptoms.  She is here with her significant other. Spanish interpreter is present   Chaperone present on exam Bilateral NAC's are viable, bilateral breast incisions are intact. There is no erythema or cellulitic changes noted. No obvious subcutaneous fluid collections noted with palpation. JP drains in place with serosanguineous drainage in each bulb.  All of her skin flaps are viable with good color.  No skin epidermal lysis is noted.  Bilateral breasts are symmetric.  A/P:  Recommend continuing with compressive garment 24/7 until 6 weeks post-op,  avoiding strenuous activity/heavy lifting until 6 weeks post-op  Recommend following up next week for reevaluation.  Bilateral JP drains were removed, patient tolerated well.  Discussed with patient to change gauze over JP drain insertion site openings daily.  All of the patient's questions were answered to their content. Recommend calling with any questions or concerns.

## 2023-05-10 ENCOUNTER — Other Ambulatory Visit: Payer: Self-pay

## 2023-05-10 ENCOUNTER — Emergency Department
Admission: EM | Admit: 2023-05-10 | Discharge: 2023-05-10 | Discharge status: Against Medical Advice | Attending: Emergency Medicine | Admitting: Emergency Medicine

## 2023-05-10 DIAGNOSIS — Z5321 Procedure and treatment not carried out due to patient leaving prior to being seen by health care provider: Secondary | ICD-10-CM | POA: Insufficient documentation

## 2023-05-10 DIAGNOSIS — R519 Headache, unspecified: Secondary | ICD-10-CM | POA: Diagnosis not present

## 2023-05-10 DIAGNOSIS — R1110 Vomiting, unspecified: Secondary | ICD-10-CM | POA: Insufficient documentation

## 2023-05-10 DIAGNOSIS — N644 Mastodynia: Secondary | ICD-10-CM | POA: Diagnosis present

## 2023-05-10 DIAGNOSIS — G8918 Other acute postprocedural pain: Secondary | ICD-10-CM | POA: Diagnosis not present

## 2023-05-10 LAB — BASIC METABOLIC PANEL WITH GFR
Anion gap: 8 (ref 5–15)
BUN: 13 mg/dL (ref 6–20)
CO2: 26 mmol/L (ref 22–32)
Calcium: 8.8 mg/dL — ABNORMAL LOW (ref 8.9–10.3)
Chloride: 102 mmol/L (ref 98–111)
Creatinine, Ser: 0.69 mg/dL (ref 0.44–1.00)
GFR, Estimated: >60 mL/min (ref >60)
Glucose, Bld: 122 mg/dL — ABNORMAL HIGH (ref 70–99)
Potassium: 4.3 mmol/L (ref 3.5–5.1)
Sodium: 136 mmol/L (ref 135–145)

## 2023-05-10 LAB — CBC
HCT: 32.2 % — ABNORMAL LOW (ref 36.0–46.0)
Hemoglobin: 10.7 g/dL — ABNORMAL LOW (ref 12.0–15.0)
MCH: 30 pg (ref 26.0–34.0)
MCHC: 33.2 g/dL (ref 30.0–36.0)
MCV: 90.2 fL (ref 80.0–100.0)
Platelets: 324 10*3/uL (ref 150–400)
RBC: 3.57 MIL/uL — ABNORMAL LOW (ref 3.87–5.11)
RDW: 11.7 % (ref 11.5–15.5)
WBC: 10.6 10*3/uL — ABNORMAL HIGH (ref 4.0–10.5)
nRBC: 0 % (ref 0.0–0.2)

## 2023-05-10 MED ORDER — ONDANSETRON 4 MG PO TBDP: 4.0000 mg | ORAL_TABLET | Freq: Once | ORAL | Status: DC

## 2023-05-10 NOTE — ED Triage Notes (Signed)
 Pt to ED via POV c/o bilateral breast pain, vomiting, and headache. Pt had breast reduction on 4/8. Pt started having pain in breasts today. Denies fevers. Took tylenol about an hr ago.

## 2023-05-11 ENCOUNTER — Ambulatory Visit: Admitting: Student

## 2023-05-11 ENCOUNTER — Telehealth: Payer: Self-pay | Admitting: Plastic Surgery

## 2023-05-11 VITALS — BP 114/74 | HR 66 | Temp 97.9°F

## 2023-05-11 DIAGNOSIS — N62 Hypertrophy of breast: Secondary | ICD-10-CM

## 2023-05-11 MED ORDER — KETOROLAC TROMETHAMINE 10 MG PO TABS: 10.0000 mg | ORAL_TABLET | Freq: Three times a day (TID) | ORAL | 0 refills | Status: DC | PRN

## 2023-05-11 NOTE — Telephone Encounter (Signed)
 Patient calling in with interpreter services. She states she is having bad headaches since surgery. She went to ER yesterday waited for 3 hours and was never seen call back number is [phone]

## 2023-05-11 NOTE — Telephone Encounter (Signed)
 Patient is coming in for evaluation this a.m.  Spanish interpreter was used.  Reviewed labs from ED, overall appear stable given recent surgery.

## 2023-05-11 NOTE — Progress Notes (Signed)
 Patient is a 28 year old female who underwent bilateral breast reduction with Dr. Ladona Ridgel on 05/05/2023.  She is 6 days postop.  She presents to the clinic today with concerns about pain to her breasts.  Patient was last seen in the clinic on 05/06/2023.  At this visit, patient denied any infectious symptoms.  She reported she was overall feeling well.  On exam, NAC's were healthy, incisions were intact.  There were no obvious fluid collections on exam.  JP drains were in place with serosanguineous drainage in each of the bulbs.  Drains were removed.   Spanish interpreter is present at bedside today.  Today, patient presents with her mother at bedside as well.  She states that yesterday she went to the emergency room for headaches.  She states that they took her labs, and she left after waiting for 3 hours.  She states that her headache has been about the same since yesterday.  She states that she does have a history of migraines, and usually takes Excedrin to help with it, but did not this time because she was under the impression she could not take Excedrin after surgery.  She reports that she vomited as well yesterday, but has not vomited since.  She states she has been able to keep fluids and food down.  She denies any fevers.  She denies any drainage from her incisions.  She does reports she has some pain to the inferior aspect of her breasts.  She reports her left breast is slightly more swollen than her right.  Patient states that she was taking oxycodone for her pain, but reports that she felt the oxycodone brought on her headache.  She reports since then she has been taking Tylenol for pain.  Patient denies any changes in her vision, shortness of breath or deep chest pain.  Per chart review, WBC in the ER was 10.6 and her hemoglobin was 10.7.  Vitals:   05/11/23 1131  BP: 114/74  Pulse: 66  Temp: 97.9 F (36.6 C)  SpO2: 99%  Patient is afebrile and vitals are stable.  Chaperone present on exam.   On exam, patient is sitting upright in no acute distress.  Breasts are soft and fairly symmetric.  Left breast is slightly more swollen than the right.  There is some overlying ecchymosis to both breasts.  There is no overlying erythema.  There are no fluid collections palpated on exam.  NAC's appear to be healthy bilaterally.  Sensation is intact to the NAC's bilaterally.  Incisions are intact and healing well.  There are no signs of infection on exam.  Discussed with patient that her exam is reassuring.  I did discuss with her to continue to closely monitor her breasts.  Discussed with her that if she develops any redness, worsening swelling, worsening pain, drainage to let us know.  Patient expressed understanding.  I recommended that we add Toradol for patient's postsurgical pain.  I discussed with the patient that she cannot take Excedrin, ibuprofen or any other NSAIDs while taking this medication.  Patient expressed understanding.  Discussed with patient that she may switch out of her binder and into a sports bra if that is more comfortable for her.  Patient expressed understanding.  Discussed with patient that she would need to either go back to the emergency room, in urgent care or her primary care provider in regards to her headaches to be evaluated.  Patient expressed understanding.  Patient states that she has an appointment this  Wednesday.  I discussed with her that if she feels like she is significantly improved, she does not have to come to that appointment and may come in the following week.  I discussed with the patient though that if she is still having pain or any issues that she should keep her appointment for Wednesday.  Patient expressed understanding and was in agreement with this.  I instructed the patient to call in the meantime if she has any questions or concerns about anything.

## 2023-05-13 ENCOUNTER — Ambulatory Visit: Payer: Commercial Managed Care - PPO | Admitting: Plastic Surgery

## 2023-05-13 VITALS — BP 104/72 | HR 76

## 2023-05-13 DIAGNOSIS — Z9889 Other specified postprocedural states: Secondary | ICD-10-CM

## 2023-05-13 NOTE — Progress Notes (Signed)
 [name] returns today approximately 8 days postop from a bilateral breast reduction.  [name] does speak English but prefers to use a Spanish interpreter and Rosanne Commodore was present throughout the entire visit.  [name] has no specific complaints though she does note hypersensitivity of the nipples.  She also notes some tightness across the upper chest.  She denies any significant drainage from the breast.  Her pain is well-controlled   On examination she has a very nice early result.  Symmetry is good.  There is fullness as usual along the lateral aspect of the breast.  The nipples are warm and well-perfused.  The incisions are clean dry and intact.  We discussed patient activity slowly.  We discussed beginning scar massage next week.  We discussed the fact that tightness across her upper chest is normal and will improve as the skin begins to relax.  Additionally the fullness on the lateral aspect of the breast will generally improve as the swelling resolves.  All questions were answered to her satisfaction.  She will keep her scheduled appointment next week.

## 2023-05-19 ENCOUNTER — Encounter: Payer: Self-pay | Admitting: Student

## 2023-05-19 ENCOUNTER — Ambulatory Visit (INDEPENDENT_AMBULATORY_CARE_PROVIDER_SITE_OTHER): Admitting: Student

## 2023-05-19 VITALS — BP 125/79 | HR 80 | Ht 63.0 in | Wt 177.6 lb

## 2023-05-19 DIAGNOSIS — Z9889 Other specified postprocedural states: Secondary | ICD-10-CM

## 2023-05-19 NOTE — Progress Notes (Signed)
 Patient is a 28 year old female who underwent bilateral breast reduction with Dr. Carolynne Citron on 05/05/2023.  Patient is about 2 weeks postop.  She presents to the clinic today for postoperative follow-up.  She is patient was last seen in the clinic on 05/13/2023.  At this visit, patient reported some tightness across her upper chest.  She denied any drainage from her breasts.  On exam, patient was noted to have a nice early result.  Symmetry was good.  There was fullness along the lateral aspect of the breast.  The nipples were warm and well-perfused.  The incisions were clean dry and intact.  Interpreter is present at today's visit.  Today, patient reports she is doing well.  She states that she has noticed some hypersensitivity to her left nipple.  She also states that she has noticed a little bit of drainage from her right breast incision and some scabbing to her left breast incision.  She denies any fevers or chills.  She states that her headaches have gotten much better since I last saw her.  Patient states that she is not having to take any medications for her pain.  Chaperone present on exam.  On exam, patient is sitting upright in no acute distress.  Breasts are soft and symmetric.  There is some firmness noted to the lateral aspects of the breast bilaterally consistent with some scar tissue.  There is some residual bruising noted bilaterally.  There is no overlying erythema.  No obvious fluid collections palpated on exam.  NAC's appear to be healthy bilaterally.  Incisions to the right breast are intact and healing well.  To the left breast, there does appear to be a small superficial wound developing at the inferior T-zone.  Otherwise incisions to the left breast are healing well.  There are no signs of infection on exam.  Discussed with patient that the hypersensitivity should improve with time.  Discussed with her that this is most likely due to the nerves.  Patient expressed understanding.  Discussed  with patient that it looks like she is starting to develop a small superficial wound to the inferior T-zone.  Recommended that she apply Vaseline to this area and to the remainder of her incisions daily.  Discussed with her that this area might open up a little bit which is to be expected.  Patient expressed understanding.  Recommended that patient gently massage the areas of firmness out laterally.  Discussed with her that this is most likely some scar tissue.  Patient expressed understanding.  Patient to follow back up in about 2 weeks.  I instructed her to call in the meantime if she has any questions or concerns about anything.

## 2023-05-25 ENCOUNTER — Encounter: Payer: Commercial Managed Care - PPO | Admitting: Plastic Surgery

## 2023-05-25 ENCOUNTER — Encounter: Payer: Commercial Managed Care - PPO | Admitting: Surgical

## 2023-06-02 ENCOUNTER — Ambulatory Visit: Admitting: Student

## 2023-06-02 ENCOUNTER — Telehealth: Payer: Self-pay | Admitting: *Deleted

## 2023-06-02 ENCOUNTER — Encounter: Payer: Self-pay | Admitting: Student

## 2023-06-02 VITALS — BP 122/70 | HR 79

## 2023-06-02 DIAGNOSIS — Z9889 Other specified postprocedural states: Secondary | ICD-10-CM

## 2023-06-02 NOTE — Progress Notes (Signed)
 Patient is a 28 year old female who underwent bilateral breast reduction with Dr. Carolynne Citron on 05/05/2023.  She is 4 weeks postop.  She presents to the clinic today for postoperative follow-up.     Patient was last seen in the clinic on 05/19/2023.  At this visit, patient reported she was doing well.  On exam, breasts are soft and symmetric.  There was some firmness noted to the lateral aspects of the breast bilaterally consistent with scar tissue.  There were no obvious fluid collections on exam.  NAC's were healthy.  Incisions to the right breast incision were intact and healing well.  To the left breast, there is a small superficial wound developing at the inferior T-zone.  Otherwise incisions to the left breast were healing well.  Spanish interpreter is present at bedside.  Today, patient reports she is doing well.  She states that she has had some burning pain especially to her inframammary incision and to the wound on her left side.  She states that after she does a lot of activities, she notices that she has a little bit of pain to her breasts.  She is concerned that the Vaseline may be irritating her skin.  She denies any fevers or chills.  She denies any drainage from either of her breasts.  Patient reports that she has been massaging her breasts.    Patient was also inquiring about going back to work.  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  Chaperone present on exam.  On exam, patient is sitting upright in no acute distress.  Breasts are soft and symmetric.  There is no overlying erythema.  No obvious fluid collections on exam.  NAC's appear to be healthy bilaterally.  There is a small approximately 2 cm x 1 cm x 0.25 cm wound noted to the left inferior T-zone.  There appears to be healthy tissue throughout the wound.  Remainder of the incisions are intact with some residual Dermabond.  There are no signs  of infection on exam.  I recommended that patient apply Vashe soaked gauze to her wound for about 5 minutes a day, and then apply Xeroform to her left breast wound daily.  I recommended that she try Aquaphor or she can try gentle fragrance free moisturizer such as CeraVe or Cetaphil to the remainder of her incisions if she feels like Vaseline is bothering her.  Patient expressed understanding.  Will send in [facility]  order for Xeroform.  Recommended that patient continue to massage her breasts.  Discussed with her to continue with compression.  Patient expressed understanding.  Discussed with patient that we can do another 1 to 2 weeks out of work, but after that, she will not have any restrictions.  Patient expressed understanding was in agreement with this.  Patient to follow back up in 2 weeks.  I instructed her to call if she has any questions or concerns about anything.  Pictures were obtained of the patient and placed in the chart with the patient's or guardian's permission.

## 2023-06-02 NOTE — Telephone Encounter (Signed)
 Faxed order,demographics,insurance information, and recent office notes to [facility] for supplies for the patient.    Supples:Xeroform:Daily               Mepilex:Daily  Confirmation received and copy scanned into the chart.//AB/CMA

## 2023-06-03 ENCOUNTER — Encounter: Payer: Self-pay | Admitting: Student

## 2023-06-16 ENCOUNTER — Ambulatory Visit (INDEPENDENT_AMBULATORY_CARE_PROVIDER_SITE_OTHER): Admitting: Physician Assistant

## 2023-06-16 ENCOUNTER — Encounter: Payer: Self-pay | Admitting: Physician Assistant

## 2023-06-16 VITALS — BP 116/82 | HR 89

## 2023-06-16 DIAGNOSIS — Z9889 Other specified postprocedural states: Secondary | ICD-10-CM

## 2023-06-16 NOTE — Progress Notes (Signed)
 Patient is a pleasant 28 year old female s/p bilateral breast reduction performed 05/05/2023 Dr. Carolynne Citron 05/05/2023 who presents to clinic for postoperative follow-up.  Patient was last seen in the clinic on 06/02/2023.  At this visit, patient was doing well.  On exam, patient's breasts were soft and symmetric.  NAC's were healthy.  There is a small wound noted to the left inferior T-zone.  Recommended additional follow-up.  Today, patient is accompanied by medical interpreter at bedside.  She states that she has been applying Xeroform, as directed, with good improvement of her left inferior T zone wound.  She feels as though it is getting smaller.  Overall she endorses considerable improvement in her upper back and neck discomfort prior to her breast reduction surgery.  She does request that she have work restrictions continued until her wound improves.  On exam, breasts have excellent shape and symmetry.  Mild nondiscrete firmness medially on each side, but otherwise soft throughout.  No overlying skin changes.  No palpable underlying fluid collections or crepitus.  No significant TTP.  Areolas are healthy.  Incisions CDI throughout with the exception of 2 x 1 cm superficial inferior T zone wound left breast.  Nondraining.  Scars are otherwise thin and healing appropriately.  Recommending continued Vaseline or Xeroform to the inferior T zone wound for the next 1 to 2 weeks until it fully epithelializes.  She can then transition to silicone scar gel twice daily x 3 months.  She understands that she might have some scar widening in the area of her inferior T zone wound, but should still have nice cosmesis.  She is interested in panniculectomy with Dr. Carolynne Citron and we will follow-up with him in approximately 4 months to discuss further.  Follow-up only as needed.  Picture(s) obtained of the patient and placed in the chart were with the patient's or guardian's permission.

## 2023-06-16 NOTE — Progress Notes (Cosign Needed)
 Patient is a 28 year old female who underwent bilateral breast reduction with Dr. Carolynne Citron on 05/05/2023.  Patient is 6 weeks postop.  She presents to the clinic today for postoperative follow-up.  Patient was last seen in the clinic on 06/02/2023.  At this visit, patient was doing well.  On exam, patient's breasts were soft and symmetric.  NAC's were healthy.  There is a small wound noted to the left inferior T-zone.  Transferred to another provider. See additional note

## 2023-07-09 ENCOUNTER — Ambulatory Visit: Admitting: Plastic Surgery

## 2023-07-09 ENCOUNTER — Encounter: Payer: Self-pay | Admitting: Plastic Surgery

## 2023-07-09 VITALS — BP 103/72 | HR 82 | Ht 63.0 in | Wt 185.0 lb

## 2023-07-09 DIAGNOSIS — E65 Localized adiposity: Secondary | ICD-10-CM

## 2023-07-09 DIAGNOSIS — M793 Panniculitis, unspecified: Secondary | ICD-10-CM

## 2023-07-09 NOTE — Progress Notes (Signed)
 Referring Provider Will Hare, NP [address] [place],  [place] [address]   CC:  Chief Complaint  Patient presents with   Advice Only      Audrey Munoz is an 28 y.o. female.  HPI: Audrey Munoz is a 28 year old female well-known to the office.  Her appointment is conducted with the assistance of a hospital provided interpreter.  She previously underwent a bilateral breast reduction very nice results.  She returns today with complaints of ongoing rashes in the posterior aspect of her pannus and a request for abdominal wall contouring.  Additionally she has asked whether anything can be done for the excess adipose tissue in her axillas.  She notes that the rash is over the posterior aspect of the pannus worsen sweating and she has been unsuccessful in finding treatments that have eliminated these rashes.  No Known Allergies  Outpatient Encounter Medications as of 07/09/2023  Medication Sig   Aspirin-Acetaminophen -Caffeine (EXCEDRIN PO) Take by mouth.   [DISCONTINUED] cyclobenzaprine  (FLEXERIL ) 10 MG tablet Take 1 tablet (10 mg total) by mouth 3 (three) times daily as needed for muscle spasms. (Patient not taking: Reported on 07/09/2023)   [DISCONTINUED] ketorolac  (TORADOL ) 10 MG tablet Take 1 tablet (10 mg total) by mouth every 8 (eight) hours as needed for up to 10 doses. (Patient not taking: Reported on 07/09/2023)   [DISCONTINUED] metaxalone (SKELAXIN) 800 MG tablet Take 800 mg by mouth 3 (three) times daily. (Patient not taking: Reported on 07/09/2023)   [DISCONTINUED] ondansetron  (ZOFRAN ) 4 MG tablet Take 1 tablet (4 mg total) by mouth every 8 (eight) hours as needed for nausea or vomiting. (Patient not taking: Reported on 07/09/2023)   No facility-administered encounter medications on file as of 07/09/2023.     Past Medical History:  Diagnosis Date   Family history of ovarian cancer    8/21 cancer genetic testing letter sent   History of gestational diabetes 04/22/2019    Hypertension    with pregnancy   Idiopathic intracranial hypertension    Seizures (HCC)    Seizures as a child   Syncope     Past Surgical History:  Procedure Laterality Date   BREAST REDUCTION SURGERY Bilateral 05/05/2023   Procedure: bilateral breast reduction;  Surgeon: Teretha Ferguson, MD;  Location: [facility];  Service: Plastics;  Laterality: Bilateral;   CESAREAN SECTION N/A 02/25/2019   Procedure: CESAREAN SECTION;  Surgeon: Alben Alma, MD;  Location: [facility];  Service: Obstetrics;  Laterality: N/A;   FOOT GANGLION EXCISION     Cyst removed from foot   FOOT SURGERY     LAPAROSCOPIC APPENDECTOMY N/A 11/27/2021   Procedure: APPENDECTOMY LAPAROSCOPIC;  Surgeon: Emmalene Hare, MD;  Location: [facility];  Service: General;  Laterality: N/A;   LAPAROSCOPIC OVARIAN CYSTECTOMY Right 11/27/2021   Procedure: LAPAROSCOPIC OVARIAN CYSTECTOMY;  Surgeon: Emmalene Hare, MD;  Location: [facility];  Service: General;  Laterality: Right;   LAPAROSCOPIC OVARIAN CYSTECTOMY Right 11/27/2021   Procedure: LAPAROSCOPIC OVARIAN CYSTECTOMY;  Surgeon: Teresa Fender, MD;  Location: [facility];  Service: Gynecology;  Laterality: Right;   LYSIS OF ADHESION  11/27/2021   Procedure: LYSIS OF ADHESION;  Surgeon: Emmalene Hare, MD;  Location: [facility];  Service: General;;    Family History  Problem Relation Age of Onset   Asthma Father    Seizures Sister    Ovarian cancer Maternal Grandmother    Cancer - Ovarian Maternal Grandmother    Cancer Maternal Grandmother  Diabetes Mother     Social History   Social History Narrative   Not on file     Review of Systems General: Denies fevers, chills, weight loss CV: Denies chest pain, shortness of breath, palpitations Abdomen: Excess skin and on the anterior abdominal wall causing rashes.  Physical Exam    07/09/2023   11:07 AM 06/16/2023    2:32 PM 06/02/2023   12:57 PM  Vitals with BMI  Height 5' 3    Weight 185 lbs    BMI  32.78    Systolic 103 116 865  Diastolic 72 82 70  Pulse 82 89 79    General:  No acute distress,  Alert and oriented, Non-Toxic, Normal speech and affect Abdomen: Patient has a moderate pannus which extends down to and slightly past the symphysis pubis.  She has rashes on anterior and posterior portion of the pannus.  She also has a significant amount of excess subcutaneous fat above the umbilicus. Mammogram: Not applicable Assessment/Plan Panniculitis: Patient has a moderate pannus and rashes on the posterior aspect.  As she would likely derive benefit from a panniculectomy.  We discussed the procedure at length.  I showed her the location of the incisions and we discussed what would be removed with a panniculectomy we discussed the use of drains postoperatively as well as the need for a compressive garment for 6 weeks.  The patient understands that the area above her umbilicus will not be addressed with a panniculectomy if she wants to consider any type of contouring this area she may need to lose some first.  A specific number is not given but rather we discussed the importance of decreasing the subcutaneous fat.  Additionally I believe that she would find that the area under her axilla is decreased in size with weight loss as well.  I discussed this with her.  She understands the treatment of this area can be done with liposuction however that is a cosmetic procedure not covered by insurance.  She would like to work on weight loss before we proceed with any surgery.  She will return to see me in 3 months.  Teretha Ferguson 07/09/2023, 12:51 PM

## 2023-07-10 ENCOUNTER — Encounter: Payer: Self-pay | Admitting: Physician Assistant

## 2023-07-14 ENCOUNTER — Ambulatory Visit (INDEPENDENT_AMBULATORY_CARE_PROVIDER_SITE_OTHER): Admitting: Student

## 2023-07-14 ENCOUNTER — Encounter: Payer: Self-pay | Admitting: Student

## 2023-07-14 DIAGNOSIS — Z9889 Other specified postprocedural states: Secondary | ICD-10-CM

## 2023-07-14 NOTE — Progress Notes (Signed)
 Patient is a 28 year old female who underwent bilateral breast reduction with Dr. Carolynne Citron on 05/05/2023.  She is about 2 months postop.  She presents to the clinic today with concerns about bleeding.  Patient was last seen in the clinic on 06/16/2023 in regards to her breast reduction.  At this visit, breast had excellent shape and symmetry.  There was some mild firmness noted medially on each breast.  Areolas were healthy.  Incisions were clean dry and intact throughout with the exception of 2 x 1 cm superficial inferior T-zone wound to the left breast.  It was recommended that patient continue with Vaseline or Xeroform to the inferior T-zone wound for the next 1 to 2 weeks.  Spanish interpreter is present at bedside.  Today, patient reports she is overall doing well.  She states that she started back at work yesterday and noticed that she had a little bit of bleeding from her inferior T-zone.  She states that this area was a wound before, but had healed, but started oozing a little bit yesterday.  She denies any fevers or chills.  She does reports she sometimes has a little bit of a burning sensation to her right breast and a little bit of firmness in that area.  She denies any other issues or concerns at this time.  Chaperone present on exam.  On exam, patient is sitting upright in no acute distress.  Breasts are soft and fairly symmetric.  There is no overlying erythema.  There is a little bit of firmness noted just underneath the right vertical limb incision, consistent with scar tissue versus fat necrosis.  There are no overlying skin changes.  There are no fluid collections palpated on exam.  NAC's appear to be healthy bilaterally.  There does appear to be a little bit of irritation at the inferior T-zone of the left breast.  There is no surrounding erythema or drainage.  No signs of infection.  Incisions otherwise appear to be clean dry and intact.  Discussed with the patient that the area to her left  inferior T-zone appears to be a little bit irritated.  I recommended that she apply Vaseline to this area and cover it daily while she is at work.  Patient expressed understanding.  Recommended that patient massage the area of firmness to her right breast.  I discussed with her that if it does not improve with time, she should let us  know.  Patient expressed understanding.  Patient to follow back up in about a month.  I instructed her to call if she has any questions or concerns about anything.  Pictures were obtained of the patient and placed in the chart with the patient's or guardian's permission.

## 2023-07-15 DIAGNOSIS — Z719 Counseling, unspecified: Secondary | ICD-10-CM

## 2023-08-10 ENCOUNTER — Ambulatory Visit (INDEPENDENT_AMBULATORY_CARE_PROVIDER_SITE_OTHER): Admitting: Student

## 2023-08-10 ENCOUNTER — Other Ambulatory Visit: Payer: Self-pay | Admitting: Student

## 2023-08-10 ENCOUNTER — Encounter: Payer: Self-pay | Admitting: Student

## 2023-08-10 VITALS — BP 118/72 | HR 107 | Ht 63.0 in | Wt 187.2 lb

## 2023-08-10 DIAGNOSIS — Z9889 Other specified postprocedural states: Secondary | ICD-10-CM

## 2023-08-10 DIAGNOSIS — N644 Mastodynia: Secondary | ICD-10-CM

## 2023-08-10 MED ORDER — CEPHALEXIN 500 MG PO CAPS: 500.0000 mg | ORAL_CAPSULE | Freq: Four times a day (QID) | ORAL | 0 refills | Status: AC

## 2023-08-10 NOTE — Progress Notes (Addendum)
 Referring Provider Harvey Gaetana CROME, NP [address] [place],  [place] [address]   CC:  Chief Complaint  Patient presents with   Follow-up      Audrey Munoz is an 28 y.o. female.  HPI: Patient is a 28 year old female who underwent bilateral breast reduction with Dr. Waddell on 05/05/2023.  Patient is a little over 3 months postop.  She presents to the clinic today for further postoperative follow-up.  Patient was last seen in the clinic on 07/14/2023.  At this visit, patient was overall doing well.  On exam, breasts were soft and symmetric.  There was a little bit of firmness noted just underneath the right vertical limb incision consistent with scar tissue versus fat necrosis.  NAC's were healthy.  There was a little bit of irritation noted at the inferior T-zone of the left breast.  No signs of infection.  Spanish interpreter is present at bedside.  Today, patient reports she is doing okay.  She states that she has noticed some more firmness to her right breast as well as pain to her right breast.  She denies any fevers.  She also reports the right lateral scar has been a little bit painful.  She denies any other issues or concerns.  Review of Systems General: Denies any fevers or chills.  Physical Exam    08/10/2023    2:49 PM 07/09/2023   11:07 AM 06/16/2023    2:32 PM  Vitals with BMI  Height 5' 3 5' 3   Weight 187 lbs 3 oz 185 lbs   BMI 33.17 32.78   Systolic 118 103 883  Diastolic 72 72 82  Pulse 107 82 89    General:  No acute distress,  Alert and oriented, Non-Toxic, Normal speech and affect Chaperone present on exam.  On exam, patient is sitting upright in no acute distress.  Breasts are overall soft and symmetric.  There is a little bit of erythema noted over the right breast just lateral to the NAC.  It does not appear to be cellulitic.  There is some firmness noted to the right breast, consistent with scar tissue.  There is some mild tenderness to palpation  diffusely.  Incisions overall appear to be well-healed bilaterally, the lateral inframammary incision on the right side does appear to be a little bit raised.  NAC's are healthy bilaterally.  There are no fluid collections palpated to either breast.  Assessment/Plan  S/P bilateral breast reduction - Plan: US  LIMITED ULTRASOUND INCLUDING AXILLA RIGHT BREAST, CANCELED: US  BREAST COMPLETE UNI RIGHT INC AXILLA   Recommended to the patient that given the pain she was experiencing on exam and the small amount of redness to her right breast, we start her on an antibiotic.  She denies any allergies to penicillin, we will go ahead and start penicillin.  Also recommended we get an ultrasound given pain and the firmness.  Patient expressed understanding.  Recommended that patient continue with scar therapies to her incision and continue to massage her incisions.  Discussed with the patient that if she notices any worsening pain, redness, swelling, fevers or chills she needs to call us .  Patient expressed understanding.  Discussed with patient she may take Tylenol  or ibuprofen  for her pain.  We will have the patient follow back up next week.  Instructed her to call in the meantime if she has any questions or concerns about anything.  Pictures were obtained of the patient and placed in the chart  with the patient's or guardian's permission.   [name] 08/10/2023, 3:37 PM

## 2023-08-13 ENCOUNTER — Ambulatory Visit
Admission: RE | Admit: 2023-08-13 | Discharge: 2023-08-13 | Disposition: A | Discharge status: Home / Self Care | Source: Ambulatory Visit | Attending: Student | Admitting: Student

## 2023-08-13 ENCOUNTER — Ambulatory Visit: Admitting: Student

## 2023-08-13 DIAGNOSIS — Z9889 Other specified postprocedural states: Secondary | ICD-10-CM

## 2023-08-14 ENCOUNTER — Ambulatory Visit: Payer: Self-pay | Admitting: Student

## 2023-08-14 NOTE — Telephone Encounter (Signed)
 Spanish interpreter was utilized during today's phone call.  I called the patient with the results of her ultrasound.  I discussed with her that it appears she may have mastitis.  Patient was switched to Augmentin , she states that she has been taking this.  I recommended that she continue to take this.  I also recommended that she call her primary care provider or gynecologist today to let them know that she is suspected to have mastitis.  She expressed understanding.  All of her questions were answered to her satisfaction.  I discussed with the patient that I would still like to see her on Monday for follow-up.  She expressed understanding.  I discussed with the patient that she should closely monitor her breast and if she notices any worsening pain, redness, swelling, fevers, chills she should be evaluated in the emergency room.  Patient expressed understanding.

## 2023-08-17 ENCOUNTER — Ambulatory Visit (INDEPENDENT_AMBULATORY_CARE_PROVIDER_SITE_OTHER): Admitting: Student

## 2023-08-17 DIAGNOSIS — N61 Mastitis without abscess: Secondary | ICD-10-CM | POA: Diagnosis not present

## 2023-08-17 DIAGNOSIS — Z719 Counseling, unspecified: Secondary | ICD-10-CM

## 2023-08-17 DIAGNOSIS — Z9889 Other specified postprocedural states: Secondary | ICD-10-CM

## 2023-08-17 NOTE — Progress Notes (Signed)
   Referring Provider Audrey Gaetana CROME, NP [address] [place],  [place] [address]   CC:  Chief Complaint  Patient presents with   Post-op Follow-up      Audrey Munoz is an 28 y.o. female.  HPI: Patient is a 28 year old female who underwent bilateral breast reduction with Dr. Waddell on 05/05/2023.  Patient is a little over 3 months postop.  She presents to the clinic today for further postoperative follow-up   Patient was last seen in the clinic on 08/10/2023.  At this visit, breasts were overall soft and symmetric.  There was some erythema over the right breast and there was some firmness to the right breast consistent with scar tissue.  There is mild tenderness to palpation diffusely.  Ultrasound was ordered for the patient and patient was placed on antibiotics.  Ultrasound was consistent with mastitis.  Patient was switched to Augmentin .  It was recommended that she call her PCP or her gynecologist to let them know about the mastitis.  She expressed understanding.  Spanish interpreter present at bedside.  Today, patient reports she is doing better.  She states that the pain is much better.  She states she has been taking the antibiotic as instructed.  She denies any fevers or chills.  She denies any drainage from her breast.  She denies any other issues or concerns.  Patient states that she did contact her PCP in regards to her mastitis.  Review of Systems General: Denies any fevers or chills  Physical Exam    08/10/2023    2:49 PM 07/09/2023   11:07 AM 06/16/2023    2:32 PM  Vitals with BMI  Height 5' 3 5' 3   Weight 187 lbs 3 oz 185 lbs   BMI 33.17 32.78   Systolic 118 103 883  Diastolic 72 72 82  Pulse 107 82 89    General:  No acute distress,  Alert and oriented, Non-Toxic, Normal speech and affect Chaperone present on exam.  On exam, patient is sitting upright in no acute distress.  Breasts are overall soft and symmetric.  There was some firmness noted to the  right breast, this has significantly softened up since previous exam.  There is no overlying erythema to either breast.  NAC's are healthy bilaterally.  Incisions are overall well-healed, there are some raised scars noted to the lateral aspects of the inframammary incision.  There is no significant tenderness to palpation.  No overt signs of infection on exam.  Assessment/Plan  S/P bilateral breast reduction   Recommended that patient finish out her antibiotics that was prescribed to her.  Recommended that she gently massage the areas of firmness as well as the raised scars.  Recommended that she start scar creams, silicone-based creams or tapes to the scars.  Patient expressed understanding.  Patient to follow back up in 2 weeks.  I instructed her to call in the meantime if she has any questions or concerns about anything.  Audrey Munoz 08/17/2023, 2:49 PM

## 2023-08-26 ENCOUNTER — Ambulatory Visit (INDEPENDENT_AMBULATORY_CARE_PROVIDER_SITE_OTHER): Admitting: Plastic Surgery

## 2023-08-26 ENCOUNTER — Encounter: Payer: Self-pay | Admitting: Plastic Surgery

## 2023-08-26 VITALS — HR 73 | Ht 63.0 in | Wt 193.0 lb

## 2023-08-26 DIAGNOSIS — E65 Localized adiposity: Secondary | ICD-10-CM

## 2023-08-26 DIAGNOSIS — N61 Mastitis without abscess: Secondary | ICD-10-CM

## 2023-08-26 MED ORDER — AMOXICILLIN-POT CLAVULANATE 875-125 MG PO TABS: 1.0000 | ORAL_TABLET | Freq: Two times a day (BID) | ORAL | 0 refills | Status: DC

## 2023-08-26 NOTE — Progress Notes (Signed)
 [name] today for a weight check and reevaluation for possible abdominoplasty.  She is also been seen recently for discomfort in her right breast after breast reduction.  An ultrasound was consistent with mastitis.  She saw her primary care doctor who recommended a an extended course of Augmentin  but [name] says that this was denied by her insurance company.  She is still interested in abdominal wall contouring however her weight has increased slightly since her last evaluation.  On physical examination the breasts look quite good but she does have discomfort in the upper outer quadrant of the right breast.  Given the findings on the ultrasound and the recommendation of her primary care physician that she continue the Augmentin  I will reorder this as necessary for treatment of mastitis.  As far as her abdomen I still believe that she is an acceptable candidate for panniculectomy but she would not get the result that she is hoping for with an abdominoplasty.  We discussed the fact that she needs to lose subcutaneous fat and I cannot give her a firm number as to how much weight she should lose.  We discussed weight loss strategies which include consistent walking preferably of up to an hour per day.  She should also include resistance training and we discussed weightlifting but if she is not interested in this thin body weight exercises and yoga both of which can be found on YouTube.  We discussed diet primarily of vegetables with good quality protein.  She should avoid highly processed carbohydrates such as cookies crackers cake and breads.  She understands and will follow-up in 3 months.  I have asked her to return sooner if her breast discomfort has not improved substantially.  The entire appointment was conducted through a Spanish interpreter provided by the hospital.

## 2023-08-31 ENCOUNTER — Ambulatory Visit: Admitting: Student

## 2023-10-08 ENCOUNTER — Encounter (INDEPENDENT_AMBULATORY_CARE_PROVIDER_SITE_OTHER): Payer: Self-pay

## 2023-10-12 ENCOUNTER — Ambulatory Visit: Admitting: Plastic Surgery

## 2023-10-20 NOTE — Progress Notes (Addendum)
   Referring Provider Harvey Gaetana CROME, NP [address] [place],  [place] [address]   CC: No chief complaint on file.     Audrey Munoz is an 28 y.o. female.  HPI: ***  Review of Systems General: ***  Physical Exam    08/26/2023    9:28 AM 08/10/2023    2:49 PM 07/09/2023   11:07 AM  Vitals with BMI  Height 5' 3 5' 3 5' 3  Weight 193 lbs 187 lbs 3 oz 185 lbs  BMI 34.2 33.17 32.78  Systolic -- 118 103  Diastolic -- 72 72  Pulse 73 107 82    General:  No acute distress,  Alert and oriented, Non-Toxic, Normal speech and affect ***   Assessment/Plan ***  Audrey Munoz 10/20/2023, 11:02 AM

## 2023-10-21 ENCOUNTER — Ambulatory Visit (INDEPENDENT_AMBULATORY_CARE_PROVIDER_SITE_OTHER): Admitting: Student

## 2023-10-21 ENCOUNTER — Encounter: Payer: Self-pay | Admitting: Student

## 2023-10-21 VITALS — BP 127/83 | HR 69 | Ht 63.0 in | Wt 187.6 lb

## 2023-10-21 DIAGNOSIS — E65 Localized adiposity: Secondary | ICD-10-CM

## 2023-10-21 DIAGNOSIS — R21 Rash and other nonspecific skin eruption: Secondary | ICD-10-CM | POA: Diagnosis not present

## 2023-10-21 DIAGNOSIS — M793 Panniculitis, unspecified: Secondary | ICD-10-CM | POA: Diagnosis not present

## 2023-10-21 DIAGNOSIS — Z6833 Body mass index (BMI) 33.0-33.9, adult: Secondary | ICD-10-CM

## 2023-10-21 NOTE — Addendum Note (Signed)
 Addended by: ANDRIS STAGGER on: 10/21/2023 11:39 AM   Modules accepted: Orders

## 2023-11-10 ENCOUNTER — Institutional Professional Consult (permissible substitution) (INDEPENDENT_AMBULATORY_CARE_PROVIDER_SITE_OTHER): Admitting: Nurse Practitioner

## 2023-11-19 ENCOUNTER — Institutional Professional Consult (permissible substitution): Admitting: Plastic Surgery

## 2023-11-25 ENCOUNTER — Ambulatory Visit: Admitting: Student

## 2023-11-26 ENCOUNTER — Encounter (INDEPENDENT_AMBULATORY_CARE_PROVIDER_SITE_OTHER): Payer: Self-pay | Admitting: Nurse Practitioner

## 2023-11-26 ENCOUNTER — Institutional Professional Consult (permissible substitution) (INDEPENDENT_AMBULATORY_CARE_PROVIDER_SITE_OTHER): Admitting: Nurse Practitioner

## 2023-11-26 ENCOUNTER — Encounter (INDEPENDENT_AMBULATORY_CARE_PROVIDER_SITE_OTHER): Payer: Self-pay

## 2023-11-26 NOTE — Progress Notes (Deleted)
 [address] [place], [place], [place] [address] Office: [phone]  /  Fax: [phone]   Initial Consultation    Audrey Munoz was seen in clinic today to evaluate for obesity. She is interested in losing weight to improve overall health and reduce the risk of weight related complications. She presents today to review program treatment options, initial physical assessment, and evaluation.    Anthropometrics and Bioimpedance Analysis   There is no height or weight on file to calculate BMI. Body Fat Mass : *** % Visceral Fat Mass Rating : ***   Obesity Related Diseases and Complications  Obesity Quality of Life and Psychosocial Complications: {emqolpsychosoc:33006::Reduced health-related quality of life}  Cardiometabolic: {emcardiometabolic complications:33007}  Biomechanical: {embiomechanical:33008}   Weight Related History  She was referred by: {emreferby:28303}  When asked what they would like to accomplish? She states: {EMHopetoaccomplish:28304::Adopt a healthier eating pattern and lifestyle,Improve energy levels and physical activity,Improve existing medical conditions,Improve quality of life}  Weight history: ***  Highest weight: ***  Contributing factors: {EMcontributingfactors:28307}  Prior weight loss attempts: {emweightlossprograms:31590::None}  Current or previous pharmacotherapy: {EM previousRx:28311}  Response to medication: {EMResponsetomedication:28312}  Current nutrition plan: {EMNutritionplan:28309::None}  Greatest challenge with dieting: {emgreatestchallengediet:31593}.  Current level of physical activity: {EMcurrentPA:28310::None}  Barriers to Exercise: {embarrierstoexercise:32606::no barriers}  Readiness and Motivation  On a scale from 0 to 10 How ready are you to make changes to your eating and physical activity to lose weight? {NUMBER 1-10:22536} How important is it for you to lose weight right now ? {NUMBER  1-10:22536} How confident are you that you can lose weight if you try? {NUMBER [id number]  Past Medical History   Past Medical History:  Diagnosis Date   Family history of ovarian cancer    8/21 cancer genetic testing letter sent   History of gestational diabetes 04/22/2019   Hypertension    with pregnancy   Idiopathic intracranial hypertension    Seizures (HCC)    Seizures as a child   Syncope      Objective    There were no vitals taken for this visit. She was weighed on the bioimpedance scale: There is no height or weight on file to calculate BMI.    General:  Alert, oriented and cooperative. Patient is in no acute distress.  Respiratory: Normal respiratory effort, no problems with respiration noted   Gait: able to ambulate independently  Mental Status: Normal mood and affect. Normal behavior. Normal judgment and thought content.   Diagnostic Data Reviewed  BMET    Component Value Date/Time   NA 136 05/10/2023 2024   NA 137 08/05/2018 1639   K 4.3 05/10/2023 2024   CL 102 05/10/2023 2024   CO2 26 05/10/2023 2024   GLUCOSE 122 (H) 05/10/2023 2024   BUN 13 05/10/2023 2024   BUN 8 08/05/2018 1639   CREATININE 0.69 05/10/2023 2024   CALCIUM 8.8 (L) 05/10/2023 2024   GFRNONAA >60 05/10/2023 2024   GFRAA >60 08/28/2019 1016   Lab Results  Component Value Date   HGBA1C 5.4 08/05/2018   No results found for: INSULIN CBC    Component Value Date/Time   WBC 10.6 (H) 05/10/2023 2024   RBC 3.57 (L) 05/10/2023 2024   HGB 10.7 (L) 05/10/2023 2024   HGB 10.9 (L) 01/17/2019 1125   HCT 32.2 (L) 05/10/2023 2024   HCT 32.5 (L) 01/17/2019 1125   PLT 324 05/10/2023 2024   PLT 288 08/05/2018 1639   MCV 90.2 05/10/2023 2024   MCV 82 01/17/2019 1125  MCH 30.0 05/10/2023 2024   MCHC 33.2 05/10/2023 2024   RDW 11.7 05/10/2023 2024   RDW 12.1 01/17/2019 1125   Iron/TIBC/Ferritin/ %Sat No results found for: IRON, TIBC, FERRITIN, IRONPCTSAT Lipid Panel  No  results found for: CHOL, TRIG, HDL, CHOLHDL, VLDL, LDLCALC, LDLDIRECT Hepatic Function Panel     Component Value Date/Time   PROT 7.6 11/26/2021 1317   PROT 6.9 08/05/2018 1639   ALBUMIN 4.0 11/26/2021 1317   ALBUMIN 4.3 08/05/2018 1639   AST 16 11/26/2021 1317   ALT 14 11/26/2021 1317   ALKPHOS 69 11/26/2021 1317   BILITOT 0.5 11/26/2021 1317   BILITOT 0.2 08/05/2018 1639      Component Value Date/Time   TSH 0.679 10/15/2018 1045    Medications  Outpatient Encounter Medications as of 11/26/2023  Medication Sig   amoxicillin -clavulanate (AUGMENTIN ) 875-125 MG tablet Take 1 tablet by mouth 2 (two) times daily.   Aspirin-Acetaminophen -Caffeine (EXCEDRIN PO) Take by mouth.   No facility-administered encounter medications on file as of 11/26/2023.     Assessment and Plan   Idiopathic intracranial hypertension       Obesity Treatment and Action Plan:  {EMobesityactionplanscribe:28314::Patient will work on garnering support from family and friends to begin weight loss journey.,Will work on eliminating or reducing the presence of highly palatable, calorie dense foods in the home.,Will complete provided nutritional and psychosocial assessment questionnaire before the next appointment.,Will be scheduled for indirect calorimetry to determine resting energy expenditure in a fasting state.  This will allow us  to create a reduced calorie, high-protein meal plan to promote loss of fat mass while preserving muscle mass.,Counseled on the health benefits of losing 5%-15% of total body weight.,Was counseled on nutritional approaches to weight loss and benefits of reducing processed foods and consuming plant-based foods and high quality protein as part of nutritional weight management.,Was counseled on pharmacotherapy and role as an adjunct in weight management. }  Education and Additional resources  She was weighed on the bioimpedance scale and results were  discussed and documented in the synopsis.  We discussed obesity as a progressive, chronic disease and the importance of a more detailed evaluation of all the factors contributing to the disease.  We reviewed the basic principles in obesity management.   We discussed the importance of long term lifestyle changes which include nutrition, exercise and behavioral modification as well as the importance of customizing this to her specific health and social needs.  We reviewed the role of medical interventions including pharmacotherapy and surgical interventions.   We discussed the benefits of reaching a healthier weight to alleviate the symptoms of existing conditions and reduce the risks of the biomechanical, cardiometabolic and psychological effects of obesity.  We reviewed our program approach and philosophy, which are guided by the four pillars of obesity medicine.  We discussed how to prepare for intake appointment and the importance of fasting and avoidance of stimulants for at least 8 hours prior to indirect calorimetry.  Audrey Munoz appears to be in the action stage of change and reports being ready to initiate intensive lifestyle and behavioral modifications as part of their weight loss journey.  Attestation  Reviewed by clinician on day of visit: allergies, medications, problem list, medical history, surgical history, family history, social history, and previous encounter notes pertinent to obesity diagnosis.  I have spent *** minutes in the care of the patient today including: {NUMBER 1-10:22536} minutes before the visit reviewing and preparing the chart. *** minutes face-to-face {emfacetoface:32598::assessing and reviewing  listed medical problems as outlined in obesity care plan,providing nutritional and behavioral counseling on topics outlined in the obesity care plan,independently interpreting test results and goals of care, as described in assessment and plan,reviewing  and discussing biometric information and progress} {NUMBER 1-10:22536} minutes after the visit updating chart and documentation of encounter.  Aliyyah Riese ANP-C

## 2024-01-05 HISTORY — PX: ABDOMINOPLASTY: SUR9

## 2024-02-10 ENCOUNTER — Ambulatory Visit
Admission: EM | Admit: 2024-02-10 | Discharge: 2024-02-10 | Disposition: A | Discharge status: Home / Self Care | Attending: Emergency Medicine | Admitting: Emergency Medicine

## 2024-02-10 DIAGNOSIS — T8130XA Disruption of wound, unspecified, initial encounter: Secondary | ICD-10-CM | POA: Diagnosis not present

## 2024-02-10 MED ORDER — CEPHALEXIN 500 MG PO CAPS: 500.0000 mg | ORAL_CAPSULE | Freq: Four times a day (QID) | ORAL | 0 refills | Status: AC

## 2024-02-10 NOTE — Discharge Instructions (Addendum)
 Take the cephalexin  as directed.  Follow the wound care instructions given by your surgeon.    Follow-up with your surgeon in [place] tomorrow or with the on-call surgeon here in [place] listed below.

## 2024-02-10 NOTE — ED Provider Notes (Signed)
 " CAY RALPH PELT    CSN: [id number] Arrival date & time: 02/10/24  1604      History   Chief Complaint Chief Complaint  Patient presents with   Wound Dehiscence    HPI Audrey Munoz is a 29 y.o. female.  Accompanied by her friend, patient presents with opening of a surgical wound in her lower abdomen.  Patient had a tummy tuck in [place] on 01/05/2024 at a private clinic there.  Approximately 2 weeks after the surgery, patient noticed 2 open areas in her surgical wound which have had some light yellow drainage.  She states the symptoms are improving but wanted to get them checked today.  She has not seen the surgeon in [place] since the surgery.  She denies fever or chills.  She has been following the wound care instructions that she was given upon discharge; she has been wearing her abdominal binder and dressing the wound with Xeroform gauze and nonstick dressing.  The history is provided by the patient, a friend and medical records. No language interpreter was used (Patient offered medical interpreter but she declines today stating she would rather have her friend who is with her translate for her.).    Past Medical History:  Diagnosis Date   Family history of ovarian cancer    8/21 cancer genetic testing letter sent   History of gestational diabetes 04/22/2019   Hypertension    with pregnancy   Idiopathic intracranial hypertension    Seizures (HCC)    Seizures as a child   Syncope     Patient Active Problem List   Diagnosis Date Noted   Pelvic adhesions 11/27/2021   Acute appendicitis with localized peritonitis, without perforation, abscess, or gangrene    Ovarian cyst 11/26/2021   Abdominal pain    Idiopathic intracranial hypertension 10/22/2018   Obesity (BMI 30.0-34.9) 08/05/2018    Past Surgical History:  Procedure Laterality Date   ABDOMINAL SURGERY     ABDOMINOPLASTY  01/05/2024   BREAST REDUCTION SURGERY Bilateral 05/05/2023   Procedure: bilateral  breast reduction;  Surgeon: Waddell Leonce NOVAK, MD;  Location: [facility];  Service: Plastics;  Laterality: Bilateral;   CESAREAN SECTION N/A 02/25/2019   Procedure: CESAREAN SECTION;  Surgeon: Arloa Lamar SQUIBB, MD;  Location: [facility];  Service: Obstetrics;  Laterality: N/A;   FOOT GANGLION EXCISION     Cyst removed from foot   FOOT SURGERY     LAPAROSCOPIC APPENDECTOMY N/A 11/27/2021   Procedure: APPENDECTOMY LAPAROSCOPIC;  Surgeon: Desiderio Schanz, MD;  Location: [facility];  Service: General;  Laterality: N/A;   LAPAROSCOPIC OVARIAN CYSTECTOMY Right 11/27/2021   Procedure: LAPAROSCOPIC OVARIAN CYSTECTOMY;  Surgeon: Desiderio Schanz, MD;  Location: [facility];  Service: General;  Laterality: Right;   LAPAROSCOPIC OVARIAN CYSTECTOMY Right 11/27/2021   Procedure: LAPAROSCOPIC OVARIAN CYSTECTOMY;  Surgeon: Connell Davies, MD;  Location: [facility];  Service: Gynecology;  Laterality: Right;   LYSIS OF ADHESION  11/27/2021   Procedure: LYSIS OF ADHESION;  Surgeon: Desiderio Schanz, MD;  Location: [facility];  Service: General;;    OB History     Gravida  4   Para  1   Term  1   Preterm  0   AB  3   Living  1      SAB  2   IAB  1   Ectopic  0   Multiple  0   Live Births  1  Home Medications    Prior to Admission medications  Medication Sig Start Date End Date Taking? Authorizing Provider  cephALEXin  (KEFLEX ) 500 MG capsule Take 1 capsule (500 mg total) by mouth 4 (four) times daily. 02/10/24  Yes Corlis Burnard DEL, NP  Aspirin-Acetaminophen -Caffeine (EXCEDRIN PO) Take by mouth.    [provider]    Family History Family History  Problem Relation Age of Onset   Asthma Father    Seizures Sister    Ovarian cancer Maternal Grandmother    Cancer - Ovarian Maternal Grandmother    Cancer Maternal Grandmother    Diabetes Mother     Social History Social History[1]   Allergies   Povidone iodine   Review of Systems Review of Systems   Constitutional:  Negative for chills and fever.  Skin:  Positive for wound. Negative for color change.     Physical Exam Triage Vital Signs ED Triage Vitals  Encounter Vitals Group     BP 02/10/24 1700 118/82     Girls Systolic BP Percentile --      Girls Diastolic BP Percentile --      Boys Systolic BP Percentile --      Boys Diastolic BP Percentile --      Pulse Rate 02/10/24 1700 63     Resp 02/10/24 1700 18     Temp 02/10/24 1700 98.4 F (36.9 C)     Temp src --      SpO2 02/10/24 1700 98 %     Weight --      Height --      Head Circumference --      Peak Flow --      Pain Score 02/10/24 1715 5     Pain Loc --      Pain Education --      Exclude from Growth Chart --    No data found.  Updated Vital Signs BP 118/82   Pulse 63   Temp 98.4 F (36.9 C)   Resp 18   LMP 01/05/2024   SpO2 98%   Visual Acuity Right Eye Distance:   Left Eye Distance:   Bilateral Distance:    Right Eye Near:   Left Eye Near:    Bilateral Near:     Physical Exam Constitutional:      General: She is not in acute distress. HENT:     Mouth/Throat:     Mouth: Mucous membranes are moist.  Cardiovascular:     Rate and Rhythm: Normal rate.  Pulmonary:     Effort: Pulmonary effort is normal. No respiratory distress.  Skin:    General: Skin is warm and dry.     Findings: No erythema.     Comments: Surgical wound across entire lower abdomen.  Two larger and one smaller open areas with scant light yellow drainage.  See pictures.  Neurological:     Mental Status: She is alert.         UC Treatments / Results  Labs (all labs ordered are listed, but only abnormal results are displayed) Labs Reviewed - No data to display  EKG   Radiology No results found.  Procedures Procedures (including critical care time)  Medications Ordered in UC Medications - No data to display  Initial Impression / Assessment and Plan / UC Course  I have reviewed the triage vital signs and  the nursing notes.  Pertinent labs & imaging results that were available during my care of the patient were reviewed  by me and considered in my medical decision making (see chart for details).    Wound dehiscence.  Afebrile and vital signs are stable.  Patient had a tummy tuck in [place] on 01/05/2024.  She has not seen the surgeon since the surgery.  The wound edges opened approximately 2 weeks after the surgery and patient has noted scant yellow drainage.  No fever or chills.  Treating today with cephalexin .  Instructed patient to continue wound care as instructed by her surgeon in [place].  Instructed her to follow-up with her surgeon in [facility] tomorrow or to follow-up with the on-call surgeon here in [place] tomorrow.  ED precautions given.  Education provided on wound dehiscence.  She agrees to plan of care.  Final Clinical Impressions(s) / UC Diagnoses   Final diagnoses:  Wound dehiscence     Discharge Instructions      Take the cephalexin  as directed.  Follow the wound care instructions given by your surgeon.    Follow-up with your surgeon in [place] tomorrow or with the on-call surgeon here in [place] listed below.     ED Prescriptions     Medication Sig Dispense Auth. Provider   cephALEXin  (KEFLEX ) 500 MG capsule Take 1 capsule (500 mg total) by mouth 4 (four) times daily. 28 capsule Corlis Burnard DEL, NP      PDMP not reviewed this encounter.    [1]  Social History Tobacco Use   Smoking status: Former    Current packs/day: 0.00    Average packs/day: 0.5 packs/day    Types: Cigarettes    Quit date: 07/16/2018    Years since quitting: 5.5   Smokeless tobacco: Never   Tobacco comments:    3 per day  Vaping Use   Vaping status: Never Used  Substance Use Topics   Alcohol use: Not Currently    Comment: Last ETOH 03/12/18   Drug use: Never     Corlis Burnard DEL, NP 02/10/24 1811  "

## 2024-02-10 NOTE — ED Triage Notes (Addendum)
 Patient to Urgent Care with complaints of wound dehiscence   Reports she had a tummy tuck 12/9 in [place]. Two weeks after surgery she noticed multiple places where the wound seemed to be opening up. Some light yellow drainage.
